# Patient Record
Sex: Female | Born: 1980
Health system: Southern US, Community
[De-identification: ages and names within clinical notes are randomized; demographics above are authoritative.]

## PROBLEM LIST (undated history)

## (undated) DIAGNOSIS — H6690 Otitis media, unspecified, unspecified ear: Secondary | ICD-10-CM

## (undated) DIAGNOSIS — G43909 Migraine, unspecified, not intractable, without status migrainosus: Secondary | ICD-10-CM

## (undated) DIAGNOSIS — F419 Anxiety disorder, unspecified: Secondary | ICD-10-CM

## (undated) DIAGNOSIS — F32A Depression, unspecified: Secondary | ICD-10-CM

## (undated) DIAGNOSIS — K219 Gastro-esophageal reflux disease without esophagitis: Secondary | ICD-10-CM

## (undated) DIAGNOSIS — O99210 Obesity complicating pregnancy, unspecified trimester: Secondary | ICD-10-CM

## (undated) DIAGNOSIS — Z87442 Personal history of urinary calculi: Secondary | ICD-10-CM

## (undated) DIAGNOSIS — A6000 Herpesviral infection of urogenital system, unspecified: Secondary | ICD-10-CM

## (undated) DIAGNOSIS — I10 Essential (primary) hypertension: Secondary | ICD-10-CM

## (undated) DIAGNOSIS — T7840XA Allergy, unspecified, initial encounter: Secondary | ICD-10-CM

## (undated) DIAGNOSIS — Z8489 Family history of other specified conditions: Secondary | ICD-10-CM

## (undated) DIAGNOSIS — F329 Major depressive disorder, single episode, unspecified: Secondary | ICD-10-CM

## (undated) HISTORY — DX: Migraine, unspecified, not intractable, without status migrainosus: G43.909

## (undated) HISTORY — PX: HERNIA REPAIR: SHX51

## (undated) HISTORY — DX: Obesity complicating pregnancy, unspecified trimester: O99.210

## (undated) HISTORY — DX: Gastro-esophageal reflux disease without esophagitis: K21.9

## (undated) HISTORY — DX: Morbid (severe) obesity due to excess calories: E66.01

## (undated) HISTORY — DX: Major depressive disorder, single episode, unspecified: F32.9

## (undated) HISTORY — DX: Herpesviral infection of urogenital system, unspecified: A60.00

## (undated) HISTORY — DX: Essential (primary) hypertension: I10

## (undated) HISTORY — DX: Anxiety disorder, unspecified: F41.9

## (undated) HISTORY — DX: Allergy, unspecified, initial encounter: T78.40XA

## (undated) HISTORY — DX: Depression, unspecified: F32.A

## (undated) HISTORY — PX: TUBAL LIGATION: SHX77

---

## 2008-10-23 ENCOUNTER — Ambulatory Visit: Payer: Self-pay | Admitting: Family Medicine

## 2008-10-23 DIAGNOSIS — A6000 Herpesviral infection of urogenital system, unspecified: Secondary | ICD-10-CM | POA: Insufficient documentation

## 2008-10-23 DIAGNOSIS — I10 Essential (primary) hypertension: Secondary | ICD-10-CM | POA: Insufficient documentation

## 2008-10-23 DIAGNOSIS — G43009 Migraine without aura, not intractable, without status migrainosus: Secondary | ICD-10-CM | POA: Insufficient documentation

## 2008-10-23 DIAGNOSIS — K219 Gastro-esophageal reflux disease without esophagitis: Secondary | ICD-10-CM | POA: Insufficient documentation

## 2008-11-24 ENCOUNTER — Ambulatory Visit: Payer: Self-pay | Admitting: Family Medicine

## 2009-03-03 ENCOUNTER — Ambulatory Visit: Payer: Self-pay | Admitting: Ophthalmology

## 2010-02-07 ENCOUNTER — Telehealth (INDEPENDENT_AMBULATORY_CARE_PROVIDER_SITE_OTHER): Payer: Self-pay | Admitting: *Deleted

## 2010-02-11 ENCOUNTER — Telehealth: Payer: Self-pay | Admitting: Family Medicine

## 2010-02-15 ENCOUNTER — Encounter: Payer: Self-pay | Admitting: Family Medicine

## 2010-02-15 ENCOUNTER — Other Ambulatory Visit: Payer: Self-pay | Admitting: Family Medicine

## 2010-02-15 ENCOUNTER — Ambulatory Visit (INDEPENDENT_AMBULATORY_CARE_PROVIDER_SITE_OTHER): Payer: Self-pay | Admitting: Family Medicine

## 2010-02-15 DIAGNOSIS — N949 Unspecified condition associated with female genital organs and menstrual cycle: Secondary | ICD-10-CM

## 2010-02-15 DIAGNOSIS — K219 Gastro-esophageal reflux disease without esophagitis: Secondary | ICD-10-CM

## 2010-02-15 DIAGNOSIS — N925 Other specified irregular menstruation: Secondary | ICD-10-CM

## 2010-02-15 DIAGNOSIS — I10 Essential (primary) hypertension: Secondary | ICD-10-CM

## 2010-02-17 NOTE — Progress Notes (Signed)
Summary: Methyldopa  Phone Note Refill Request Message from:  Fax from Pharmacy on February 07, 2010 4:57 PM  Refills Requested: Medication #1:  METHYLDOPA 250 MG TABS 1 by mouth two times a day. [address]  Phone:   [phone]   Method Requested: Electronic Initial call taken by: Delilah Shan CMA Duncan Dull),  February 07, 2010 4:58 PM  Follow-up for Phone Call        return ov needed, no ov in > 1 year Follow-up by: [name] MD,  February 07, 2010 5:13 PM  Additional Follow-up for Phone Call Additional follow up Details #1::        number disconnected so notified pharmacy and asked them to notified the patient.Consuello Masse CMA   Additional Follow-up by: Benny Lennert CMA Duncan Dull),  February 08, 2010 7:57 AM

## 2010-02-17 NOTE — Progress Notes (Signed)
Summary: wants refill on methyldopa  Phone Note From Pharmacy   Caller: [phone] Summary of Call: Patient is at pharmacy now. She is asking if she could get refill on her methyldopa, has been seen since 2010, is it okay to fill?  Initial call taken by: Melody Comas,  February 11, 2010 9:32 AM  Follow-up for Phone Call        See her primary MDs response on 2/6 OV note. Follow-up by: Kerby Nora MD,  February 11, 2010 9:57 AM  Additional Follow-up for Phone Call Additional follow up Details #1::        Pharmacy advised.Consuello Masse CMA   Additional Follow-up by: Benny Lennert CMA Duncan Dull),  February 11, 2010 10:01 AM

## 2010-02-23 NOTE — Assessment & Plan Note (Signed)
Summary: RENEW MEDS   Vital Signs:  Patient profile:   30 year old female Height:      61 inches Weight:      250.75 pounds BMI:     47.55 Temp:     98.7 degrees F oral Pulse rate:   80 / minute Pulse rhythm:   regular BP sitting:   140 / 90  (left arm) Cuff size:   regular  Vitals Entered By: Benny Lennert CMA Duncan Dull) (February 15, 2010 3:40 PM)  History of Present Illness: Chief complaint renew meds  HTN, moderate control on methyldopa (was placed on this because trying to get pregnant)... BP elevated borderline here today. per pt... at home 135/77.  Today she is 2 weeks late with menses today... U preg are neg.  Having some abdominal cramping, mild tenderness, mild breast tenderness, no N/V.  Last seen by Dr. Patsy Lager in 2010....last CPX at GYN  in 2010.   Also having daily.. burning in chest after mealss, cannot get food down sometime. Sour taste in mouth. Has tried generic zantac, prilosec 2 tabs two times a day, TUMs no relief. Used for 1 month at a time without relief.    Problems Prior to Update: 1)  Family History Diabetes 1st Degree Relative  (ICD-V18.0) 2)  Family History of Colon Ca 1st Degree Relative <60  (ICD-V16.0) 3)  Family History Breast Cancer 1st Degree Relative <50  (ICD-V16.3) 4)  Family History of Alcoholism/addiction  (ICD-V61.41) 5)  Genital Herpes  (ICD-054.10) 6)  Uti  (ICD-599.0) 7)  Common Migraine  (ICD-346.10) 8)  Hypertension  (ICD-401.9) 9)  Gerd  (ICD-530.81) 10)  Depression  (ICD-311) 11)  Chickenpox, Hx of  (ICD-V15.9)  Current Medications (verified): 1)  Methyldopa 250 Mg Tabs (Methyldopa) .Marland Kitchen.. 1 By Mouth Two Times A Day  Allergies: 1)  ! * Nyquil  Past History:  Past medical, surgical, family and social histories (including risk factors) reviewed, and no changes noted (except as noted below).  Past Medical History: Reviewed history from 10/23/2008 and no changes required. GENITAL HERPES  COMMON MIGRAINE HYPERTENSION    GERD DEPRESSION  CHICKENPOX, HX OF     Past Surgical History: Reviewed history from 10/23/2008 and no changes required. none  Family History: Reviewed history from 10/23/2008 and no changes required. Family History of Alcoholism/Addiction Family History of Arthritis Family History Breast cancer 1st degree relative <50 Family History of Colon CA 1st degree relative <60 Family History Diabetes 1st degree relative Family History High cholesterol Family History Hypertension Family History Lung cancer Family History of Prostate CA 1st degree relative <50 Family History of Cardiovascular disorder Bother has chrons disease and diverticulitis mom has diverticlosis grandmother has ulcerative colitis  Social History: Reviewed history from 10/23/2008 and no changes required. Occupation: Merchandiser, retail Married Alcohol use-no Drug use-no Regular exercise-no  Physical Exam  General:  obese appearing female in NAD  Mouth:  Oral mucosa and oropharynx without lesions or exudates.  Teeth in good repair. Neck:  no carotid bruit or thyromegaly no cervical or supraclavicular lymphadenopathy  Lungs:  Normal respiratory effort, chest expands symmetrically. Lungs are clear to auscultation, no crackles or wheezes. Heart:  Normal rate and regular rhythm. S1 and S2 normal without gallop, murmur, click, rub or other extra sounds. Abdomen:  Bowel sounds positive,abdomen soft and non-tender without masses, organomegaly or hernias noted. Pulses:  R and L posterior tibial pulses are full and equal bilaterally  Extremities:  no edema    Impression & Recommendations:  Problem # 1:  HYPERTENSION (ICD-401.9) Well controlled at home  (nervous today at office). Continue current medication.  Her updated medication list for this problem includes:    Methyldopa 250 Mg Tabs (Methyldopa) .Marland Kitchen... 1 by mouth two times a day  Problem # 2:  GERD (ICD-530.81) Poor control... counsled on trigger avoidance and  lifestyle chcanges. Start pantoprazole daily.  HAs failed 30 days of OTC meds.  Her updated medication list for this problem includes:    Pantoprazole Sodium 40 Mg Tbec (Pantoprazole sodium) .Marland Kitchen... 1 tab by mouth daily  Problem # 3:  DELAYED MENSES (ICD-626.8) Eval for pregnancy.  Orders: TLB-Preg Serum Quant (B-hCG) (84702-HCG-QN)  Complete Medication List: 1)  Methyldopa 250 Mg Tabs (Methyldopa) .Marland Kitchen.. 1 by mouth two times a day 2)  Pantoprazole Sodium 40 Mg Tbec (Pantoprazole sodium) .Marland Kitchen.. 1 tab by mouth daily  Patient Instructions: 1)  Schedule CPX with GYN in next few months. 2)   We will call with lab results. 3)   Start pantoprazole daily. 4)  Take for at least 4-6 weeks, then decrease to OTC omeprazole. 5)   If GERD not improving after 2 weeks.. call. 6)  Avoid spicy foods, citris, peppermint, caffeine, chocolate, 7)  tomatos. 8)   Exercise and weight loss. 9)    Eat smaller meals more frrequently.  Prescriptions: PANTOPRAZOLE SODIUM 40 MG TBEC (PANTOPRAZOLE SODIUM) 1 tab by mouth daily  #30 x 3   Entered and Authorized by:   Kerby Nora MD   Signed by:   Kerby Nora MD on 02/15/2010   Method used:   Electronically to        [facility] [address] (retail)       [address]       [place], [place]  [address]       Ph: [phone]       Fax: [phone]   RxID:   [id number] METHYLDOPA 250 MG TABS (METHYLDOPA) 1 by mouth two times a day  #90 x 3   Entered and Authorized by:   Kerby Nora MD   Signed by:   Kerby Nora MD on 02/15/2010   Method used:   Electronically to        [facility] [address] (retail)       [address]       [place], [place]  [address]       Ph: [phone]       Fax: [phone]   RxID:   [id number]    Orders Added: 1)  TLB-Preg Serum Quant (B-hCG) [84702-HCG-QN] 2)  Est. Patient Level IV [02542]    Current Allergies (reviewed today): ! *  NYQUIL

## 2010-10-17 ENCOUNTER — Other Ambulatory Visit: Payer: Self-pay | Admitting: Family Medicine

## 2010-11-14 ENCOUNTER — Other Ambulatory Visit: Payer: Self-pay | Admitting: *Deleted

## 2010-11-14 MED ORDER — METHYLDOPA 250 MG PO TABS: ORAL_TABLET | ORAL | Status: DC

## 2010-11-14 NOTE — Telephone Encounter (Signed)
Pt is asking for refills.  She was last seen in February.  She made an appt for 11/20 but will cancel that if not necessary.  Uses [facility] [address].

## 2010-11-14 NOTE — Telephone Encounter (Signed)
I am fine refilling w/o office visit in this time period, since stable

## 2010-11-22 ENCOUNTER — Ambulatory Visit: Payer: Self-pay | Admitting: Family Medicine

## 2011-11-21 ENCOUNTER — Other Ambulatory Visit: Payer: Self-pay | Admitting: Family Medicine

## 2011-11-23 ENCOUNTER — Other Ambulatory Visit: Payer: Self-pay

## 2011-11-23 NOTE — Telephone Encounter (Signed)
Pt request refill methyldopa to [address]; pt out of med 3 days. No symptoms, h/a dizziness or chest pain. Pt will call back to schedule appt after checks work schedule. Pt last seen 02/15/10.Please advise.

## 2011-11-23 NOTE — Telephone Encounter (Signed)
Pt left. 

## 2011-11-24 MED ORDER — METHYLDOPA 250 MG PO TABS: ORAL_TABLET | ORAL | Status: DC

## 2011-11-24 NOTE — Telephone Encounter (Signed)
PATIENT TO CALL BACK AND SCHEDULE CPX

## 2011-11-24 NOTE — Telephone Encounter (Signed)
Pt called back checking on status of Methyldopa. Spoke with Tiffany at pharmacy; getting med ready now and pt can pick up. Left pt v/m to ck with pharmacy.

## 2011-11-24 NOTE — Telephone Encounter (Signed)
Ok to refill #60, 0 refills  I have not seen in 3 years. Please set up cpx (30 min)

## 2011-11-27 ENCOUNTER — Ambulatory Visit (INDEPENDENT_AMBULATORY_CARE_PROVIDER_SITE_OTHER): Payer: PRIVATE HEALTH INSURANCE | Admitting: Family Medicine

## 2011-11-27 ENCOUNTER — Encounter: Payer: Self-pay | Admitting: Family Medicine

## 2011-11-27 VITALS — BP 130/90 | HR 97 | Temp 98.2°F | Ht 61.0 in | Wt 191.2 lb

## 2011-11-27 DIAGNOSIS — L089 Local infection of the skin and subcutaneous tissue, unspecified: Secondary | ICD-10-CM

## 2011-11-27 DIAGNOSIS — I10 Essential (primary) hypertension: Secondary | ICD-10-CM

## 2011-11-27 MED ORDER — METHYLDOPA 250 MG PO TABS: ORAL_TABLET | ORAL | Status: DC

## 2011-11-27 MED ORDER — CEPHALEXIN 500 MG PO CAPS: 1000.0000 mg | ORAL_CAPSULE | Freq: Two times a day (BID) | ORAL | Status: DC

## 2011-11-27 NOTE — Progress Notes (Signed)
   Nature conservation officer at [facility] [address] [place] [address] Phone: [phone] Fax: [phone]  Date:  11/27/2011   Name:  Lacey Rodgers   DOB:  Aug 14, 1980   MRN:  [id number] Gender: female Age: 31 y.o.  PCP:  [name], MD  Evaluating MD: [name], MD   Chief Complaint: Rash and Medication Refill   History of Present Illness:  Lacey Rodgers is a 31 y.o. pleasant patient who presents with the following:  BP meds: f/u BP meds, needs refill on meds, tolerating fine  Knot: on r side of head, somewhat painful to touch with surrounding LAD.  Patient Active Problem List  Diagnosis  . GENITAL HERPES  . DEPRESSION  . COMMON MIGRAINE  . HYPERTENSION  . GERD    Past Medical History  Diagnosis Date  . Genital herpes   . Migraine   . Hypertension   . GERD (gastroesophageal reflux disease)   . Depression     No past surgical history on file.  History  Substance Use Topics  . Smoking status: Unknown If Ever Smoked  . Smokeless tobacco: Not on file  . Alcohol Use: No    No family history on file.  Allergies  Allergen Reactions  . Pseudoeph-Doxylamine-Dm-Apap     Medication list has been reviewed and updated.  Outpatient Prescriptions Prior to Visit  Medication Sig Dispense Refill  . methyldopa (ALDOMET) 250 MG tablet Take one by mouth twice a day.  60 tablet  0   Last reviewed on 11/27/2011 11:09 AM by Consuello Masse, CMA  Review of Systems:  As above. No fever, chills, sweats.  Physical Examination: Filed Vitals:   11/27/11 1106  BP: 130/90  Pulse: 97  Temp: 98.2 F (36.8 C)  TempSrc: Oral  Height: 5\' 1"  (1.549 m)  Weight: 191 lb 4 oz (86.75 kg)  SpO2: 97%    Body mass index is 36.14 kg/(m^2). Ideal Body Weight: Weight in (lb) to have BMI = 25: 132    GEN: WDWN, NAD, Non-toxic, A & O x 3 HEENT: Atraumatic, Normocephalic. Neck supple. No masses, R LAD adjacent to ear and upper R LAD Ears and Nose: No  external deformity. SKIN: anterior to R ear, approx quarter sized tender pink elevated area. CV: RRR, No M/G/R. No JVD. No thrill. No extra heart sounds. PULM: CTA B, no wheezes, crackles, rhonchi. No retractions. No resp. distress. No accessory muscle use. EXTR: No c/c/e NEURO Normal gait.  PSYCH: Normally interactive. Conversant. Not depressed or anxious appearing.  Calm demeanor.    Assessment and Plan:  1. Skin infection   2. Hypertension    Skin infecction  Boil, keflex Refill meds  Orders Today:  No orders of the defined types were placed in this encounter.    Updated Medication List: (Includes new medications, updates to list, dose adjustments) Meds ordered this encounter  Medications  . methyldopa (ALDOMET) 250 MG tablet    Sig: Take one by mouth twice a day.    Dispense:  180 tablet    Refill:  3  . cephALEXin (KEFLEX) 500 MG capsule    Sig: Take 2 capsules (1,000 mg total) by mouth 2 (two) times daily.    Dispense:  40 capsule    Refill:  0    Medications Discontinued: Medications Discontinued During This Encounter  Medication Reason  . methyldopa (ALDOMET) 250 MG tablet Reorder     [name], MD

## 2012-03-20 ENCOUNTER — Encounter: Payer: Self-pay | Admitting: Family Medicine

## 2012-03-20 ENCOUNTER — Ambulatory Visit (INDEPENDENT_AMBULATORY_CARE_PROVIDER_SITE_OTHER)
Admission: RE | Admit: 2012-03-20 | Discharge: 2012-03-20 | Disposition: A | Discharge status: Home / Self Care | Payer: BC Managed Care – PPO | Source: Ambulatory Visit | Attending: Family Medicine | Admitting: Family Medicine

## 2012-03-20 ENCOUNTER — Ambulatory Visit (INDEPENDENT_AMBULATORY_CARE_PROVIDER_SITE_OTHER): Payer: BC Managed Care – PPO | Admitting: Family Medicine

## 2012-03-20 VITALS — BP 120/74 | HR 70 | Temp 97.4°F | Ht 61.0 in | Wt 187.8 lb

## 2012-03-20 DIAGNOSIS — IMO0002 Reserved for concepts with insufficient information to code with codable children: Secondary | ICD-10-CM

## 2012-03-20 MED ORDER — PREDNISONE 20 MG PO TABS: ORAL_TABLET | ORAL | Status: DC

## 2012-03-20 MED ORDER — DICLOFENAC SODIUM 75 MG PO TBEC: 75.0000 mg | DELAYED_RELEASE_TABLET | Freq: Two times a day (BID) | ORAL | Status: DC

## 2012-03-20 MED ORDER — TIZANIDINE HCL 4 MG PO TABS: 4.0000 mg | ORAL_TABLET | Freq: Every evening | ORAL | Status: DC

## 2012-03-20 NOTE — Progress Notes (Signed)
Patient Name: Lacey Rodgers Date of Birth: 1980-04-19 Medical Record Number: [id number] Gender: female  PCP: [name], MD  History of Present Illness:  Lacey Rodgers is a 32 y.o. very pleasant female patient who presents with the following: Back Pain  ongoing for approximately: 10 days The patient has had back pain before, but not like this. Left sided radiculopathy and decreased sensation on the Left lower extremity  Going to the gym since Jan  Diminished on the left LE sensation  Hip flexor and at knee on L, 4+/5  No bowel or bladder incontinence. ? focal weakness. Prior interventions: NSAIDS Physical therapy: No Chiropractic manipulations: No Acupuncture: No Osteopathic manipulation: No Heat or cold: Minimal effect  Past Medical History, Surgical History, Family History, Medications, Allergies have been reviewed and updated if relevant.  Review of Systems  GEN: No fevers, chills. Nontoxic. Primarily MSK c/o today. MSK: Detailed in the HPI GI: tolerating PO intake without difficulty Neuro: As above  Otherwise the pertinent positives of the ROS are noted above.    Physical Exam  Filed Vitals:   03/20/12 0931  BP: 120/74  Pulse: 70  Temp: 97.4 F (36.3 C)  TempSrc: Oral  Height: 5\' 1"  (1.549 m)  Weight: 187 lb 12 oz (85.163 kg)  SpO2: 98%    Gen: Well-developed,well-nourished,in no acute distress; alert,appropriate and cooperative throughout examination HEENT: Normocephalic and atraumatic without obvious abnormalities.  Ears, externally no deformities Pulm: Breathing comfortably in no respiratory distress Range of motion at  the waist: Flexion, rotation and lateral bending: moderate restriction forward flexion  No echymosis or edema Rises to examination table with no difficulty Gait: minimally antalgic  Inspection/Deformity: No abnormality Paraspinus T:  TTP L4-s1  B Ankle Dorsiflexion (L5,4): 5/5 B Great Toe Dorsiflexion (L5,4): 5/5 Heel  Walk (L5): WNL Toe Walk (S1): WNL Rise/Squat (L4): WNL, mild pain  HIP Flexion: 4+/5 Knee extension: 4+/5  SENSORY B Medial Foot (L4): decreased on L B Dorsum (L5): decreased on L B Lateral (S1): decreased on L Light Touch: decreased Pinprick: decreased  REFLEXES Knee (L4): 2+ Ankle (S1): 2+  B SLR, seated: neg B SLR, supine: pos B Greater Troch: NT B Log Roll: neg B Stork: NT B Sciatic Notch: NT  Dg Lumbar Spine Complete  03/20/2012  *RADIOLOGY REPORT*  Clinical Data: Back pain.  LUMBAR SPINE - COMPLETE 4+ VIEW  Comparison: None  Findings: There is a left convex lumbar scoliosis estimated at 16 degrees.  Normal alignment on the lateral film.  The facets are normally aligned.  No pars defects.  The visualized bony pelvis is intact.  IMPRESSION:  1.  Left convex lumbar scoliosis. 2.  No acute bony findings or significant degenerative changes.   Original Report Authenticated By: Rudie Meyer, M.D.     Assessment and Plan:     Lumbar radiculopathy, acute - Plan: DG Lumbar Spine Complete  Numbness of foot  Lumbar radiculopathy with numbness and some change in strength. 14 day prednisone burst and taper, then NSAIDS, start muscle relaxants and gentle stretching.  Close f/u in 1 mo.  Orders Today:  Orders Placed This Encounter  Procedures  . DG Lumbar Spine Complete    Standing Status: Future     Number of Occurrences: 1     Standing Expiration Date: 05/20/2013    Order Specific Question:  Reason for Exam (SYMPTOM  OR DIAGNOSIS REQUIRED)    Answer:  BACK PAIN    Order Specific Question:  Is the patient pregnant?  Answer:  No    Order Specific Question:  Preferred imaging location?    Answer:  [facility]    Updated Medication List: (Includes new medications, updates to list, dose adjustments) Meds ordered this encounter  Medications  . predniSONE (DELTASONE) 20 MG tablet    Sig: 2 tabs po daily for 1 week, then 1 tab po daily    Dispense:  21 tablet     Refill:  0  . diclofenac (VOLTAREN) 75 MG EC tablet    Sig: Take 1 tablet (75 mg total) by mouth 2 (two) times daily.    Dispense:  60 tablet    Refill:  3  . tiZANidine (ZANAFLEX) 4 MG tablet    Sig: Take 1 tablet (4 mg total) by mouth Nightly.    Dispense:  30 tablet    Refill:  2    Medications Discontinued: Medications Discontinued During This Encounter  Medication Reason  . cephALEXin (KEFLEX) 500 MG capsule Error

## 2012-03-20 NOTE — Patient Instructions (Addendum)
F/u 4-5 weeks

## 2012-04-17 ENCOUNTER — Ambulatory Visit: Payer: BC Managed Care – PPO | Admitting: Family Medicine

## 2012-04-24 ENCOUNTER — Ambulatory Visit (INDEPENDENT_AMBULATORY_CARE_PROVIDER_SITE_OTHER): Payer: BC Managed Care – PPO | Admitting: Family Medicine

## 2012-04-24 ENCOUNTER — Encounter: Payer: Self-pay | Admitting: Family Medicine

## 2012-04-24 VITALS — BP 120/84 | HR 85 | Temp 99.0°F | Ht 61.0 in | Wt 186.5 lb

## 2012-04-24 DIAGNOSIS — M549 Dorsalgia, unspecified: Secondary | ICD-10-CM

## 2012-04-24 NOTE — Progress Notes (Signed)
Nature conservation officer at [facility] [address] [place] [address] Phone: [phone] Fax: [phone]  Date:  04/24/2012   Name:  Lacey Rodgers   DOB:  Feb 02, 1980   MRN:  [id number] Gender: female Age: 32 y.o.  Primary Physician:  [name], MD  Evaluating MD: [name], MD   Chief Complaint: Follow-up   History of Present Illness:  Lacey Rodgers is a 32 y.o. pleasant patient who presents with the following:  F/u lumbar radiculopathy, s/p 14 d prednisone course.  Doing much better Asymptomatic Stopped all meds Working out ---- 70 pound weight loss.  Patient Active Problem List  Diagnosis  . GENITAL HERPES  . DEPRESSION  . COMMON MIGRAINE  . HYPERTENSION  . GERD    Past Medical History  Diagnosis Date  . Genital herpes   . Migraine   . Hypertension   . GERD (gastroesophageal reflux disease)   . Depression     No past surgical history on file.  History   Social History  . Marital Status: Married    Spouse Name: N/A    Number of Children: N/A  . Years of Education: N/A   Occupational History  . Not on file.   Social History Main Topics  . Smoking status: Unknown If Ever Smoked  . Smokeless tobacco: Not on file  . Alcohol Use: No  . Drug Use: No  . Sexually Active: Not on file   Other Topics Concern  . Not on file   Social History Narrative  . No narrative on file    No family history on file.  Allergies  Allergen Reactions  . Pseudoeph-Doxylamine-Dm-Apap     Medication list has been reviewed and updated.  Outpatient Prescriptions Prior to Visit  Medication Sig Dispense Refill  . methyldopa (ALDOMET) 250 MG tablet Take one by mouth twice a day.  180 tablet  3  . diclofenac (VOLTAREN) 75 MG EC tablet Take 1 tablet (75 mg total) by mouth 2 (two) times daily.  60 tablet  3  . tiZANidine (ZANAFLEX) 4 MG tablet Take 1 tablet (4 mg total) by mouth Nightly.  30 tablet  2  . predniSONE (DELTASONE) 20 MG tablet 2 tabs  po daily for 1 week, then 1 tab po daily  21 tablet  0   No facility-administered medications prior to visit.    Review of Systems:   GEN: No fevers, chills. Nontoxic. Primarily MSK c/o today. MSK: Detailed in the HPI GI: tolerating PO intake without difficulty Neuro: No numbness, parasthesias, or tingling associated. Otherwise the pertinent positives of the ROS are noted above.    Physical Examination: BP 120/84  Pulse 85  Temp(Src) 99 F (37.2 C) (Oral)  Ht 5\' 1"  (1.549 m)  Wt 186 lb 8 oz (84.596 kg)  BMI 35.26 kg/m2  SpO2 97%  Ideal Body Weight: Weight in (lb) to have BMI = 25: 132   GEN: Well-developed,well-nourished,in no acute distress; alert,appropriate and cooperative throughout examination HEENT: Normocephalic and atraumatic without obvious abnormalities. Ears, externally no deformities PULM: Breathing comfortably in no respiratory distress EXT: No clubbing, cyanosis, or edema PSYCH: Normally interactive. Cooperative during the interview. Pleasant. Friendly and conversant. Not anxious or depressed appearing. Normal, full affect.  Range of motion at  the waist: Flexion: normal Extension: normal Lateral bending: normal Rotation: all normal  No echymosis or edema Rises to examination table with no difficulty Gait: non antalgic  Inspection/Deformity: N Paraspinus Tenderness: nt  B Ankle Dorsiflexion (L5,4):  5/5 B Great Toe Dorsiflexion (L5,4): 5/5 Heel Walk (L5): WNL Toe Walk (S1): WNL Rise/Squat (L4): WNL  SENSORY B Medial Foot (L4): WNL B Dorsum (L5): WNL B Lateral (S1): WNL Light Touch: WNL Pinprick: WNL -- slightly decreased at L 1st and 2nd web space  B SLR, seated: neg B Greater Troch: NT B Log Roll: neg B Stork: NT B Sciatic Notch: NT   Assessment and Plan:  Back pain  Much improved, work on core and overall fitness  Orders Today:  No orders of the defined types were placed in this encounter.    Updated Medication List: (Includes  new medications, updates to list, dose adjustments) No orders of the defined types were placed in this encounter.    Medications Discontinued: Medications Discontinued During This Encounter  Medication Reason  . predniSONE (DELTASONE) 20 MG tablet Error      Signed, Kaegan Hettich T. Gerado Nabers, MD 04/24/2012 8:21 AM

## 2012-10-09 ENCOUNTER — Encounter: Payer: Self-pay | Admitting: Family Medicine

## 2012-10-09 ENCOUNTER — Ambulatory Visit (INDEPENDENT_AMBULATORY_CARE_PROVIDER_SITE_OTHER): Payer: BC Managed Care – PPO | Admitting: Family Medicine

## 2012-10-09 VITALS — BP 110/78 | HR 85 | Temp 98.7°F | Ht 61.0 in | Wt 199.0 lb

## 2012-10-09 DIAGNOSIS — G43509 Persistent migraine aura without cerebral infarction, not intractable, without status migrainosus: Secondary | ICD-10-CM

## 2012-10-09 NOTE — Patient Instructions (Signed)
REFERRAL: GO THE THE FRONT ROOM AT THE ENTRANCE OF OUR CLINIC, NEAR CHECK IN. ASK FOR MARION. SHE WILL HELP YOU SET UP YOUR REFERRAL. DATE: TIME:  

## 2012-10-09 NOTE — Progress Notes (Signed)
Nature conservation officer at [facility] [address] [place] [address] Phone: [phone] Fax: [phone]  Date:  10/09/2012   Name:  Lacey Rodgers   DOB:  1980-04-24   MRN:  [id number] Gender: female Age: 32 y.o.  Primary Physician:  [name], MD   Chief Complaint: Migraine   History of Present Illness:  Lacey Rodgers is a 32 y.o. pleasant patient who presents with the following:  Actively trying to get pregnant. Ongoing now with persistent, severe limiting migraines. She has had them a very long time, but they have been worse over the last few months.  HA - light bothering her about all the time.  Last week a migraine almost every day.  excedrin not helping.   Migraine at  Light and sound bother.  Sometimes aura.   [employer] and [employer].   Patient Active Problem List   Diagnosis Date Noted  . GENITAL HERPES 10/23/2008  . DEPRESSION 10/23/2008  . COMMON MIGRAINE 10/23/2008  . HYPERTENSION 10/23/2008  . GERD 10/23/2008    Past Medical History  Diagnosis Date  . Genital herpes   . Migraine   . Hypertension   . GERD (gastroesophageal reflux disease)   . Depression     No past surgical history on file.  History   Social History  . Marital Status: Married    Spouse Name: N/A    Number of Children: N/A  . Years of Education: N/A   Occupational History  . Not on file.   Social History Main Topics  . Smoking status: Never Smoker   . Smokeless tobacco: Not on file  . Alcohol Use: No  . Drug Use: No  . Sexual Activity: Not on file   Other Topics Concern  . Not on file   Social History Narrative  . No narrative on file    No family history on file.  Allergies  Allergen Reactions  . Pseudoeph-Doxylamine-Dm-Apap     Medication list has been reviewed and updated.  Outpatient Prescriptions Prior to Visit  Medication Sig Dispense Refill  . methyldopa (ALDOMET) 250 MG tablet Take one by mouth twice a day.  180  tablet  3  . diclofenac (VOLTAREN) 75 MG EC tablet Take 1 tablet (75 mg total) by mouth 2 (two) times daily.  60 tablet  3  . tiZANidine (ZANAFLEX) 4 MG tablet Take 1 tablet (4 mg total) by mouth Nightly.  30 tablet  2   No facility-administered medications prior to visit.    Review of Systems:   GEN: No acute illnesses, no fevers, chills. GI: No n/v/d, eating normally Pulm: No SOB Interactive and getting along well at home.  Otherwise, ROS is as per the HPI.   Physical Examination: BP 110/78  Pulse 85  Temp(Src) 98.7 F (37.1 C) (Oral)  Ht 5\' 1"  (1.549 m)  Wt 199 lb (90.266 kg)  BMI 37.62 kg/m2  SpO2 98%  LMP 09/11/2012  Ideal Body Weight: Weight in (lb) to have BMI = 25: 132   GEN: WDWN, NAD, Non-toxic, A & O x 3 HEENT: Atraumatic, Normocephalic. Neck supple. No masses, No LAD. Ears and Nose: No external deformity. CV: RRR, No M/G/R. No JVD. No thrill. No extra heart sounds. PULM: CTA B, no wheezes, crackles, rhonchi. No retractions. No resp. distress. No accessory muscle use. ABD: S, NT, ND, +BS. No rebound tenderness. No HSM.  EXTR: No c/c/e  Neuro: CN 2-12 grossly intact. PERRLA. EOMI. Sensation intact throughout.  Str 5/5 all extremities. DTR 2+. No clonus. A and o x 4. Romberg neg. Finger nose neg. Heel -shin neg.   PSYCH: Normally interactive. Conversant. Not depressed or anxious appearing.  Calm demeanor.     Assessment and Plan:  Migraine aura, persistent - Plan: Ambulatory referral to Neurology  Challenging. I am not sure about what to do to help that is safe with pregnancy. Discussed with one of my partners, too, and I think discussing the neuro is the best option.  Orders Today:  Orders Placed This Encounter  Procedures  . Ambulatory referral to Neurology    Referral Priority:  Routine    Referral Type:  Consultation    Referral Reason:  Specialty Services Required    Requested Specialty:  Neurology    Number of Visits Requested:  1    Updated  Medication List: (Includes new medications, updates to list, dose adjustments) No orders of the defined types were placed in this encounter.    Medications Discontinued: Medications Discontinued During This Encounter  Medication Reason  . diclofenac (VOLTAREN) 75 MG EC tablet Patient has not taken in last 30 days  . tiZANidine (ZANAFLEX) 4 MG tablet Patient has not taken in last 30 days      Signed,  Austyn Perriello T. Tiauna Whisnant, MD

## 2012-10-10 DIAGNOSIS — G43509 Persistent migraine aura without cerebral infarction, not intractable, without status migrainosus: Secondary | ICD-10-CM | POA: Insufficient documentation

## 2012-12-05 ENCOUNTER — Other Ambulatory Visit: Payer: Self-pay | Admitting: Family Medicine

## 2013-01-02 HISTORY — PX: DILATION AND CURETTAGE OF UTERUS: SHX78

## 2013-01-06 ENCOUNTER — Emergency Department: Payer: Self-pay | Admitting: Emergency Medicine

## 2013-01-06 LAB — CBC
HCT: 44.3 % (ref 35.0–47.0)
HGB: 14.8 g/dL (ref 12.0–16.0)
MCH: 29.8 pg (ref 26.0–34.0)
MCHC: 33.5 g/dL (ref 32.0–36.0)
MCV: 89 fL (ref 80–100)
Platelet: 260 10*3/uL (ref 150–440)
RBC: 4.98 10*6/uL (ref 3.80–5.20)
RDW: 12.7 % (ref 11.5–14.5)
WBC: 17.3 10*3/uL — ABNORMAL HIGH (ref 3.6–11.0)

## 2013-01-06 LAB — URINALYSIS, COMPLETE
Bilirubin,UR: NEGATIVE
GLUCOSE, UR: NEGATIVE mg/dL (ref 0–75)
KETONE: NEGATIVE
NITRITE: NEGATIVE
PH: 7 (ref 4.5–8.0)
Protein: NEGATIVE
RBC,UR: 39 /HPF (ref 0–5)
SPECIFIC GRAVITY: 1.01 (ref 1.003–1.030)
Squamous Epithelial: <1
WBC UR: 3 /HPF (ref 0–5)

## 2013-01-06 LAB — HCG, QUANTITATIVE, PREGNANCY: Beta Hcg, Quant.: 106559 m[IU]/mL — ABNORMAL HIGH

## 2013-01-07 ENCOUNTER — Ambulatory Visit: Payer: Self-pay | Admitting: Obstetrics and Gynecology

## 2013-01-07 LAB — CBC
HCT: 41.2 % (ref 35.0–47.0)
HGB: 13.8 g/dL (ref 12.0–16.0)
MCH: 29.8 pg (ref 26.0–34.0)
MCHC: 33.5 g/dL (ref 32.0–36.0)
MCV: 89 fL (ref 80–100)
Platelet: 266 10*3/uL (ref 150–440)
RBC: 4.64 10*6/uL (ref 3.80–5.20)
RDW: 12.5 % (ref 11.5–14.5)
WBC: 17.4 10*3/uL — ABNORMAL HIGH (ref 3.6–11.0)

## 2013-01-07 LAB — WET PREP, GENITAL

## 2013-01-07 LAB — GC/CHLAMYDIA PROBE AMP

## 2013-01-09 ENCOUNTER — Ambulatory Visit: Payer: Self-pay | Admitting: Obstetrics and Gynecology

## 2013-01-10 LAB — PATHOLOGY REPORT

## 2013-02-21 ENCOUNTER — Ambulatory Visit: Payer: Self-pay | Admitting: Obstetrics and Gynecology

## 2013-02-24 ENCOUNTER — Ambulatory Visit: Payer: Self-pay | Admitting: Obstetrics and Gynecology

## 2013-02-26 LAB — PATHOLOGY REPORT

## 2013-09-11 ENCOUNTER — Other Ambulatory Visit: Payer: Self-pay | Admitting: Family Medicine

## 2013-09-11 NOTE — Telephone Encounter (Signed)
Last office visit 10/09/2012.  Ok to refill?

## 2013-09-12 NOTE — Telephone Encounter (Signed)
I should see her in f/u in the next few months.   If she has an OB doing GYN exams, I am ok just seeing for a  15 min ov. If none, we should do a cpx

## 2013-10-09 ENCOUNTER — Ambulatory Visit: Payer: Self-pay | Admitting: Obstetrics and Gynecology

## 2013-10-15 ENCOUNTER — Ambulatory Visit: Payer: BC Managed Care – PPO | Admitting: Family Medicine

## 2013-10-16 ENCOUNTER — Ambulatory Visit: Payer: BC Managed Care – PPO | Admitting: Family Medicine

## 2013-11-05 ENCOUNTER — Ambulatory Visit: Payer: BC Managed Care – PPO | Admitting: Family Medicine

## 2013-11-12 ENCOUNTER — Ambulatory Visit (INDEPENDENT_AMBULATORY_CARE_PROVIDER_SITE_OTHER): Payer: BC Managed Care – PPO | Admitting: Family Medicine

## 2013-11-12 ENCOUNTER — Encounter: Payer: Self-pay | Admitting: Family Medicine

## 2013-11-12 VITALS — BP 118/80 | HR 81 | Temp 97.9°F | Ht 60.25 in | Wt 214.8 lb

## 2013-11-12 DIAGNOSIS — I10 Essential (primary) hypertension: Secondary | ICD-10-CM

## 2013-11-12 DIAGNOSIS — Z23 Encounter for immunization: Secondary | ICD-10-CM

## 2013-11-12 MED ORDER — METHYLDOPA 250 MG PO TABS: 250.0000 mg | ORAL_TABLET | Freq: Two times a day (BID) | ORAL | Status: DC

## 2013-11-12 NOTE — Addendum Note (Signed)
Addended by: Damita LackLORING, Kaydenn Mclear S on: 11/12/2013 11:43 AM   Modules accepted: Orders

## 2013-11-12 NOTE — Progress Notes (Signed)
   Dr. Karleen HampshireSpencer T. Coyt Govoni, MD, CAQ Sports Medicine Primary [facility] and Sports Medicine [address] [place], [id number] Phone: [phone] Fax: [phone]  11/12/2013  Patient: Lacey BorgStephanie Rodgers, MRN: [id number], DOB: 1980-02-08, 33 y.o.  Primary Physician:  Hannah BeatSpencer Braeden Dolinski, MD  Chief Complaint: Medication Refill  Subjective:   Lacey Rodgers is a 33 y.o. very pleasant female patient who presents with the following:  Cough - rattling in her chest.   Flu shot.  HTN: Tolerating all medications without side effects Stable and at goal No CP, no sob. No HA.  BP Readings from Last 3 Encounters:  11/12/13 118/80  10/09/12 110/78  04/24/12 120/84   Lost miscarriage in 01/2013 - on SSRI and rare xanax now  Past Medical History, Surgical History, Social History, Family History, Problem List, Medications, and Allergies have been reviewed and updated if relevant.   GEN: as above. GI: No n/v/d, eating normally Pulm: No SOB Interactive and getting along well at home.  Otherwise, ROS is as per the HPI.  Objective:   BP 118/80 mmHg  Pulse 81  Temp(Src) 97.9 F (36.6 C) (Oral)  Ht 5' 0.25" (1.53 m)  Wt 214 lb 12 oz (97.41 kg)  BMI 41.61 kg/m2  LMP 11/06/2013 (Exact Date)   GEN: WDWN, NAD, Non-toxic, Alert & Oriented x 3 HEENT: Atraumatic, Normocephalic.  Ears and Nose: No external deformity. CV: RRR, no m/g/r  PULM: Normal respiratory rate, no accessory muscle use. No wheezes, crackles or rhonchi  EXTR: No clubbing/cyanosis/edema NEURO: Normal gait.  PSYCH: Normally interactive. Conversant. Not depressed or anxious appearing.  Calm demeanor.   Laboratory and Imaging Data:  Assessment and Plan:   Essential hypertension  Stable, refill meds  counselled about miscarriage, doing ok Lungs clear - viral bronchitis resolving  Follow-up: No Follow-up on file.  New Prescriptions   No medications on file   No orders of the defined types were placed in this  encounter.    Signed,  Elpidio GaleaSpencer T. Brityn Mastrogiovanni, MD   Patient's Medications  New Prescriptions   No medications on file  Previous Medications   ALPRAZOLAM (XANAX) 0.5 MG TABLET    Take 0.25 mg by mouth at bedtime as needed.    SERTRALINE (ZOLOFT) 50 MG TABLET    Take 50 mg by mouth daily.   Modified Medications   Modified Medication Previous Medication   METHYLDOPA (ALDOMET) 250 MG TABLET methyldopa (ALDOMET) 250 MG tablet      Take 1 tablet (250 mg total) by mouth 2 (two) times daily.    TAKE ONE TABLET BY MOUTH TWICE DAILY  Discontinued Medications   No medications on file

## 2013-11-12 NOTE — Progress Notes (Signed)
Pre visit review using our clinic review tool, if applicable. No additional management support is needed unless otherwise documented below in the visit note. 

## 2013-11-17 ENCOUNTER — Telehealth: Payer: Self-pay | Admitting: Family Medicine

## 2013-11-17 NOTE — Telephone Encounter (Signed)
emmi emailed °

## 2014-03-10 ENCOUNTER — Other Ambulatory Visit: Payer: Self-pay | Admitting: Family Medicine

## 2014-04-25 NOTE — Op Note (Signed)
PATIENT NAME:  Lacey BorgSCOTT, [name] MR#:  [id number] DATE OF BIRTH:  07-Aug-1980  DATE OF PROCEDURE:  01/09/2013  PREOPERATIVE DIAGNOSIS: Incomplete abortion.   POSTOPERATIVE DIAGNOSIS: Incomplete abortion.   OPERATION PERFORMED: Suction dilatation and curettage.   ANESTHESIA USED: General.   PRIMARY SURGEON: Florina OuAndreas M. Bonney AidStaebler, M.D.   ESTIMATED BLOOD LOSS: 100 mL.    OPERATIVE FLUIDS: 400 mL of crystalloid.   URINE OUTPUT: 30 mL of clear urine straight cath prior to beginning of the case.   COMPLICATIONS: None.   INTRAOPERATIVE FINDINGS: An 8 week size uterus. Cervix dilated 1 cm. Sounded to 11 cm. Suction curettage with a size 8 flexible curette, removing a moderate amount of tissue. Good uterine cri was noted following the suction curettage on sharp D and C. No active bleeding was noted at the conclusion of the case.   SPECIMENS REMOVED: Products of conception.   PREOPERATIVE ANTIBIOTICS: Doxycycline 100 mg.   PATIENT CONDITION FOLLOWING PROCEDURE: Stable.   PROCEDURE IN DETAIL: The patient had previously been diagnosed with a 12 week missed AB which she passed yesterday morning. She presented to the office for repeat ultrasound to verify completion of her miscarriage; however, ultrasound revealed a greater than 3 cm stripe consistent with retained products of conception and continued bleeding. The patient was, therefore, taken to the operating room as originally planned for suction dilatation and curettage. She was administered general endotracheal anesthesia, prepped and draped in the usual sterile fashion and positioned in the dorsal lithotomy position using Allen stirrups. Attention was turned to the patient's pelvis. The bladder was straight cathed for 30 mL of clear urine. An operative speculum was then placed. The anterior lip of the cervix was grasped with a single-tooth tenaculum, and the cervix was found to be already sufficiently dilated to allow passage of the size 8 suction  curette. Prior to passage of the curette, the uterus was sounded to 11 cm. Several passes with the suction curette were undertaken before a final pass with the sharp curette revealed some retained products on the patient's left aspect of the uterus. Following the sharp curette curettage, 1 final pass with the suction curette was undertaken. The single-tooth tenaculum was removed. The cervix and tenaculum sites were inspected and noted to be hemostatic. Sponge, needle and instrument counts were correct x 2. The patient tolerated the procedure well and was taken to the recovery room in stable condition.   ____________________________ Florina OuAndreas M. Bonney AidStaebler, MD ams:gb D: 01/09/2013 18:34:30 ET T: 01/09/2013 23:57:28 ET JOB#: [id number]  cc: Florina OuAndreas M. Bonney AidStaebler, MD, <Dictator> Carmel SacramentoANDREAS Cathrine MusterM Brizza Nathanson MD ELECTRONICALLY SIGNED 01/23/2013 8:54

## 2014-11-11 ENCOUNTER — Encounter: Payer: Self-pay | Admitting: Family Medicine

## 2014-11-11 ENCOUNTER — Ambulatory Visit (INDEPENDENT_AMBULATORY_CARE_PROVIDER_SITE_OTHER): Payer: 59 | Admitting: Family Medicine

## 2014-11-11 VITALS — BP 120/70 | HR 87 | Temp 99.0°F | Ht 60.25 in | Wt 221.5 lb

## 2014-11-11 DIAGNOSIS — M5010 Cervical disc disorder with radiculopathy, unspecified cervical region: Secondary | ICD-10-CM

## 2014-11-11 MED ORDER — PREDNISONE 20 MG PO TABS: ORAL_TABLET | ORAL | Status: DC

## 2014-11-11 MED ORDER — CYCLOBENZAPRINE HCL 10 MG PO TABS: 5.0000 mg | ORAL_TABLET | Freq: Three times a day (TID) | ORAL | Status: DC | PRN

## 2014-11-11 MED ORDER — DICLOFENAC SODIUM 75 MG PO TBEC: 75.0000 mg | DELAYED_RELEASE_TABLET | Freq: Two times a day (BID) | ORAL | Status: DC

## 2014-11-11 NOTE — Progress Notes (Signed)
Pre visit review using our clinic review tool, if applicable. No additional management support is needed unless otherwise documented below in the visit note. 

## 2014-11-11 NOTE — Progress Notes (Signed)
Dr. Karleen Hampshire T. Clemence Stillings, MD, CAQ Sports Medicine Primary Care and Sports Medicine [address] [place], [address] Phone: [phone] Fax: [phone]  11/11/2014  Patient: Lacey Rodgers, MRN: [id number], DOB: 16-Jul-1980, 34 y.o.  Primary Physician:  [name], MD   Chief Complaint  Patient presents with  . Neck/Shoulder Pain    radiates down left arm and hand goes numb   Subjective:   Lacey Rodgers is a 34 y.o. very pleasant female patient who presents with the following:  Pain at the base of her neck and pain going down her L arm and excruciating down her elbow. She does not have any specific known injury or trauma.  No significant long-standing neck problems.  No history of neck surgery in the past.  All this is been going on about 2 weeks.  She is having some numbness and some change in her grip on the left side.  She is also having radicular pain and shoulder blade pain.  About 2 weeks.  ? Numb and not sure.  Grip decreased - will go numb carrying boxes.    Past Medical History, Surgical History, Social History, Family History, Problem List, Medications, and Allergies have been reviewed and updated if relevant.  Patient Active Problem List   Diagnosis Date Noted  . Migraine aura, persistent 10/10/2012  . GENITAL HERPES 10/23/2008  . DEPRESSION 10/23/2008  . COMMON MIGRAINE 10/23/2008  . Essential hypertension 10/23/2008  . GERD 10/23/2008    Past Medical History  Diagnosis Date  . Genital herpes   . Migraine   . Hypertension   . GERD (gastroesophageal reflux disease)   . Depression     No past surgical history on file.  Social History   Social History  . Marital Status: Married    Spouse Name: N/A  . Number of Children: N/A  . Years of Education: N/A   Occupational History  . Not on file.   Social History Main Topics  . Smoking status: Never Smoker   . Smokeless tobacco: Never Used  . Alcohol Use: 0.0 oz/week    0 Standard drinks  or equivalent per week     Comment: occ  . Drug Use: No  . Sexual Activity: Not on file   Other Topics Concern  . Not on file   Social History Narrative    No family history on file.  Allergies  Allergen Reactions  . Pseudoeph-Doxylamine-Dm-Apap     Medication list reviewed and updated in full in [facility].  GEN: no acute illness or fever CV: No chest pain or shortness of breath MSK: detailed above Neuro: neurological signs are described above ROS O/w per HPI  Objective:   BP 120/70 mmHg  Pulse 87  Temp(Src) 99 F (37.2 C) (Oral)  Ht 5' 0.25" (1.53 m)  Wt 221 lb 8 oz (100.472 kg)  BMI 42.92 kg/m2  LMP 10/14/2014   GEN: Well-developed,well-nourished,in no acute distress; alert,appropriate and cooperative throughout examination HEENT: Normocephalic and atraumatic without obvious abnormalities. Ears, externally no deformities PULM: Breathing comfortably in no respiratory distress EXT: No clubbing, cyanosis, or edema PSYCH: Normally interactive. Cooperative during the interview. Pleasant. Friendly and conversant. Not anxious or depressed appearing. Normal, full affect.  CERVICAL SPINE EXAM Range of motion: Flexion, extension, lateral bending, and rotation: approx 35% loss of motion Pain with terminal motion: yes Spinous Processes: NT SCM: NT Upper paracervical muscles: TTP Upper traps: TTP C5-T1 intact, sensation and motor, with exception of decreased grip on  the L and mild decreased pinprick sensation in the left hand   Radiology: No results found.   Assessment and Plan:   Cervical disc disorder with radiculopathy of cervical region  Reviewed ROM, HEP, trial of some traction  Follow-up: 3-4 weeks  New Prescriptions   CYCLOBENZAPRINE (FLEXERIL) 10 MG TABLET    Take 0.5-1 tablets (5-10 mg total) by mouth 3 (three) times daily as needed for muscle spasms.   DICLOFENAC (VOLTAREN) 75 MG EC TABLET    Take 1 tablet (75 mg total) by mouth 2 (two) times  daily.   PREDNISONE (DELTASONE) 20 MG TABLET    2 tabs po for 5 days, then 1 tab po for 5 days   Signed,  Chealsey Miyamoto T. Valynn Schamberger, MD   Patient's Medications  New Prescriptions   CYCLOBENZAPRINE (FLEXERIL) 10 MG TABLET    Take 0.5-1 tablets (5-10 mg total) by mouth 3 (three) times daily as needed for muscle spasms.   DICLOFENAC (VOLTAREN) 75 MG EC TABLET    Take 1 tablet (75 mg total) by mouth 2 (two) times daily.   PREDNISONE (DELTASONE) 20 MG TABLET    2 tabs po for 5 days, then 1 tab po for 5 days  Previous Medications   ALPRAZOLAM (XANAX) 0.5 MG TABLET    Take 0.25 mg by mouth at bedtime as needed.    METHYLDOPA (ALDOMET) 250 MG TABLET    Take 1 tablet (250 mg total) by mouth 2 (two) times daily.   SERTRALINE (ZOLOFT) 50 MG TABLET    Take 50 mg by mouth daily.   Modified Medications   No medications on file  Discontinued Medications   No medications on file

## 2015-01-07 IMAGING — US US BREAST*L* LIMITED INC AXILLA
1 series · 4 of 4 positions shown · non-contrast
Comparison: 02/24/2013, 02/21/2013

CLINICAL DATA: 33-year-old female for six-month follow-up of left
breast biopsy

EXAM:
DIGITAL DIAGNOSTIC  LEFT MAMMOGRAM WITH CAD
ULTRASOUND LEFT BREAST

[Series 1: us breast*left* limited inc axilla · 0.08mm/px · 4 of 4 slices shown]
[im 1/4]
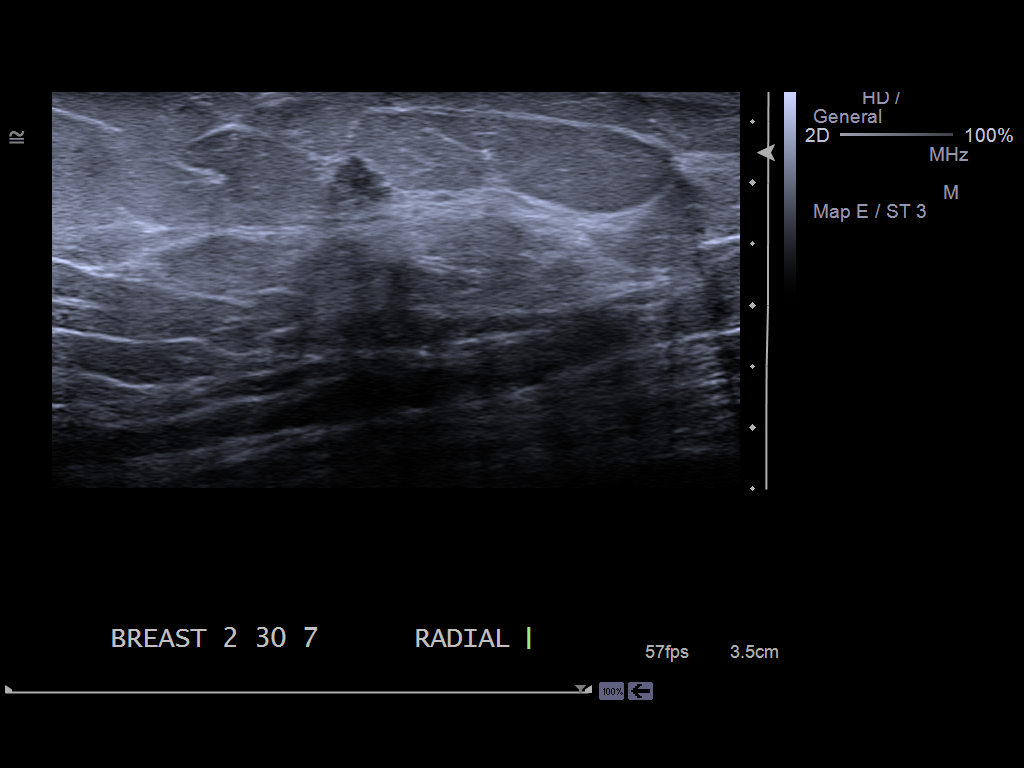
[im 2/4]
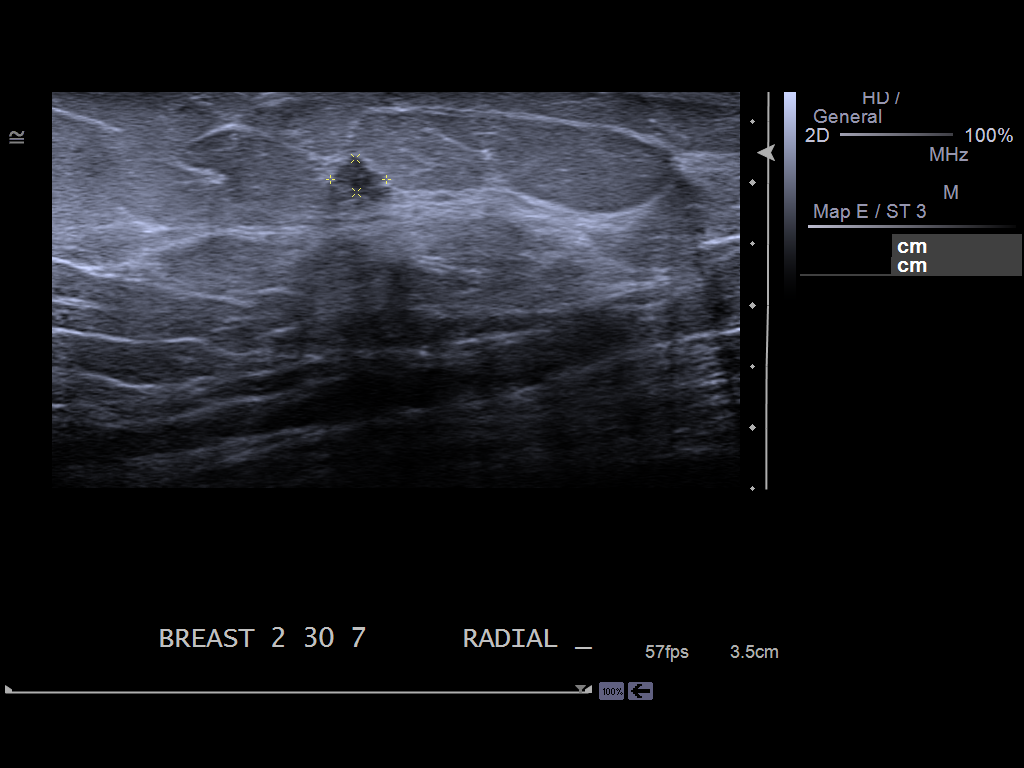
[im 3/4]
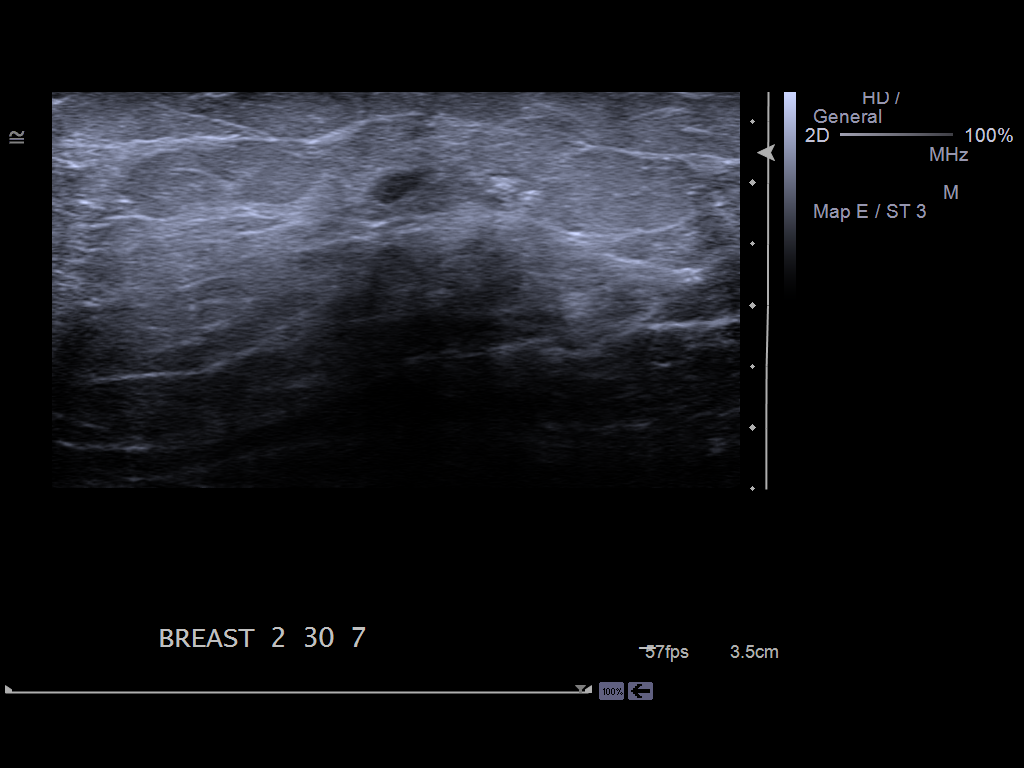
[im 4/4]
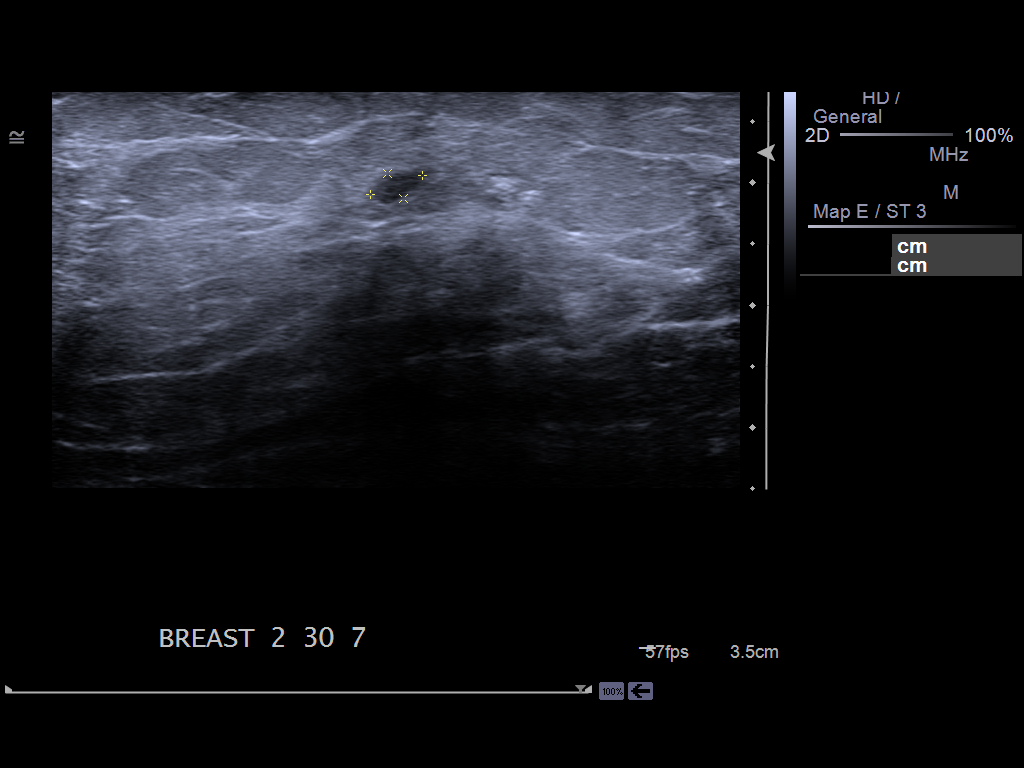

[4 of 4 positions shown; findings below may reference images not displayed]

ACR Breast Density Category b: There are scattered areas of
fibroglandular density.
FINDINGS: No suspicious mass, calcifications, or other abnormality is
identified on today's exam. A coil clip is noted within the upper,
outer left breast corresponding to the site of prior biopsy. The
previously seen left breast mass appears similar to slightly smaller
than on prior study.

Mammographic images were processed with CAD.

Targeted ultrasound of the left breast demonstrates a
similar-appearing oval, hypoechoic mass with lobulated margins at
230, 7 cm from the nipple measuring approximately 5 x 5 x 3 mm from
5 x 6 x 3 mm previously. Biopsy demonstrated fibroadenoma.
IMPRESSION: No significant interval change in the biopsied mass within the
upper, outer left breast. Biopsy demonstrated fibroadenoma.

RECOMMENDATION:
Screening mammogram at age 40 unless there are persistent or
intervening clinical concerns. (Code:VA-S-HBO)

I have discussed the findings and recommendations with the patient.
Results were also provided in writing at the conclusion of the
visit. If applicable, a reminder letter will be sent to the patient
regarding the next appointment.

BI-RADS CATEGORY  2: Benign.

## 2015-03-31 ENCOUNTER — Other Ambulatory Visit: Payer: Self-pay | Admitting: Family Medicine

## 2015-03-31 NOTE — Telephone Encounter (Signed)
Last office visit 11/11/2014 for Neck/Shoulder Pain.  Last refilled 11/12/2013 for #180 with 3 refills.  No future appointments.  Refill?

## 2015-05-06 LAB — OB RESULTS CONSOLE HIV ANTIBODY (ROUTINE TESTING): HIV: NONREACTIVE

## 2015-05-06 LAB — HM PAP SMEAR: HM PAP: NEGATIVE

## 2015-05-06 LAB — OB RESULTS CONSOLE VARICELLA ZOSTER ANTIBODY, IGG: VARICELLA IGG: IMMUNE

## 2015-05-06 LAB — OB RESULTS CONSOLE HEPATITIS B SURFACE ANTIGEN: Hepatitis B Surface Ag: NEGATIVE

## 2015-05-06 LAB — OB RESULTS CONSOLE RPR: RPR: NONREACTIVE

## 2015-05-06 LAB — OB RESULTS CONSOLE RUBELLA ANTIBODY, IGM: RUBELLA: NON-IMMUNE/NOT IMMUNE

## 2015-09-30 ENCOUNTER — Other Ambulatory Visit: Payer: Self-pay | Admitting: Family Medicine

## 2015-09-30 NOTE — Telephone Encounter (Signed)
Left message asking pt to call office  °

## 2015-09-30 NOTE — Telephone Encounter (Signed)
Please call and schedule CPE with Dr. Copland. 

## 2015-09-30 NOTE — Telephone Encounter (Signed)
Ok to refill 180, 0 refill  F/u CPX

## 2015-10-01 NOTE — Telephone Encounter (Signed)
Left message asking pt to call office  °

## 2015-10-05 ENCOUNTER — Encounter: Payer: Self-pay | Admitting: Family Medicine

## 2015-10-05 NOTE — Telephone Encounter (Signed)
Left message asking pt to call office Mailed letter 

## 2015-11-29 ENCOUNTER — Observation Stay
Admission: EM | Admit: 2015-11-29 | Discharge: 2015-11-29 | Disposition: A | Discharge status: Home / Self Care | Payer: 59 | Attending: Certified Nurse Midwife | Admitting: Certified Nurse Midwife

## 2015-11-29 ENCOUNTER — Encounter: Payer: Self-pay | Admitting: *Deleted

## 2015-11-29 DIAGNOSIS — Z3A35 35 weeks gestation of pregnancy: Secondary | ICD-10-CM | POA: Insufficient documentation

## 2015-11-29 DIAGNOSIS — O163 Unspecified maternal hypertension, third trimester: Secondary | ICD-10-CM | POA: Diagnosis present

## 2015-11-29 LAB — PROTEIN / CREATININE RATIO, URINE: Creatinine, Urine: 28 mg/dL

## 2015-11-29 LAB — CBC
HCT: 38.6 % (ref 35.0–47.0)
HEMOGLOBIN: 13.1 g/dL (ref 12.0–16.0)
MCH: 29.7 pg (ref 26.0–34.0)
MCHC: 34.1 g/dL (ref 32.0–36.0)
MCV: 87.2 fL (ref 80.0–100.0)
Platelets: 213 10*3/uL (ref 150–440)
RBC: 4.42 MIL/uL (ref 3.80–5.20)
RDW: 13.3 % (ref 11.5–14.5)
WBC: 8.3 10*3/uL (ref 3.6–11.0)

## 2015-11-29 LAB — COMPREHENSIVE METABOLIC PANEL
ALK PHOS: 95 U/L (ref 38–126)
ALT: 11 U/L — ABNORMAL LOW (ref 14–54)
AST: 16 U/L (ref 15–41)
Albumin: 2.8 g/dL — ABNORMAL LOW (ref 3.5–5.0)
Anion gap: 9 (ref 5–15)
BUN: 7 mg/dL (ref 6–20)
CO2: 21 mmol/L — AB (ref 22–32)
Calcium: 9 mg/dL (ref 8.9–10.3)
Chloride: 107 mmol/L (ref 101–111)
Creatinine, Ser: 0.6 mg/dL (ref 0.44–1.00)
GFR calc non Af Amer: >60 mL/min (ref >60)
Glucose, Bld: 85 mg/dL (ref 65–99)
Potassium: 3.7 mmol/L (ref 3.5–5.1)
SODIUM: 137 mmol/L (ref 135–145)
Total Bilirubin: 0.5 mg/dL (ref 0.3–1.2)
Total Protein: 6.3 g/dL — ABNORMAL LOW (ref 6.5–8.1)

## 2015-11-29 MED ORDER — BUTALBITAL-APAP-CAFFEINE 50-325-40 MG PO TABS
2.0000 | ORAL_TABLET | Freq: Once | ORAL | Status: AC
  Administered 2015-11-29: 2 via ORAL
  Filled 2015-11-29: qty 2

## 2015-11-29 NOTE — Final Progress Note (Signed)
Physician Final Progress Note  Patient ID: Lacey BorgStephanie Rodgers MRN: [id number] DOB/AGE: [birth year] 35 y.o.  Admit date: 11/29/2015 Admitting provider: Vena AustriaAndreas Staebler, MD/Cedra Villalon  Discharge date: 11/29/2015   Admission Diagnoses: IUP at 35w 4 days with elevated BPs, chronic HTN  Discharge Diagnoses:  Active Problems:   Elevated blood pressure affecting pregnancy in third trimester, antepartum  Same as above  Consults: None  Significant Findings/ Diagnostic Studies: [age] year old [id number], [facility] presenting from office at 34 4/[redacted] weeks gestation with elevated blood pressures.  She had elevated BP at home, 149/96, and has been having left temporal headache for 3 days, pain level 2/10, similar location as migraines.  Headaches have not resolved with tylenol.  Had an episode of nausea and vomiting this morning.  Not currently nauseated.  No RUQ pain.  Complains of some floaters.  Patient has chronic HTN and has only been taking aldomet 250 mg QHS, instead of 500 mg BID.  Baby has been active.  Cramping pain in left lower quadrant, intermittent, radiates around to back.  Denies vaginal bleeding, LOF, contractions.  She also reports working 60 hours per week at her job, and getting little breaks.  Prenatal care as [facility] is also remarkable for HSV, obesity with BMI 42, and anxiety/depression.  Current medications include: Methyldopa 250 mg PO QHS Prenatal vitamin QHS Omeprazole 20mg  PO QHS Magnesium 400 mg PO QHS Tylenol Extra strength 2 tabs as needed for headache  Patient Vitals for the past 24 hrs:  BP Temp Temp src Pulse Resp Height Weight  11/29/15 1711 128/62 - - 92 - - -  11/29/15 1655 (!) 147/81 - - 91 - - -  11/29/15 1640 130/78 - - 87 - - -  11/29/15 1625 (!) 141/85 - - 88 - - -  11/29/15 1610 134/83 - - 85 - - -  11/29/15 1555 137/82 - - 92 - - -  11/29/15 1540 (!) 141/85 - - 89 - - -  11/29/15 1525 139/88 - - 96 - - -  11/29/15 1515 - - - - - 5' 0.25" (1.53 m)  101.2 kg (223 lb)  11/29/15 1510 140/89 - - 94 - - -  11/29/15 1455 (!) 149/90 98 F (36.7 C) Oral 91 18 - -   Results for orders placed or performed during the hospital encounter of 11/29/15 (from the past 24 hour(s))  Protein / creatinine ratio, urine     Status: None   Collection Time: 11/29/15  3:35 PM  Result Value Ref Range   Creatinine, Urine 28 mg/dL   Total Protein, Urine <6 mg/dL   Protein Creatinine Ratio        0.00 - 0.15 mg/mg[Cre]  CBC     Status: None   Collection Time: 11/29/15  3:46 PM  Result Value Ref Range   WBC 8.3 3.6 - 11.0 K/uL   RBC 4.42 3.80 - 5.20 MIL/uL   Hemoglobin 13.1 12.0 - 16.0 g/dL   HCT 40.938.6 81.135.0 - 91.447.0 %   MCV 87.2 80.0 - 100.0 fL   MCH 29.7 26.0 - 34.0 pg   MCHC 34.1 32.0 - 36.0 g/dL   RDW 78.213.3 95.611.5 - 21.314.5 %   Platelets 213 150 - 440 K/uL  Comprehensive metabolic panel     Status: Abnormal   Collection Time: 11/29/15  3:46 PM  Result Value Ref Range   Sodium 137 135 - 145 mmol/L   Potassium 3.7 3.5 - 5.1 mmol/L   Chloride 107  101 - 111 mmol/L   CO2 21 (L) 22 - 32 mmol/L   Glucose, Bld 85 65 - 99 mg/dL   BUN 7 6 - 20 mg/dL   Creatinine, Ser 1.610.60 0.44 - 1.00 mg/dL   Calcium 9.0 8.9 - 09.610.3 mg/dL   Total Protein 6.3 (L) 6.5 - 8.1 g/dL   Albumin 2.8 (L) 3.5 - 5.0 g/dL   AST 16 15 - 41 U/L   ALT 11 (L) 14 - 54 U/L   Alkaline Phosphatase 95 38 - 126 U/L   Total Bilirubin 0.5 0.3 - 1.2 mg/dL   GFR calc non Af Amer >60 >60 mL/min   GFR calc Af Amer >60 >60 mL/min   Anion gap 9 5 - 15   Neuro: Alert, cooperative, no acute distress, patellar reflexes +2 bilaterally Cardiac: S1, S2, no murmurs Lungs: CTA bilaterally Abdomen: Soft, non-tender, gravid, negative CVA tenderness Extremities: Negative calf pain, negative clonus, negative edema FHR: Baseline 130 bpm, moderate variability, accelerations to 170, no decelerations Toco: Uterine irritability noted Ultrasound: Cephalic presentation, AFI= 1.9+3.0+3.8 = 8.7cm  A: IUP at 35 weeks 4 days  with chronic HTN,  Non-compliance with anti-hypertensive medication No evidence of pre-eclampsia FWB: Cat 1, normal AFI  P: Increase methyldopa to 250mg  PO BID Decrease hours at work to no more than 36 per week, and 6 hours per day [facility]  Procedures: Non-stress test and AFI  Discharge Condition: stable  Disposition: Final discharge disposition not confirmed  Diet: Regular diet  Discharge Activity: Activity as tolerated/ work note given to reduce hours at work     Medication List    STOP taking these medications   ALPRAZolam 0.5 MG tablet Commonly known as:  XANAX   cyclobenzaprine 10 MG tablet Commonly known as:  FLEXERIL   diclofenac 75 MG EC tablet Commonly known as:  VOLTAREN   predniSONE 20 MG tablet Commonly known as:  DELTASONE   sertraline 50 MG tablet Commonly known as:  ZOLOFT     TAKE these medications   acetaminophen 500 MG chewable tablet Commonly known as:  TYLENOL Chew 1,000 mg by mouth every 6 (six) hours as needed for pain.   magnesium oxide 400 MG tablet Commonly known as:  MAG-OX Take 400 mg by mouth daily.   methyldopa 250 MG tablet Commonly known as:  ALDOMET TAKE ONE TABLET BY MOUTH TWICE DAILY   multivitamin-prenatal 27-0.8 MG Tabs tablet Take 1 tablet by mouth daily at 12 noon.   omeprazole 20 MG capsule Commonly known as:  PRILOSEC Take 20 mg by mouth daily.        Total time spent taking care of this patient: 30 minutes  Signed: Farrel ConnersGUTIERREZ, Zaul Hubers 11/29/2015, 5:53 PM

## 2015-12-15 ENCOUNTER — Inpatient Hospital Stay
Admission: EM | Admit: 2015-12-15 | Discharge: 2015-12-19 | DRG: 765 | Disposition: A | Discharge status: Home / Self Care | Payer: 59 | Attending: Obstetrics and Gynecology | Admitting: Obstetrics and Gynecology

## 2015-12-15 DIAGNOSIS — O4100X1 Oligohydramnios, unspecified trimester, fetus 1: Secondary | ICD-10-CM

## 2015-12-15 DIAGNOSIS — D62 Acute posthemorrhagic anemia: Secondary | ICD-10-CM | POA: Diagnosis not present

## 2015-12-15 DIAGNOSIS — O9081 Anemia of the puerperium: Secondary | ICD-10-CM | POA: Diagnosis not present

## 2015-12-15 DIAGNOSIS — M419 Scoliosis, unspecified: Secondary | ICD-10-CM | POA: Diagnosis present

## 2015-12-15 DIAGNOSIS — K219 Gastro-esophageal reflux disease without esophagitis: Secondary | ICD-10-CM | POA: Diagnosis present

## 2015-12-15 DIAGNOSIS — Z3A37 37 weeks gestation of pregnancy: Secondary | ICD-10-CM

## 2015-12-15 DIAGNOSIS — O4100X0 Oligohydramnios, unspecified trimester, not applicable or unspecified: Secondary | ICD-10-CM | POA: Diagnosis present

## 2015-12-15 DIAGNOSIS — O9962 Diseases of the digestive system complicating childbirth: Secondary | ICD-10-CM | POA: Diagnosis present

## 2015-12-15 DIAGNOSIS — O4103X0 Oligohydramnios, third trimester, not applicable or unspecified: Secondary | ICD-10-CM | POA: Diagnosis present

## 2015-12-15 DIAGNOSIS — O1002 Pre-existing essential hypertension complicating childbirth: Secondary | ICD-10-CM | POA: Diagnosis present

## 2015-12-15 LAB — TYPE AND SCREEN
ABO/RH(D): O POS
Antibody Screen: NEGATIVE

## 2015-12-15 LAB — CBC
HCT: 38.3 % (ref 35.0–47.0)
Hemoglobin: 13.1 g/dL (ref 12.0–16.0)
MCH: 29.7 pg (ref 26.0–34.0)
MCHC: 34.2 g/dL (ref 32.0–36.0)
MCV: 86.9 fL (ref 80.0–100.0)
Platelets: 222 10*3/uL (ref 150–440)
RBC: 4.41 MIL/uL (ref 3.80–5.20)
RDW: 13.9 % (ref 11.5–14.5)
WBC: 8.8 10*3/uL (ref 3.6–11.0)

## 2015-12-15 MED ORDER — BUTORPHANOL TARTRATE 1 MG/ML IJ SOLN
1.0000 mg | INTRAMUSCULAR | Status: DC | PRN
Start: 2015-12-15 — End: 2015-12-16

## 2015-12-15 MED ORDER — LIDOCAINE HCL (PF) 1 % IJ SOLN: 30.0000 mL | INTRAMUSCULAR | Status: DC | PRN

## 2015-12-15 MED ORDER — METHYLDOPA 250 MG PO TABS
250.0000 mg | ORAL_TABLET | Freq: Two times a day (BID) | ORAL | Status: DC
  Administered 2015-12-16 (×2): 250 mg via ORAL
  Filled 2015-12-15 (×3): qty 1

## 2015-12-15 MED ORDER — DINOPROSTONE 10 MG VA INST
10.0000 mg | VAGINAL_INSERT | Freq: Once | VAGINAL | Status: DC
  Filled 2015-12-15: qty 1

## 2015-12-15 MED ORDER — OXYTOCIN 10 UNIT/ML IJ SOLN
INTRAMUSCULAR | Status: AC
  Filled 2015-12-15: qty 2

## 2015-12-15 MED ORDER — MISOPROSTOL 200 MCG PO TABS
ORAL_TABLET | ORAL | Status: AC
  Filled 2015-12-15: qty 4

## 2015-12-15 MED ORDER — OXYTOCIN BOLUS FROM INFUSION: 500.0000 mL | Freq: Once | INTRAVENOUS | Status: DC

## 2015-12-15 MED ORDER — LACTATED RINGERS IV SOLN
500.0000 mL | INTRAVENOUS | Status: DC | PRN
  Administered 2015-12-16 (×2): 500 mL via INTRAVENOUS

## 2015-12-15 MED ORDER — SOD CITRATE-CITRIC ACID 500-334 MG/5ML PO SOLN: 30.0000 mL | ORAL | Status: DC | PRN

## 2015-12-15 MED ORDER — LACTATED RINGERS IV SOLN
INTRAVENOUS | Status: DC
  Administered 2015-12-15 – 2015-12-16 (×5): via INTRAVENOUS

## 2015-12-15 MED ORDER — OXYTOCIN 40 UNITS IN LACTATED RINGERS INFUSION - SIMPLE MED
2.5000 [IU]/h | INTRAVENOUS | Status: DC
  Filled 2015-12-15: qty 1000

## 2015-12-15 MED ORDER — ONDANSETRON HCL 4 MG/2ML IJ SOLN: 4.0000 mg | Freq: Four times a day (QID) | INTRAMUSCULAR | Status: DC | PRN

## 2015-12-15 MED ORDER — TERBUTALINE SULFATE 1 MG/ML IJ SOLN: 0.2500 mg | Freq: Once | INTRAMUSCULAR | Status: DC | PRN

## 2015-12-15 MED ORDER — LIDOCAINE HCL (PF) 1 % IJ SOLN
INTRAMUSCULAR | Status: AC
  Filled 2015-12-15: qty 30

## 2015-12-15 MED ORDER — AMMONIA AROMATIC IN INHA
RESPIRATORY_TRACT | Status: AC
  Filled 2015-12-15: qty 10

## 2015-12-15 MED ORDER — SODIUM CHLORIDE 0.9 % IJ SOLN
INTRAMUSCULAR | Status: AC
  Filled 2015-12-15: qty 50

## 2015-12-15 NOTE — Progress Notes (Signed)
Subjective:  No concerns doing well, feeling contractions increasing in frequency and intesity  Objective:   Vitals: Blood pressure 138/88, pulse 91, temperature 98.2 F (36.8 C), temperature source Oral, resp. rate 18, height 5' 0.25" (1.53 m), weight 226 lb (102.5 kg), last menstrual period 03/25/2015. General: NAD Abdomen: Gravid, non-tender Cervical Exam: 1/50/-2 midposition, soft  FHT: 130, moderate, +accels. mp dece;s Toco: q122min  Results for orders placed or performed during the hospital encounter of 12/15/15 (from the past 24 hour(s))  CBC     Status: None   Collection Time: 12/15/15  4:35 PM  Result Value Ref Range   WBC 8.8 3.6 - 11.0 K/uL   RBC 4.41 3.80 - 5.20 MIL/uL   Hemoglobin 13.1 12.0 - 16.0 g/dL   HCT 13.038.3 86.535.0 - 78.447.0 %   MCV 86.9 80.0 - 100.0 fL   MCH 29.7 26.0 - 34.0 pg   MCHC 34.2 32.0 - 36.0 g/dL   RDW 69.613.9 29.511.5 - 28.414.5 %   Platelets 222 150 - 440 K/uL  Type and screen [facility]     Status: None   Collection Time: 12/15/15  4:35 PM  Result Value Ref Range   ABO/RH(D) O POS    Antibody Screen NEG    Sample Expiration 12/18/2015     Assessment:   35 y.o. G3P0020 377w6d IOL oligohydramnios, CHTN  Plan:   1) Labor - contracting to frequently for cervidil. Foley bulb placed  2) Fetus - cat I tracing  3) BP well controlled

## 2015-12-15 NOTE — H&P (Signed)
Obstetric H&P   Chief Complaint: Contractions  Prenatal Care Provider:  [facility]  History of Present Illness: 35 y.o. [name] 1282w6d [facility]2015[name] scheduled appointment for antenatal testing and was found to have an AFI 4.14cm.  BP have been well controlled on methyldopa.  Denies HA, vision changes, RUQ or epigastric pain. +FM, no LOF, no ctx, no VB.  Patient does have a history of genital HSV.    PNL O pos / ABSC neg / RNI / VZI / HBsAg neg / HIV neg / RPR NR / 1-hr 114 / GBS negative  Review of Systems: 10 point review of systems negative unless otherwise noted in HPI  Past Medical History: Past Medical History:  Diagnosis Date  . Depression   . Genital herpes   . GERD (gastroesophageal reflux disease)   . Hypertension   . Migraine     Past Surgical History: Past Surgical History:  Procedure Laterality Date  . DILATION AND CURETTAGE OF UTERUS  01/2013   Family History: No family history on file.  Social History: Social History   Social History  . Marital status: Married    Spouse name: N/A  . Number of children: N/A  . Years of education: N/A   Occupational History  . Not on file.   Social History Main Topics  . Smoking status: Never Smoker  . Smokeless tobacco: Never Used  . Alcohol use No     Comment: occ  . Drug use: No  . Sexual activity: Yes   Other Topics Concern  . Not on file   Social History Narrative  . No narrative on file    Medications: Prior to Admission medications   Medication Sig Start Date End Date Taking? Authorizing Provider  acetaminophen (TYLENOL) 500 MG chewable tablet Chew 1,000 mg by mouth every 6 (six) hours as needed for pain.    Historical Provider, MD  magnesium oxide (MAG-OX) 400 MG tablet Take 400 mg by mouth daily.    Historical Provider, MD  methyldopa (ALDOMET) 250 MG tablet TAKE ONE TABLET BY MOUTH TWICE DAILY 10/05/15   Hannah BeatSpencer Copland, MD  omeprazole (PRILOSEC) 20 MG capsule Take 20 mg  by mouth daily.    Historical Provider, MD  Prenatal Vit-Fe Fumarate-FA (MULTIVITAMIN-PRENATAL) 27-0.8 MG TABS tablet Take 1 tablet by mouth daily at 12 noon.    Historical Provider, MD    Allergies: Allergies  Allergen Reactions  . Pseudoeph-Doxylamine-Dm-Apap Hives    Physical Exam: Vitals: Last menstrual period 03/25/2015.  Urine Dip Protein: N/A  FHT: 145, moderate, +accels, no decels Toco: q901min  General: NAD HEENT: normocephalic, anicteric Pulmonary: no increased work of breathing Cardiovascular: RRR Abdomen: Gravid,  Non-tender Leopolds: vtx Genitourinary: FTP/long/high.  Sterile speculum exam performed no evidence of HSV lesions noted MSK: no CVA Extremities: no edema  Labs: No results found for this or any previous visit (from the past 24 hour(s)).  Assessment: 35 y.o. G3P0020 8582w6d by 12/30/2015, IOL oligohydramnios, CHTN  Plan: 1) Oligohydramnios - proceed with IOL, cervidil  2) Fetus - cat I tracing  3) PNL -O pos / ABSC neg / RNI / VZI / HBsAg neg / HIV neg / RPR NR / 1-hr 114 / GBS negative  4) TDAP - 10/28/2015 declined influenza  5) CHTN - monitor BP's, continue methyldopa  6) Disposition - pending delivery

## 2015-12-16 ENCOUNTER — Inpatient Hospital Stay: Payer: 59 | Admitting: Certified Registered Nurse Anesthetist

## 2015-12-16 ENCOUNTER — Encounter
Admission: EM | Disposition: A | Discharge status: Home / Self Care | Payer: Self-pay | Source: Home / Self Care | Attending: Obstetrics and Gynecology

## 2015-12-16 DIAGNOSIS — O4100X0 Oligohydramnios, unspecified trimester, not applicable or unspecified: Secondary | ICD-10-CM

## 2015-12-16 LAB — RPR: RPR Ser Ql: NONREACTIVE

## 2015-12-16 LAB — PLATELET COUNT: Platelets: 219 10*3/uL (ref 150–440)

## 2015-12-16 SURGERY — Surgical Case
Anesthesia: Epidural

## 2015-12-16 MED ORDER — BUPIVACAINE HCL (PF) 0.5 % IJ SOLN
5.0000 mL | Freq: Once | INTRAMUSCULAR | Status: DC
  Filled 2015-12-16: qty 30

## 2015-12-16 MED ORDER — DEXTROSE 5 % IV SOLN
500.0000 mg | INTRAVENOUS | Status: DC
  Filled 2015-12-16: qty 500

## 2015-12-16 MED ORDER — OXYCODONE-ACETAMINOPHEN 5-325 MG PO TABS: 1.0000 | ORAL_TABLET | ORAL | Status: DC | PRN

## 2015-12-16 MED ORDER — NALBUPHINE HCL 10 MG/ML IJ SOLN: 5.0000 mg | Freq: Once | INTRAMUSCULAR | Status: DC | PRN

## 2015-12-16 MED ORDER — SODIUM CHLORIDE 0.9% FLUSH: 3.0000 mL | INTRAVENOUS | Status: DC | PRN

## 2015-12-16 MED ORDER — DIBUCAINE 1 % RE OINT: 1.0000 "application " | TOPICAL_OINTMENT | RECTAL | Status: DC | PRN

## 2015-12-16 MED ORDER — PHENYLEPHRINE HCL 10 MG/ML IJ SOLN
INTRAMUSCULAR | Status: DC | PRN
  Administered 2015-12-16 (×10): 100 ug via INTRAVENOUS
  Administered 2015-12-16: 200 ug via INTRAVENOUS
  Administered 2015-12-16 (×7): 100 ug via INTRAVENOUS

## 2015-12-16 MED ORDER — WITCH HAZEL-GLYCERIN EX PADS: 1.0000 "application " | MEDICATED_PAD | CUTANEOUS | Status: DC | PRN

## 2015-12-16 MED ORDER — IBUPROFEN 600 MG PO TABS: 600.0000 mg | ORAL_TABLET | Freq: Four times a day (QID) | ORAL | Status: DC

## 2015-12-16 MED ORDER — FERROUS SULFATE 325 (65 FE) MG PO TABS
325.0000 mg | ORAL_TABLET | Freq: Two times a day (BID) | ORAL | Status: DC
  Administered 2015-12-17 – 2015-12-19 (×5): 325 mg via ORAL
  Filled 2015-12-16 (×5): qty 1

## 2015-12-16 MED ORDER — DIPHENHYDRAMINE HCL 25 MG PO CAPS: 25.0000 mg | ORAL_CAPSULE | ORAL | Status: DC | PRN

## 2015-12-16 MED ORDER — KETOROLAC TROMETHAMINE 30 MG/ML IJ SOLN
30.0000 mg | Freq: Four times a day (QID) | INTRAMUSCULAR | Status: AC | PRN
  Administered 2015-12-16 – 2015-12-17 (×2): 30 mg via INTRAVENOUS
  Filled 2015-12-16 (×2): qty 1

## 2015-12-16 MED ORDER — OXYTOCIN 40 UNITS IN LACTATED RINGERS INFUSION - SIMPLE MED
2.5000 [IU]/h | INTRAVENOUS | Status: AC
  Filled 2015-12-16: qty 1000

## 2015-12-16 MED ORDER — HYDROMORPHONE HCL 1 MG/ML IJ SOLN: 0.5000 mg | INTRAMUSCULAR | Status: DC | PRN

## 2015-12-16 MED ORDER — SOD CITRATE-CITRIC ACID 500-334 MG/5ML PO SOLN
30.0000 mL | ORAL | Status: AC
  Administered 2015-12-16: 30 mL via ORAL

## 2015-12-16 MED ORDER — OXYTOCIN 40 UNITS IN LACTATED RINGERS INFUSION - SIMPLE MED
INTRAVENOUS | Status: DC | PRN
  Administered 2015-12-16: 100 mL via INTRAVENOUS
  Administered 2015-12-16: 1000 mL via INTRAVENOUS

## 2015-12-16 MED ORDER — MEPERIDINE HCL 25 MG/ML IJ SOLN: 6.2500 mg | INTRAMUSCULAR | Status: DC | PRN

## 2015-12-16 MED ORDER — FENTANYL CITRATE (PF) 100 MCG/2ML IJ SOLN
INTRAMUSCULAR | Status: AC
  Administered 2015-12-16: 25 ug via INTRAVENOUS
  Filled 2015-12-16: qty 2

## 2015-12-16 MED ORDER — SENNOSIDES-DOCUSATE SODIUM 8.6-50 MG PO TABS
2.0000 | ORAL_TABLET | ORAL | Status: DC
  Administered 2015-12-18: 2 via ORAL
  Filled 2015-12-16: qty 2

## 2015-12-16 MED ORDER — NALBUPHINE HCL 10 MG/ML IJ SOLN: 5.0000 mg | INTRAMUSCULAR | Status: DC | PRN

## 2015-12-16 MED ORDER — ONDANSETRON HCL 4 MG/2ML IJ SOLN
INTRAMUSCULAR | Status: DC | PRN
  Administered 2015-12-16: 4 mg via INTRAVENOUS

## 2015-12-16 MED ORDER — FENTANYL 2.5 MCG/ML W/ROPIVACAINE 0.2% IN NS 100 ML EPIDURAL INFUSION (ARMC-ANES)
EPIDURAL | Status: DC | PRN
  Administered 2015-12-16: 10 mL/h via EPIDURAL

## 2015-12-16 MED ORDER — BUPIVACAINE HCL (PF) 0.5 % IJ SOLN: 5.0000 mL | Freq: Once | INTRAMUSCULAR | Status: DC

## 2015-12-16 MED ORDER — DEXTROSE 5 % IV SOLN
1.0000 ug/kg/h | INTRAVENOUS | Status: DC | PRN
  Filled 2015-12-16: qty 2

## 2015-12-16 MED ORDER — CHLOROPROCAINE HCL (PF) 3 % IJ SOLN
INTRAMUSCULAR | Status: DC | PRN
  Administered 2015-12-16 (×3): 5 mL via EPIDURAL

## 2015-12-16 MED ORDER — PRENATAL MULTIVITAMIN CH
1.0000 | ORAL_TABLET | Freq: Every day | ORAL | Status: DC
  Administered 2015-12-17 – 2015-12-19 (×3): 1 via ORAL
  Filled 2015-12-16 (×3): qty 1

## 2015-12-16 MED ORDER — OXYTOCIN 40 UNITS IN LACTATED RINGERS INFUSION - SIMPLE MED
1.0000 m[IU]/min | INTRAVENOUS | Status: DC
Start: 2015-12-16 — End: 2015-12-16
  Administered 2015-12-16: 2 m[IU]/min via INTRAVENOUS
  Filled 2015-12-16: qty 1000

## 2015-12-16 MED ORDER — DEXTROSE 5 % IV SOLN
3.0000 g | INTRAVENOUS | Status: DC
  Filled 2015-12-16: qty 3000

## 2015-12-16 MED ORDER — BUPIVACAINE 0.25 % ON-Q PUMP DUAL CATH 400 ML
400.0000 mL | INJECTION | Status: DC
  Filled 2015-12-16: qty 400

## 2015-12-16 MED ORDER — FENTANYL 2.5 MCG/ML W/ROPIVACAINE 0.2% IN NS 100 ML EPIDURAL INFUSION (ARMC-ANES)
EPIDURAL | Status: AC
  Filled 2015-12-16: qty 100

## 2015-12-16 MED ORDER — NALOXONE HCL 0.4 MG/ML IJ SOLN: 0.4000 mg | INTRAMUSCULAR | Status: DC | PRN

## 2015-12-16 MED ORDER — ONDANSETRON HCL 4 MG/2ML IJ SOLN: 4.0000 mg | Freq: Three times a day (TID) | INTRAMUSCULAR | Status: DC | PRN

## 2015-12-16 MED ORDER — SIMETHICONE 80 MG PO CHEW
80.0000 mg | CHEWABLE_TABLET | Freq: Three times a day (TID) | ORAL | Status: DC
  Administered 2015-12-17 – 2015-12-19 (×8): 80 mg via ORAL
  Filled 2015-12-16 (×8): qty 1

## 2015-12-16 MED ORDER — LACTATED RINGERS IV SOLN
INTRAVENOUS | Status: DC
Start: 2015-12-16 — End: 2015-12-19
  Administered 2015-12-17: 02:00:00 via INTRAVENOUS

## 2015-12-16 MED ORDER — MENTHOL 3 MG MT LOZG
1.0000 | LOZENGE | OROMUCOSAL | Status: DC | PRN
  Filled 2015-12-16: qty 9

## 2015-12-16 MED ORDER — MEASLES, MUMPS & RUBELLA VAC ~~LOC~~ INJ
0.5000 mL | INJECTION | Freq: Once | SUBCUTANEOUS | Status: AC
  Administered 2015-12-19: 0.5 mL via SUBCUTANEOUS
  Filled 2015-12-16 (×2): qty 0.5

## 2015-12-16 MED ORDER — BUPIVACAINE HCL 0.5 % IJ SOLN
INTRAMUSCULAR | Status: DC | PRN
  Administered 2015-12-16: 10 mL

## 2015-12-16 MED ORDER — LIDOCAINE HCL (PF) 1 % IJ SOLN
INTRAMUSCULAR | Status: DC | PRN
  Administered 2015-12-16 (×2): 3 mL

## 2015-12-16 MED ORDER — BUPIVACAINE ON-Q PAIN PUMP (FOR ORDER SET NO CHG)
INJECTION | Status: DC
  Filled 2015-12-16: qty 1

## 2015-12-16 MED ORDER — FENTANYL CITRATE (PF) 100 MCG/2ML IJ SOLN
25.0000 ug | INTRAMUSCULAR | Status: AC | PRN
  Administered 2015-12-16 (×6): 25 ug via INTRAVENOUS

## 2015-12-16 MED ORDER — LIDOCAINE-EPINEPHRINE (PF) 1.5 %-1:200000 IJ SOLN
INTRAMUSCULAR | Status: DC | PRN
  Administered 2015-12-16 (×2): 3 mL via EPIDURAL

## 2015-12-16 MED ORDER — DIPHENHYDRAMINE HCL 50 MG/ML IJ SOLN: 12.5000 mg | INTRAMUSCULAR | Status: DC | PRN

## 2015-12-16 MED ORDER — SOD CITRATE-CITRIC ACID 500-334 MG/5ML PO SOLN
ORAL | Status: AC
  Administered 2015-12-16: 30 mL via ORAL
  Filled 2015-12-16: qty 15

## 2015-12-16 MED ORDER — BUPIVACAINE HCL (PF) 0.25 % IJ SOLN
INTRAMUSCULAR | Status: DC | PRN
  Administered 2015-12-16 (×4): 4 mL via EPIDURAL

## 2015-12-16 MED ORDER — KETOROLAC TROMETHAMINE 30 MG/ML IJ SOLN: 30.0000 mg | Freq: Four times a day (QID) | INTRAMUSCULAR | Status: AC | PRN

## 2015-12-16 MED ORDER — COCONUT OIL OIL
1.0000 "application " | TOPICAL_OIL | Status: DC | PRN
  Administered 2015-12-17: 1 via TOPICAL
  Filled 2015-12-16: qty 120

## 2015-12-16 MED ORDER — FENTANYL CITRATE (PF) 100 MCG/2ML IJ SOLN
INTRAMUSCULAR | Status: DC | PRN
  Administered 2015-12-16 (×4): 50 ug via INTRAVENOUS

## 2015-12-16 MED ORDER — FENTANYL 2.5 MCG/ML W/ROPIVACAINE 0.2% IN NS 100 ML EPIDURAL INFUSION (ARMC-ANES): 10.0000 mL/h | EPIDURAL | Status: DC

## 2015-12-16 SURGICAL SUPPLY — 31 items
BENZOIN TINCTURE PRP APPL 2/3 (GAUZE/BANDAGES/DRESSINGS) ×3 IMPLANT
CANISTER SUCT 3000ML (MISCELLANEOUS) ×3 IMPLANT
CATH KIT ON-Q SILVERSOAK 5IN (CATHETERS) ×6 IMPLANT
CLOSURE WOUND 1/2 X4 (GAUZE/BANDAGES/DRESSINGS) ×1
DRSG OPSITE POSTOP 4X10 (GAUZE/BANDAGES/DRESSINGS) ×3 IMPLANT
DRSG TELFA 3X8 NADH (GAUZE/BANDAGES/DRESSINGS) ×3 IMPLANT
ELECT CAUTERY BLADE 6.4 (BLADE) ×3 IMPLANT
ELECT REM PT RETURN 9FT ADLT (ELECTROSURGICAL) ×3
ELECTRODE REM PT RTRN 9FT ADLT (ELECTROSURGICAL) ×1 IMPLANT
GAUZE SPONGE 4X4 12PLY STRL (GAUZE/BANDAGES/DRESSINGS) ×3 IMPLANT
GLOVE BIO SURGEON STRL SZ7 (GLOVE) ×9 IMPLANT
GLOVE INDICATOR 7.5 STRL GRN (GLOVE) ×9 IMPLANT
GOWN STRL REUS W/ TWL LRG LVL3 (GOWN DISPOSABLE) ×3 IMPLANT
GOWN STRL REUS W/TWL LRG LVL3 (GOWN DISPOSABLE) ×6
LIQUID BAND (GAUZE/BANDAGES/DRESSINGS) ×6 IMPLANT
NS IRRIG 1000ML POUR BTL (IV SOLUTION) ×3 IMPLANT
PACK C SECTION AR (MISCELLANEOUS) ×3 IMPLANT
PAD OB MATERNITY 4.3X12.25 (PERSONAL CARE ITEMS) ×3 IMPLANT
PAD PREP 24X41 OB/GYN DISP (PERSONAL CARE ITEMS) ×3 IMPLANT
SLEEVE SCD COMPRESS THIGH MED (MISCELLANEOUS) ×3 IMPLANT
SPONGE LAP 18X18 5 PK (GAUZE/BANDAGES/DRESSINGS) ×3 IMPLANT
STRIP CLOSURE SKIN 1/2X4 (GAUZE/BANDAGES/DRESSINGS) ×2 IMPLANT
SUT CHROMIC GUT BROWN 0 54 (SUTURE) ×1 IMPLANT
SUT CHROMIC GUT BROWN 0 54IN (SUTURE) ×3
SUT MNCRL 4-0 (SUTURE) ×2
SUT MNCRL 4-0 27XMFL (SUTURE) ×1
SUT PDS AB 1 TP1 96 (SUTURE) ×6 IMPLANT
SUT PLAIN 2 0 XLH (SUTURE) ×3 IMPLANT
SUT VIC AB 0 CT1 36 (SUTURE) ×6 IMPLANT
SUTURE MNCRL 4-0 27XMF (SUTURE) ×1 IMPLANT
SWABSTK COMLB BENZOIN TINCTURE (MISCELLANEOUS) ×3 IMPLANT

## 2015-12-16 NOTE — Progress Notes (Signed)
L&D Note  12/16/2015 - 8:58 AM  35 y.o. G3P0020 8476w0d   [name] is admitted for IOL for Oligohydramnios   Subjective:  Feeling constant discomfort and irregular ctx Objective:   Vitals:   12/16/15 0103 12/16/15 0414 12/16/15 0550 12/16/15 0714  BP: 124/89 119/72  (!) 150/83  Pulse: 93 92  89  Resp:    16  Temp:  98.1 F (36.7 C) 98.2 F (36.8 C) 97.5 F (36.4 C)  TempSrc:  Oral Oral Oral  Weight:      Height:        Current Vital Signs 24h Vital Sign Ranges  T 97.5 F (36.4 C) Temp  Avg: 98.1 F (36.7 C)  Min: 97.5 F (36.4 C)  Max: 98.3 F (36.8 C)  BP (!) 150/83 BP  Min: 119/72  Max: 169/128  HR 89 Pulse  Avg: 92.2  Min: 88  Max: 97  RR 16 Resp  Avg: 17  Min: 16  Max: 18  SaO2     No Data Recorded       24 Hour I/O Current Shift I/O  Time Ins Outs 12/13 0701 - 12/14 0700 In: 291.7 [I.V.:291.7] Out: -  No intake/output data recorded.    FHR: cat 1, baseline 135, + accels, no decels Toco: q 2-5 min, difficult to assess  SVE: 5/75/-2   Assessment :  IUP at 2276w0d, CHTN, Oligohydramnios    Plan:  Begin Pitocin Nitrous, IV stadol or epidural when desired  Marta AntuBrothers, Victor Langenbach, CNM

## 2015-12-16 NOTE — Anesthesia Procedure Notes (Signed)
Epidural Patient location during procedure: OB Start time: 12/16/2015 3:05 PM End time: 12/16/2015 3:35 PM  Staffing Anesthesiologist: Yevette EdwardsADAMS, JAMES G Resident/CRNA: Malva CoganBEANE, Blenda Wisecup Performed: resident/CRNA   Preanesthetic Checklist Completed: patient identified, site marked, surgical consent, pre-op evaluation, timeout performed, IV checked, risks and benefits discussed and monitors and equipment checked  Epidural Patient position: sitting Prep: Betadine Patient monitoring: heart rate, continuous pulse ox and blood pressure Approach: midline Location: L4-L5 Injection technique: LOR saline  Needle:  Needle type: Tuohy  Needle gauge: 17 G Needle length: 9 cm and 9 Needle insertion depth: 9 cm Catheter type: closed end flexible Catheter size: 19 Gauge Catheter at skin depth: 15 cm Test dose: negative and 1.5% lidocaine with Epi 1:200 K  Assessment Events: blood not aspirated, injection not painful, no injection resistance, negative IV test and no paresthesia  Additional Notes Pt. Evaluated and documentation done after procedure finished. Patient identified. Risks/Benefits/Options discussed with patient including but not limited to bleeding, infection, nerve damage, paralysis, failed block, incomplete pain control, headache, blood pressure changes, nausea, vomiting, reactions to medication both or allergic, itching and postpartum back pain. Confirmed with bedside nurse the patient's most recent platelet count. Confirmed with patient that they are not currently taking any anticoagulation, have any bleeding history or any family history of bleeding disorders. Patient expressed understanding and wished to proceed. All questions were answered. Sterile technique was used throughout the entire procedure. Please see nursing notes for vital signs. Test dose was given through epidural catheter and negative prior to continuing to dose epidural or start infusion. Warning signs of high block  given to the patient including shortness of breath, tingling/numbness in hands, complete motor block, or any concerning symptoms with instructions to call for help. Patient was given instructions on fall risk and not to get out of bed. All questions and concerns addressed with instructions to call with any issues or inadequate analgesia.   Patient tolerated the insertion well without immediate complications.Reason for block:procedure for pain

## 2015-12-16 NOTE — Anesthesia Procedure Notes (Addendum)
Epidural Patient location during procedure: OB Start time: 12/16/2015 1:05 PM End time: 12/16/2015 1:55 PM  Staffing Anesthesiologist: Lenard SimmerKARENZ, ANDREW Resident/CRNA: Malva CoganBEANE, Delon Revelo Performed: resident/CRNA   Preanesthetic Checklist Completed: patient identified, site marked, surgical consent, pre-op evaluation, timeout performed, IV checked, risks and benefits discussed and monitors and equipment checked  Epidural Patient position: sitting Prep: Betadine Patient monitoring: heart rate, continuous pulse ox and blood pressure Approach: midline Location: L3-L4 Injection technique: LOR saline  Needle:  Needle type: Tuohy  Needle gauge: 17 G Needle length: 9 cm and 9 Needle insertion depth: 7 cm Catheter type: closed end flexible Catheter size: 19 Gauge Catheter at skin depth: 12 cm Test dose: negative and 1.5% lidocaine with Epi 1:200 K  Assessment Events: blood not aspirated, injection not painful, no injection resistance, negative IV test and no paresthesia  Additional Notes Pt. Evaluated and documentation done after procedure finished. Patient identified. Risks/Benefits/Options discussed with patient including but not limited to bleeding, infection, nerve damage, paralysis, failed block, incomplete pain control, headache, blood pressure changes, nausea, vomiting, reactions to medication both or allergic, itching and postpartum back pain. Confirmed with bedside nurse the patient's most recent platelet count. Confirmed with patient that they are not currently taking any anticoagulation, have any bleeding history or any family history of bleeding disorders. Patient expressed understanding and wished to proceed. All questions were answered. Sterile technique was used throughout the entire procedure. Please see nursing notes for vital signs. Test dose was given through epidural catheter and negative prior to continuing to dose epidural or start infusion. Warning signs of high block  given to the patient including shortness of breath, tingling/numbness in hands, complete motor block, or any concerning symptoms with instructions to call for help. Patient was given instructions on fall risk and not to get out of bed. All questions and concerns addressed with instructions to call with any issues or inadequate analgesia.   Patient tolerated the insertion well without immediate complications.Reason for block:procedure for pain

## 2015-12-16 NOTE — Discharge Summary (Signed)
OB Discharge Summary     Patient Name: Lacey BorgStephanie Deasis DOB: 07/05/1980 MRN: [id number]  Date of admission: 12/15/2015 Delivering MD: Conard NovakJackson, Janyra Barillas D, MD  Date of Delivery: 12/16/2015  Date of discharge: 12/19/2015  Admitting diagnosis: 38 wks preg low fluids  Intrauterine pregnancy: 1942w0d      Secondary diagnosis: Chronic Hypertension     Discharge diagnosis: Term Pregnancy Delivered and CHTN                                                                                                Post partum procedures:n/a  Augmentation: AROM and Pitocin  Complications: None  Hospital course:  Onset of Labor With Unplanned C/S  35 y.o. yo G3P0020 at 5142w0d was admitted in active labor on 12/15/2015. Patient had a labor course significant for pitocin administration and AROM. Membrane Rupture Time/Date: 5:33 AM ,12/16/2015   The patient went for cesarean section due to Arrest of Dilation with adequate MVUs for >3 hours, and delivered a Viable infant,12/16/2015  Details of operation can be found in separate operative note. Patient had an uncomplicated postpartum course.  She is ambulating,tolerating a regular diet, passing flatus, and urinating well.  Patient is discharged home in stable condition 12/19/15.   Physical exam  Vitals:   12/19/15 0058 12/19/15 0459 12/19/15 0838 12/19/15 1152  BP: (!) 148/82 134/77 130/76 (!) 145/89  Pulse:  98 95 (!) 106  Resp:  20 18 20   Temp:  97.9 F (36.6 C) 97.8 F (36.6 C) 97.5 F (36.4 C)  TempSrc:  Oral Oral Oral  SpO2:   100%   Weight:      Height:       General: alert, cooperative and no distress Lochia: appropriate Uterine Fundus: firm Incision: Healing well with no significant drainage, Dressing is clean, dry, and intact DVT Evaluation: No evidence of DVT seen on physical exam. No cords or calf tenderness. No significant calf/ankle edema.  Labs: Lab Results  Component Value Date   WBC 9.0 12/17/2015   HGB 10.1 (L) 12/17/2015   HCT 29.7 (L) 12/17/2015   MCV 85.6 12/17/2015   PLT 180 12/17/2015   CMP Latest Ref Rng & Units 11/29/2015  Glucose 65 - 99 mg/dL 85  BUN 6 - 20 mg/dL 7  Creatinine 4.090.44 - 8.111.00 mg/dL 9.140.60  Sodium 782135 - 956145 mmol/L 137  Potassium 3.5 - 5.1 mmol/L 3.7  Chloride 101 - 111 mmol/L 107  CO2 22 - 32 mmol/L 21(L)  Calcium 8.9 - 10.3 mg/dL 9.0  Total Protein 6.5 - 8.1 g/dL 6.3(L)  Total Bilirubin 0.3 - 1.2 mg/dL 0.5  Alkaline Phos 38 - 126 U/L 95  AST 15 - 41 U/L 16  ALT 14 - 54 U/L 11(L)    Discharge instruction: per After Visit Summary.  Medications:  Allergies as of 12/19/2015      Reactions   Pseudoeph-doxylamine-dm-apap Hives      Medication List    TAKE these medications   acetaminophen 500 MG chewable tablet Commonly known as:  TYLENOL Chew 1,000 mg by mouth every 6 (six) hours as needed for pain.  ibuprofen 600 MG tablet Commonly known as:  ADVIL,MOTRIN Take 1 tablet (600 mg total) by mouth every 6 (six) hours.   magnesium oxide 400 MG tablet Commonly known as:  MAG-OX Take 400 mg by mouth daily.   methyldopa 250 MG tablet Commonly known as:  ALDOMET TAKE ONE TABLET BY MOUTH TWICE DAILY   multivitamin-prenatal 27-0.8 MG Tabs tablet Take 1 tablet by mouth daily at 12 noon.   omeprazole 20 MG capsule Commonly known as:  PRILOSEC Take 20 mg by mouth daily.   oxyCODONE-acetaminophen 5-325 MG tablet Commonly known as:  PERCOCET/ROXICET Take 2 tablets by mouth every 4 (four) hours as needed for severe pain (pain score >7/10).       Diet: routine diet  Activity: Advance as tolerated. Pelvic rest for 6 weeks.   Outpatient follow up: Follow-up Information    Conard NovakJackson, Danarius Mcconathy D, MD Follow up in 1 week(s).   Specialty:  Obstetrics and Gynecology Why:  post op incision check Contact information: [address] [place] [address] [phone]             Postpartum contraception: Undecided Rhogam Given postpartum: no Rubella vaccine given  postpartum: yes Varicella vaccine given postpartum: no TDaP given antepartum or postpartum: AP  Newborn Data: Live born female  Birth Weight: 7 lb 7.6 oz (3390 g) APGAR: 8, 9   Baby Feeding: Breast  Disposition:home with mother  SIGNED: Thomasene MohairStephen Sabriya Yono, MD 12/19/2015 12:19 PM

## 2015-12-16 NOTE — Anesthesia Preprocedure Evaluation (Addendum)
Anesthesia Evaluation  Patient identified by MRN, date of birth, ID band Patient awake    Reviewed: Allergy & Precautions, NPO status , Patient's Chart, lab work & pertinent test results  Airway Mallampati: II  TM Distance: >3 FB Neck ROM: Full    Dental   Pulmonary           Cardiovascular hypertension, Pt. on medications      Neuro/Psych  Headaches, PSYCHIATRIC DISORDERS Depression    GI/Hepatic GERD  ,  Endo/Other    Renal/GU      Musculoskeletal   Abdominal   Peds  Hematology   Anesthesia Other Findings Scoliosis  Reproductive/Obstetrics                            Anesthesia Physical Anesthesia Plan  ASA: II  Anesthesia Plan: Epidural   Post-op Pain Management:    Induction:   Airway Management Planned:   Additional Equipment:   Intra-op Plan:   Post-operative Plan:   Informed Consent:   Dental advisory given  Plan Discussed with: CRNA and Anesthesiologist  Anesthesia Plan Comments:         Anesthesia Quick Evaluation

## 2015-12-16 NOTE — Transfer of Care (Signed)
Immediate Anesthesia Transfer of Care Note  Patient: Lacey Rodgers  Procedure(s) Performed: Procedure(s): CESAREAN SECTION (N/A)  Patient Location: PACU  Anesthesia Type:MAC  Level of Consciousness: awake, alert  and oriented  Airway & Oxygen Therapy: Patient Spontanous Breathing  Post-op Assessment: Report given to RN and Post -op Vital signs reviewed and stable  Post vital signs: Reviewed and stable  Last Vitals:  Vitals:   12/16/15 1918 12/16/15 1922  BP: (!) 152/96 (!) 152/91  Pulse: 100 (!) 101  Resp:  17  Temp:  36.8 C    Last Pain:  Vitals:   12/16/15 1922  TempSrc: Oral  PainSc: 5          Complications: No apparent anesthesia complications

## 2015-12-16 NOTE — Progress Notes (Signed)
S: Feeling some relief from epidural, but still c/o contraction pain  O: Cervix 4.5/70/-2, VSS, essentially no epidural level  A: IUP at 38 wks, IOL for Oligo, CHTN  P: AROM of forebag and IUPC placed - titrate Pitocin for adequate MVUs Anticipate vaginal delivery

## 2015-12-16 NOTE — Progress Notes (Signed)
Patient ID: Lacey BorgStephanie Rodgers, female   DOB: 04-Oct-1980, 35 y.o.   MRN: [id number]  Labor Check  Subj:  Complaints: comfortable with epidural   Obj:  BP (!) 145/87 (BP Location: Left Arm)   Pulse 93   Temp 97.6 F (36.4 C) (Oral)   Resp 18   Ht 5' 0.25" (1.53 m)   Wt 226 lb (102.5 kg)   LMP 03/25/2015   SpO2 97%   BMI 43.77 kg/m  Dose (milli-units/min) Oxytocin: 4 milli-units/min  Cervix: Dilation: 5 / Effacement (%): 70, 80 / Station: -2  Baseline FHR: 130    Variability: moderate    Accelerations: present    Decelerations: present (early decelerations) Contractions: present frequency: 5 q 10 minutes.  MVUs about 250+ over past 3-4 hours  A/P: 35 y.o. G3P0020 female at 7898w0d with failure to progress.  1.  Labor: Failure to progress with more than adequate MVUs for > 3 hours.  Will move to cesarean delivery due to this.    2.  FWB: reassuring, Overall assessment: category 1  3.  GBS negative  4.  Pain: epidural  5.  Recheck: prn  Plan to move to c-section.  Discussed plan with patient who is in agreement.  Reviewed c-section consent with risks and benefits of c-section versus continuing with attempting labor induction.  Patient agrees to move forward.  There is a case going on in the general surgery ORs currently and a VBAC is in house.  The anesthesia team has requested we delay the case in favor of having an available team for the VBAC in case of emergency.  The patient's membranes have only been ruptured for a few hours and the fetal status is reassuring.  Given this, I believe it is reasonable and safer to delay the surgery for one hour, unless the fetal status changes.     Thomasene MohairStephen Abbeygail Igoe, MD 12/16/2015 6:34 PM

## 2015-12-16 NOTE — Op Note (Signed)
Cesarean Section Operative Note    Biagio BorgStephanie Ventresca   12/16/2015   Pre-operative Diagnosis:  1) intrauterine pregnancy at 7155w0d  2) failure to progress   Post-operative Diagnosis:  1) intrauterine pregnancy at 4955w0d  2) failure to progress   Procedure: Primary Low Transverse Cesarean Section via Pfannenstiel incision  Surgeon: Surgeon(s) and Role:    * Conard NovakStephen D Trigg Delarocha, MD - Primary   Anesthesia: epidural   Findings:  1) normal appearing gravid uterus, fallopian tubes, and ovaries 2) viable female infant with weight 3,390 grams and APGARs 8 and 9   Estimated Blood Loss: 900 mL  Total IV Fluids: 1,800 ml   Urine Output: 1,100 mL clear urine at end of procedure  Specimens: none  Complications: no complications  Disposition: PACU - hemodynamically stable.   Maternal Condition: stable   Baby condition / location:  Couplet care / Skin to Skin  Procedure Details:  The patient was seen in the Holding Room. The risks, benefits, complications, treatment options, and expected outcomes were discussed with the patient. The patient concurred with the proposed plan, giving informed consent. identified as Biagio BorgStephanie Langhans and the procedure verified as C-Section Delivery. A Time Out was held and the above information confirmed.   After induction of anesthesia, the patient was draped and prepped in the usual sterile manner. A Pfannenstiel incision was made and carried down through the subcutaneous tissue to the fascia. Fascial incision was made and extended transversely. The fascia was separated from the underlying rectus tissue superiorly and inferiorly. The peritoneum was identified and entered. Peritoneal incision was extended longitudinally. The bladder flap was sharply freed from the lower uterine segment. A low transverse uterine incision was made and the hysterotomy was extended with cranial-caudal tension. Delivered from cephalic presentation was a 3,390 gram Living newborn infant(s)  or Female with Apgar scores of 8 at one minute and 9 at five minutes. Cord ph was not sent the umbilical cord was clamped and cut cord blood was obtained for evaluation. The placenta was removed Intact and appeared normal. The uterine outline, tubes and ovaries appeared normal. The uterine incision was closed with running locked sutures of 0 Vicryl.  A second layer of the same suture was thrown in an imbricating fashion.  Hemostasis was assured.  The uterus was returned to the abdomen and the paracolic gutters were cleared of all clots and debris.  The peritoneum was reapproximated using 0-Vicryl in a running fashion.  The rectus muscles were inspected and found to be hemostatic.  The On-Q catheter pumps were inserted in accordance with the manufacturer's recommendations.  The catheters were inserted approximately 4cm cephelad to the incision line, approximately 1cm apart, straddling the midline.  They were inserted to a depth of the 4th mark. They were positioned superficial to the rectus abdominus muscles and deep to the rectus fascia.    The fascia was then reapproximated with running sutures of 1-0 PDS, looped. The subcutaneous tissue was reapproximated using 2-0 plain gut such that no greater than 2cm of dead space remained. The subcuticular closure was performed using 4-0 monocryl. The skin closure was reinforced using benzoin and 1/2" steri-strips.  The On-Q catheters were bolused with 5 mL of 0.5% marcaine plain for a total of 10 mL.  The catheters were affixed to the skin with surgical skin glue, steri-strips, and tegaderm.    Instrument, sponge, and needle counts were correct prior the abdominal closure and were correct at the conclusion of the case.  The patient  received Ancef 3 gram IV and Azithromycin 500 mg IV prior to skin incision (within 30 minutes). For VTE prophylaxis she was wearing SCDs throughout the case.   Signed: Conard NovakStephen D. Karletta Millay, MD 12/16/2015 9:07 PM

## 2015-12-17 ENCOUNTER — Encounter: Payer: Self-pay | Admitting: Obstetrics and Gynecology

## 2015-12-17 LAB — CBC
HEMATOCRIT: 29.7 % — AB (ref 35.0–47.0)
Hemoglobin: 10.1 g/dL — ABNORMAL LOW (ref 12.0–16.0)
MCH: 29 pg (ref 26.0–34.0)
MCHC: 33.9 g/dL (ref 32.0–36.0)
MCV: 85.6 fL (ref 80.0–100.0)
Platelets: 180 10*3/uL (ref 150–440)
RBC: 3.47 MIL/uL — AB (ref 3.80–5.20)
RDW: 14.1 % (ref 11.5–14.5)
WBC: 9 10*3/uL (ref 3.6–11.0)

## 2015-12-17 MED ORDER — OXYCODONE-ACETAMINOPHEN 5-325 MG PO TABS
2.0000 | ORAL_TABLET | ORAL | Status: DC | PRN
  Administered 2015-12-17 – 2015-12-19 (×7): 2 via ORAL
  Filled 2015-12-17 (×8): qty 2

## 2015-12-17 MED ORDER — OXYCODONE-ACETAMINOPHEN 5-325 MG PO TABS
1.0000 | ORAL_TABLET | ORAL | Status: DC | PRN
  Administered 2015-12-18: 1 via ORAL

## 2015-12-17 MED ORDER — SIMETHICONE 80 MG PO CHEW: 80.0000 mg | CHEWABLE_TABLET | Freq: Four times a day (QID) | ORAL | Status: DC | PRN

## 2015-12-17 MED ORDER — IBUPROFEN 600 MG PO TABS
600.0000 mg | ORAL_TABLET | Freq: Four times a day (QID) | ORAL | Status: DC
  Administered 2015-12-17 – 2015-12-19 (×9): 600 mg via ORAL
  Filled 2015-12-17 (×9): qty 1

## 2015-12-17 MED ORDER — OXYCODONE-ACETAMINOPHEN 5-325 MG PO TABS: 1.0000 | ORAL_TABLET | ORAL | Status: DC | PRN

## 2015-12-17 MED ORDER — OXYCODONE-ACETAMINOPHEN 5-325 MG PO TABS
1.0000 | ORAL_TABLET | ORAL | Status: DC | PRN
  Filled 2015-12-17: qty 1

## 2015-12-17 NOTE — Anesthesia Postprocedure Evaluation (Signed)
Anesthesia Post Note  Patient: Lacey BorgStephanie Landino  Procedure(s) Performed: Procedure(s) (LRB): CESAREAN SECTION (N/A)  Patient location during evaluation: Mother Baby Anesthesia Type: Epidural Level of consciousness: awake and alert Pain management: satisfactory to patient Vital Signs Assessment: post-procedure vital signs reviewed and stable Respiratory status: spontaneous breathing and nonlabored ventilation Cardiovascular status: stable Postop Assessment: no headache, no backache, patient able to bend at knees and epidural receding Anesthetic complications: no    Last Vitals:  Vitals:   12/17/15 0326 12/17/15 0717  BP: 114/61 121/62  Pulse: (!) 102 (!) 105  Resp: 20 18  Temp: 36.4 C 37.4 C    Last Pain:  Vitals:   12/17/15 0717  TempSrc: Oral  PainSc:                  [name],  [name]

## 2015-12-17 NOTE — Progress Notes (Signed)
  Subjective:  Doing well no concerns.  Pain well controlled on oral anaglesics.  Lochia appropriate.  No N/V fevers or chills   Objective:  Blood pressure 121/62, pulse (!) 105, temperature 99.3 F (37.4 C), temperature source Oral, resp. rate 18, height 5' 0.25" (1.53 m), weight 226 lb (102.5 kg), last menstrual period 03/25/2015, SpO2 96 %.  General: NAD Pulmonary: no increased work of breathing Abdomen: non-distended, non-tender, fundus firm at level of umbilicus Incision: D/C/I pressure dressing Extremities: no edema, no erythema, no tenderness  Results for orders placed or performed during the hospital encounter of 12/15/15 (from the past 72 hour(s))  CBC     Status: None   Collection Time: 12/15/15  4:35 PM  Result Value Ref Range   WBC 8.8 3.6 - 11.0 K/uL   RBC 4.41 3.80 - 5.20 MIL/uL   Hemoglobin 13.1 12.0 - 16.0 g/dL   HCT 96.038.3 45.435.0 - 09.847.0 %   MCV 86.9 80.0 - 100.0 fL   MCH 29.7 26.0 - 34.0 pg   MCHC 34.2 32.0 - 36.0 g/dL   RDW 11.913.9 14.711.5 - 82.914.5 %   Platelets 222 150 - 440 K/uL  Type and screen [facility]     Status: None   Collection Time: 12/15/15  4:35 PM  Result Value Ref Range   ABO/RH(D) O POS    Antibody Screen NEG    Sample Expiration 12/18/2015   RPR     Status: None   Collection Time: 12/15/15  4:35 PM  Result Value Ref Range   RPR Ser Ql Non Reactive Non Reactive    Comment: (NOTE) Performed At: [facility] [address] [place], [place] [id number] Mila HomerHancock William F MD HQ:[id number]:[phone]   Platelet count     Status: None   Collection Time: 12/16/15 10:26 AM  Result Value Ref Range   Platelets 219 150 - 440 K/uL  CBC     Status: Abnormal   Collection Time: 12/17/15  5:47 AM  Result Value Ref Range   WBC 9.0 3.6 - 11.0 K/uL   RBC 3.47 (L) 3.80 - 5.20 MIL/uL   Hemoglobin 10.1 (L) 12.0 - 16.0 g/dL   HCT 13.229.7 (L) 44.035.0 - 10.247.0 %   MCV 85.6 80.0 - 100.0 fL   MCH 29.0 26.0 - 34.0 pg   MCHC 33.9 32.0 - 36.0 g/dL   RDW 72.514.1  36.611.5 - 44.014.5 %   Platelets 180 150 - 440 K/uL     Assessment:   35 y.o. [id number] postoperativeday # 1 1LTCS for failure to progress IOL for oligohydramnios, CHTN   Plan:  1) Acute blood loss anemia - hemodynamically stable and asymptomatic - po ferrous sulfate  2) --/--/O POS (12/13 1635) / Nonimmune (05/04 0000) / Varicella Immune  3) TDAP status UTD  4) Breast feeding  5) CHTN - normotensive, on methyldopa  6) Disposition anticipate discharge POD3

## 2015-12-18 NOTE — Progress Notes (Signed)
  Subjective:  Doing well no concerns.  Pain well controlled on oral anaglesics.  Lochia appropriate.  No N/V fevers or chills   Objective:  BP 128/71 (BP Location: Left Arm)   Pulse 96   Temp 97.5 F (36.4 C) (Oral)   Resp 16   Ht 5' 0.25" (1.53 m)   Wt 226 lb (102.5 kg)   LMP 03/25/2015   SpO2 99%   BMI 43.77 kg/m    General: NAD Pulmonary: no increased work of breathing Abdomen: non-distended, non-tender, fundus firm at level of umbilicus Incision: D/C/I pressure dressing Extremities: no edema, no erythema, no tenderness  Results for orders placed or performed during the hospital encounter of 12/15/15 (from the past 72 hour(s))  CBC     Status: None   Collection Time: 12/15/15  4:35 PM  Result Value Ref Range   WBC 8.8 3.6 - 11.0 K/uL   RBC 4.41 3.80 - 5.20 MIL/uL   Hemoglobin 13.1 12.0 - 16.0 g/dL   HCT 09.838.3 11.935.0 - 14.747.0 %   MCV 86.9 80.0 - 100.0 fL   MCH 29.7 26.0 - 34.0 pg   MCHC 34.2 32.0 - 36.0 g/dL   RDW 82.913.9 56.211.5 - 13.014.5 %   Platelets 222 150 - 440 K/uL  Type and screen [facility]     Status: None   Collection Time: 12/15/15  4:35 PM  Result Value Ref Range   ABO/RH(D) O POS    Antibody Screen NEG    Sample Expiration 12/18/2015   RPR     Status: None   Collection Time: 12/15/15  4:35 PM  Result Value Ref Range   RPR Ser Ql Non Reactive Non Reactive    Comment: (NOTE) Performed At: [facility] [address] [place], [place] [id number] Mila HomerHancock William F MD EX:[id number]:[phone]   Platelet count     Status: None   Collection Time: 12/16/15 10:26 AM  Result Value Ref Range   Platelets 219 150 - 440 K/uL  CBC     Status: Abnormal   Collection Time: 12/17/15  5:47 AM  Result Value Ref Range   WBC 9.0 3.6 - 11.0 K/uL   RBC 3.47 (L) 3.80 - 5.20 MIL/uL   Hemoglobin 10.1 (L) 12.0 - 16.0 g/dL   HCT 10.229.7 (L) 72.535.0 - 36.647.0 %   MCV 85.6 80.0 - 100.0 fL   MCH 29.0 26.0 - 34.0 pg   MCHC 33.9 32.0 - 36.0 g/dL   RDW 44.014.1 34.711.5 - 42.514.5 %   Platelets 180 150 - 440 K/uL     Assessment:   35 y.o. [id number] postoperativeday # 2 1LTCS for failure to progress IOL for oligohydramnios, CHTN   Plan:  1) Acute blood loss anemia - hemodynamically stable and asymptomatic - po ferrous sulfate  2) --/--/O POS (12/13 1635) / Nonimmune (05/04 0000) / Varicella Immune  3) TDAP status UTD, declined flu vaccine  4) Breast feeding  5) CHTN - normotensive, on methyldopa  6) Disposition anticipate discharge POD3  Thomasene MohairStephen Homer Miller, MD 12/18/2015 10:30 AM

## 2015-12-19 MED ORDER — OXYCODONE-ACETAMINOPHEN 5-325 MG PO TABS: 2.0000 | ORAL_TABLET | ORAL | 0 refills | Status: DC | PRN

## 2015-12-19 MED ORDER — IBUPROFEN 600 MG PO TABS: 600.0000 mg | ORAL_TABLET | Freq: Four times a day (QID) | ORAL | 0 refills | Status: DC

## 2015-12-19 MED ORDER — METHYLDOPA 250 MG PO TABS
250.0000 mg | ORAL_TABLET | Freq: Two times a day (BID) | ORAL | Status: DC
  Administered 2015-12-19: 250 mg via ORAL
  Filled 2015-12-19: qty 1

## 2015-12-19 NOTE — Progress Notes (Signed)
Discharge instructions complete and prescriptions given. Patient verbalizes understanding of teaching. 

## 2015-12-19 NOTE — Progress Notes (Signed)
Patient discharged at 1420.

## 2015-12-19 NOTE — Progress Notes (Signed)
Dr. Jean RosenthalJackson notified of  Last two elevated B/P's.  Pt. Is alert and oriented with cheerful affect. Color good, skin w&d. Denies H/A, Blurred Vision, epigastric pain or spots before her eyes. Pt. Has a History of CHTN and takes Methyldopa at home. Dr. Jean RosenthalJackson stated that he would most likely restart her home dose in the  A.M. Pt. Informed and V/O.

## 2016-03-08 ENCOUNTER — Ambulatory Visit (INDEPENDENT_AMBULATORY_CARE_PROVIDER_SITE_OTHER): Payer: 59 | Admitting: Obstetrics and Gynecology

## 2016-03-08 ENCOUNTER — Encounter: Payer: Self-pay | Admitting: Obstetrics and Gynecology

## 2016-03-08 VITALS — BP 122/72 | Ht 60.0 in | Wt 234.0 lb

## 2016-03-08 DIAGNOSIS — Z30017 Encounter for initial prescription of implantable subdermal contraceptive: Secondary | ICD-10-CM

## 2016-03-08 NOTE — Progress Notes (Signed)
       GYNECOLOGY PROCEDURE NOTE  Patient is a 36 y.o. [id number] presenting for Nexplanon insertion as her desires means of contraception.  She provided informed consent, signed copy in the chart, time out was performed. Pregnancy test was negative, with self reported LMP of Patient's last menstrual period was 02/27/2016.  She understands that Nexplanon is a progesterone only therapy, and that patients often patients have irregular and unpredictable vaginal bleeding or amenorrhea. She understands that other side effects are possible related to systemic progesterone, including but not limited to, headaches, breast tenderness, nausea, and irritability. While effective at preventing pregnancy long acting reversible contraceptives do not prevent transmission of sexually transmitted diseases and use of barrier methods for this purpose was discussed. The placement procedure for Nexplanon was reviewed with the patient in detail including risks of nerve injury, infection, bleeding and injury to other muscles or tendons. She understands that the Nexplanon implant is good for 3 years and needs to be removed at the end of that time.  She understands that Nexplanon is an extremely effective option for contraception, with failure rate of <1%. This information is reviewed today and all questions were answered. Informed consent was obtained, both verbally and written.   The patient is healthy and has no contraindications to Implanon use. Urine pregnancy test was performed today and was negative.  Procedure Appropriate time out taken.  Patient placed in dorsal supine with left arm above head, elbow flexed at 90 degrees, arm resting on examination table.  The bicipital grove was palpated and site 8-10cm proximal to the medial epicondyle was indentified . The insertion site was prepped with a two betadine swabs and then injected with 3 cc of 2% lidocaine without epinephrine.  Nexplanon removed form sterile blister  packaging,  Device confirmed in needle, before inserting full length of needle, tenting up the skin as the needle was advance.  The drug eluting rod was then deployed by pulling back the slider per the manufactures recommendation.  The implant was palpable by the clinician as well as the patient.  The insertion site covered dressed with a band aid before applying  a kerlex bandage pressure dressing..Minimal blood loss was noted during the procedure.  The patient tolerated the procedure well.   She was instructed to wear the bandage for 24 hours, call with any signs of infection.  She was given the Nexplanon card and instructed to have the rod removed in 3 years.  Thomasene MohairStephen Markisha Meding, MD 03/08/2016 4:17 PM

## 2016-04-07 ENCOUNTER — Other Ambulatory Visit: Payer: Self-pay | Admitting: Family Medicine

## 2016-08-30 ENCOUNTER — Encounter: Payer: Self-pay | Admitting: *Deleted

## 2016-08-31 ENCOUNTER — Encounter: Payer: Self-pay | Admitting: Family Medicine

## 2016-08-31 ENCOUNTER — Encounter (INDEPENDENT_AMBULATORY_CARE_PROVIDER_SITE_OTHER): Payer: Self-pay

## 2016-08-31 ENCOUNTER — Ambulatory Visit (INDEPENDENT_AMBULATORY_CARE_PROVIDER_SITE_OTHER): Payer: 59 | Admitting: Family Medicine

## 2016-08-31 VITALS — BP 140/94 | HR 89 | Temp 98.4°F | Ht 60.0 in | Wt 233.8 lb

## 2016-08-31 DIAGNOSIS — J9801 Acute bronchospasm: Secondary | ICD-10-CM | POA: Diagnosis not present

## 2016-08-31 MED ORDER — BENZONATATE 200 MG PO CAPS: 200.0000 mg | ORAL_CAPSULE | Freq: Three times a day (TID) | ORAL | 2 refills | Status: DC | PRN

## 2016-08-31 MED ORDER — METHYLDOPA 250 MG PO TABS: 250.0000 mg | ORAL_TABLET | Freq: Two times a day (BID) | ORAL | 3 refills | Status: DC

## 2016-08-31 NOTE — Progress Notes (Signed)
Dr. Karleen Hampshire T. Solomiya Pascale, MD, CAQ Sports Medicine [facility] [address] [place], [address] Phone: [phone] Fax: [phone]  08/31/2016  Patient: Lacey Rodgers, MRN: [id number], DOB: 08/28/1980, 36 y.o.  Primary Physician:  [name], MD   Chief Complaint  Patient presents with  . Follow-up    Walking PNA-Seen at [facility] 2 weeks ago. Can't get rid of cough  . Medication Refill    methyldopa   Subjective:   Lacey Rodgers is a 36 y.o. very pleasant female patient who presents with the following:  Sick for a couple of months.  Had her Tdap. Her son was sick and then both she and her husband got sick. She has had persistent pulmonary symptoms, she was seen 2 weeks ago and got a course of doxycycline. She does feel better compared to then, but she says had a persistent nonproductive cough.   Past Medical History, Surgical History, Social History, Family History, Problem List, Medications, and Allergies have been reviewed and updated if relevant.  Patient Active Problem List   Diagnosis Date Noted  . Migraine aura, persistent 10/10/2012  . GENITAL HERPES 10/23/2008  . DEPRESSION 10/23/2008  . COMMON MIGRAINE 10/23/2008  . Essential hypertension 10/23/2008  . GERD 10/23/2008    Past Medical History:  Diagnosis Date  . Depression   . Genital herpes   . GERD (gastroesophageal reflux disease)   . Hypertension   . Migraine     Past Surgical History:  Procedure Laterality Date  . CESAREAN SECTION N/A 12/16/2015   Procedure: CESAREAN SECTION;  Surgeon: Conard Novak, MD;  Location: [facility] ORS;  Service: Obstetrics;  Laterality: N/A;  . DILATION AND CURETTAGE OF UTERUS  01/2013    Social History   Social History  . Marital status: Married    Spouse name: N/A  . Number of children: N/A  . Years of education: N/A   Occupational History  . Not on file.   Social History Main Topics  . Smoking status: Never Smoker   . Smokeless tobacco: Never Used  . Alcohol use No     Comment: occ  . Drug use: No  . Sexual activity: Yes   Other Topics Concern  . Not on file   Social History Narrative  . No narrative on file    No family history on file.  Allergies  Allergen Reactions  . Pseudoeph-Doxylamine-Dm-Apap Hives    Medication list reviewed and updated in full in [facility].   GEN: No acute illnesses, no fevers, chills. GI: No n/v/d, eating normally Pulm: No SOB Interactive and getting along well at home.  Otherwise, ROS is as per the HPI.  Objective:   BP (!) 140/94   Pulse 89   Temp 98.4 F (36.9 C) (Oral)   Ht 5' (1.524 m)   Wt 233 lb 12 oz (106 kg)   SpO2 97%   BMI 45.65 kg/m   GEN: WDWN, NAD, Non-toxic, A & O x 3 HEENT: Atraumatic, Normocephalic. Neck supple. No masses, No LAD. Ears and Nose: No external deformity. CV: RRR, No M/G/R. No JVD. No thrill. No extra heart sounds. PULM: CTA B, no wheezes, crackles, rhonchi. No retractions. No resp. distress. No accessory muscle use. EXTR: No c/c/e NEURO Normal gait.  PSYCH: Normally interactive. Conversant. Not depressed or anxious appearing.  Calm demeanor.   Laboratory and Imaging Data:  Assessment and Plan:   Post-infection bronchospasm  Reassurance, supportive care.  Follow-up: No  Follow-up on file.  No future appointments.  Meds ordered this encounter  Medications  . benzonatate (TESSALON) 200 MG capsule    Sig: Take 1 capsule (200 mg total) by mouth 3 (three) times daily as needed for cough.    Dispense:  60 capsule    Refill:  2  . methyldopa (ALDOMET) 250 MG tablet    Sig: Take 1 tablet (250 mg total) by mouth 2 (two) times daily.    Dispense:  180 tablet    Refill:  3   Medications Discontinued During This Encounter  Medication Reason  . Prenatal Vit-Fe Fumarate-FA (MULTIVITAMIN-PRENATAL) 27-0.8 MG TABS tablet Completed Course  . oxyCODONE-acetaminophen (PERCOCET/ROXICET) 5-325 MG tablet  Completed Course  . ibuprofen (ADVIL,MOTRIN) 600 MG tablet Completed Course  . escitalopram (LEXAPRO) 20 MG tablet Completed Course  . acetaminophen (TYLENOL) 500 MG chewable tablet Completed Course  . methyldopa (ALDOMET) 250 MG tablet Reorder    Signed,  Karleen HampshireSpencer T. Matthias Bogus, MD   Patient's Medications  New Prescriptions   BENZONATATE (TESSALON) 200 MG CAPSULE    Take 1 capsule (200 mg total) by mouth 3 (three) times daily as needed for cough.  Previous Medications   ASCORBIC ACID (VITAMIN C) 100 MG TABLET    Take 100 mg by mouth daily.   MAGNESIUM OXIDE (MAG-OX) 400 MG TABLET    Take 400 mg by mouth daily.   OMEPRAZOLE (PRILOSEC) 20 MG CAPSULE    Take 20 mg by mouth daily.  Modified Medications   Modified Medication Previous Medication   METHYLDOPA (ALDOMET) 250 MG TABLET methyldopa (ALDOMET) 250 MG tablet      Take 1 tablet (250 mg total) by mouth 2 (two) times daily.    TAKE ONE TABLET BY MOUTH TWICE DAILY  Discontinued Medications   ACETAMINOPHEN (TYLENOL) 500 MG CHEWABLE TABLET    Chew 1,000 mg by mouth every 6 (six) hours as needed for pain.   ESCITALOPRAM (LEXAPRO) 20 MG TABLET    Take 20 mg by mouth daily.   IBUPROFEN (ADVIL,MOTRIN) 600 MG TABLET    Take 1 tablet (600 mg total) by mouth every 6 (six) hours.   OXYCODONE-ACETAMINOPHEN (PERCOCET/ROXICET) 5-325 MG TABLET    Take 2 tablets by mouth every 4 (four) hours as needed for severe pain (pain score >7/10).   PRENATAL VIT-FE FUMARATE-FA (MULTIVITAMIN-PRENATAL) 27-0.8 MG TABS TABLET    Take 1 tablet by mouth daily at 12 noon.

## 2016-09-01 ENCOUNTER — Telehealth: Payer: Self-pay | Admitting: *Deleted

## 2016-09-01 NOTE — Telephone Encounter (Signed)
[facility] Meth[facility]ld[facility] is on [employer] Backorder.  Please Advise.

## 2016-09-05 NOTE — Telephone Encounter (Signed)
Can you call - her methldopa is on national backorder (BP medicine) ?  I wanted to double check that she is not trying to get pregnant or breastfeeding?  If not, we will need to change her to a different BP med.

## 2016-09-05 NOTE — Telephone Encounter (Signed)
Spoke with Navistar International CorporationStephanie.  She states she is no trying to get pregnant nor is she breast feeding at this time.

## 2016-09-05 NOTE — Telephone Encounter (Signed)
Left message for Lacey Rodgers to return my call. 

## 2016-09-06 MED ORDER — HYDROCHLOROTHIAZIDE 12.5 MG PO CAPS: 12.5000 mg | ORAL_CAPSULE | Freq: Every day | ORAL | 3 refills | Status: DC

## 2016-09-06 NOTE — Telephone Encounter (Signed)
HCTZ 12.5 mg sent into [facility] [address] as instructed by Dr. Patsy Lageropland.  Left message for Judeth CornfieldStephanie that prescription has been sent to her pharmacy.

## 2016-09-06 NOTE — Telephone Encounter (Signed)
Ok to d/c methyldopa  hctz 12.5 mg, 1 po daily  Electronically prescribe to the pharmacy of their choice. (May call in if pharmacy does not participate in electronic prescriptions) Call in #30, 11 refills. OR if they prefer a 90 day supply, #90 with 3 refills is OK, too Prescription instructions above

## 2016-09-11 ENCOUNTER — Encounter: Payer: Self-pay | Admitting: Family Medicine

## 2017-02-08 ENCOUNTER — Other Ambulatory Visit: Payer: Self-pay

## 2017-02-08 ENCOUNTER — Ambulatory Visit (INDEPENDENT_AMBULATORY_CARE_PROVIDER_SITE_OTHER): Payer: 59 | Admitting: Family Medicine

## 2017-02-08 ENCOUNTER — Encounter: Payer: Self-pay | Admitting: Family Medicine

## 2017-02-08 DIAGNOSIS — B9789 Other viral agents as the cause of diseases classified elsewhere: Secondary | ICD-10-CM

## 2017-02-08 DIAGNOSIS — J069 Acute upper respiratory infection, unspecified: Secondary | ICD-10-CM | POA: Diagnosis not present

## 2017-02-08 DIAGNOSIS — F331 Major depressive disorder, recurrent, moderate: Secondary | ICD-10-CM | POA: Diagnosis not present

## 2017-02-08 MED ORDER — ESCITALOPRAM OXALATE 10 MG PO TABS: 10.0000 mg | ORAL_TABLET | Freq: Every day | ORAL | 3 refills | Status: DC

## 2017-02-08 NOTE — Assessment & Plan Note (Signed)
Improving.. No current bacterial infection.  Symptomatic care.

## 2017-02-08 NOTE — Patient Instructions (Addendum)
Rest, fluids. Symptomatic care. Get flu shot  when feeling totally better. Restart  lexapro 10 mg at bedtime. Call if interested in counseling.

## 2017-02-08 NOTE — Assessment & Plan Note (Signed)
Offered counseling.  Restart Lexapro.. Follow up in 4 weeks for likely increase back to dose she was on in past 20 mg daily.

## 2017-02-08 NOTE — Progress Notes (Signed)
Subjective:    Patient ID: Lacey BorgStephanie Rodgers, female    DOB: 04-13-1980, 37 y.o.   MRN: [id number]  Depression         Associated symptoms include no headaches.  (hx of ear infections) Otalgia   There is pain in the right ear. This is a new problem. The current episode started 1 to 4 weeks ago. The problem has been gradually worsening. There has been no fever. Associated symptoms include coughing and a sore throat. Pertinent negatives include no headaches. Associated symptoms comments: Mild cough, slight congestion  no body aches  bilateral ear pain., right sided sore throat.. She has tried antibiotics and NSAIDs ( [facility].Marland Kitchen.  Dx with sinus infeciton.. treated with augmentin x 10 days(, finished yesterday) for the symptoms. The treatment provided moderate relief. There is no history of hearing loss or a tympanostomy tube. hx of ear infections    Husband with flu.Marland Kitchen. Positive test.   Depression, major recurrent and GAD.  She has had multiple miscarriages in last 4 years.. Depression has been worsening over time.  Crys all the time.. Gradually worsening in last 6-9 months. After going back to work.  Was on lexapro for post partum depression but has been off some time. Previously helped with mood.  Trouble falling asleep at night.. But has < 36 year old.  PHQ9: 21.  Review of Systems  HENT: Positive for ear pain and sore throat.   Respiratory: Positive for cough.   Neurological: Negative for headaches.  Psychiatric/Behavioral: Positive for depression.   Blood pressure 124/84, pulse 94, temperature 98.7 F (37.1 C), temperature source Oral, height 5' (1.524 m), weight 228 lb 8 oz (103.6 kg), unknown if currently breastfeeding.     Objective:   Physical Exam  Constitutional: Vital signs are normal. She appears well-developed and well-nourished. She is cooperative.  Non-toxic appearance. She does not appear ill. No distress.  HENT:  Head: Normocephalic.  Right Ear:  Hearing, tympanic membrane, external ear and ear canal normal. Tympanic membrane is not erythematous, not retracted and not bulging.  Left Ear: Hearing, tympanic membrane, external ear and ear canal normal. Tympanic membrane is not erythematous, not retracted and not bulging.  Nose: No mucosal edema or rhinorrhea. Right sinus exhibits no maxillary sinus tenderness and no frontal sinus tenderness. Left sinus exhibits no maxillary sinus tenderness and no frontal sinus tenderness.  Mouth/Throat: Uvula is midline, oropharynx is clear and moist and mucous membranes are normal.  Eyes: Conjunctivae, EOM and lids are normal. Pupils are equal, round, and reactive to light. Lids are everted and swept, no foreign bodies found.  Neck: Trachea normal and normal range of motion. Neck supple. Carotid bruit is not present. No thyroid mass and no thyromegaly present.  Cardiovascular: Normal rate, regular rhythm, S1 normal, S2 normal, normal heart sounds, intact distal pulses and normal pulses. Exam reveals no gallop and no friction rub.  No murmur heard. Pulmonary/Chest: Effort normal and breath sounds normal. No tachypnea. No respiratory distress. She has no decreased breath sounds. She has no wheezes. She has no rhonchi. She has no rales.  Abdominal: Soft. Normal appearance and bowel sounds are normal. There is no tenderness.  Neurological: She is alert.  Skin: Skin is warm, dry and intact. No rash noted.  Psychiatric: Her speech is normal. Judgment and thought content normal. Her mood appears not anxious. She is slowed and withdrawn. Cognition and memory are normal. She exhibits a depressed mood. She expresses no homicidal  and no suicidal ideation. She expresses no suicidal plans and no homicidal plans.          Assessment & Plan:

## 2017-02-21 ENCOUNTER — Other Ambulatory Visit: Payer: Self-pay | Admitting: Obstetrics and Gynecology

## 2017-03-06 ENCOUNTER — Ambulatory Visit: Payer: 59 | Admitting: Family Medicine

## 2017-03-08 ENCOUNTER — Encounter: Payer: Self-pay | Admitting: Family Medicine

## 2017-03-08 ENCOUNTER — Other Ambulatory Visit: Payer: Self-pay

## 2017-03-08 ENCOUNTER — Telehealth: Payer: Self-pay | Admitting: *Deleted

## 2017-03-08 ENCOUNTER — Ambulatory Visit (INDEPENDENT_AMBULATORY_CARE_PROVIDER_SITE_OTHER): Payer: 59 | Admitting: Family Medicine

## 2017-03-08 DIAGNOSIS — M7582 Other shoulder lesions, left shoulder: Secondary | ICD-10-CM | POA: Insufficient documentation

## 2017-03-08 DIAGNOSIS — F331 Major depressive disorder, recurrent, moderate: Secondary | ICD-10-CM | POA: Diagnosis not present

## 2017-03-08 MED ORDER — DICLOFENAC SODIUM 75 MG PO TBEC: 75.0000 mg | DELAYED_RELEASE_TABLET | Freq: Two times a day (BID) | ORAL | 0 refills | Status: DC

## 2017-03-08 MED ORDER — ESCITALOPRAM OXALATE 20 MG PO TABS: 10.0000 mg | ORAL_TABLET | Freq: Every day | ORAL | 5 refills | Status: DC

## 2017-03-08 NOTE — Progress Notes (Signed)
Subjective:    Patient ID: Lacey Rodgers, female    DOB: 10-26-1980, 37 y.o.   MRN: [id number]  HPI    37 year old female pt presents for 1 month follow up on MDD, moderate.   At last OV 1 month ago she was  Restarted on Lexapro 10 mg daily. ( in past she required 20 mg daily to control symptoms)  Today she reports some improvement overall. She is still having trouble staying asleep. No SE to lexapro.  She does report 50 % improvement overall.  She has also had some increased stress in last month with death of coworker.   PHQ9 today 15.. Down from 46.    She also notes a new issue since the delivery of her son, carries son in left arm, 22 months old. She has had pain in left posterior shoulder ache. Pain with abduction. No pain with int and ext rotation.  No numbness, no weakness.  No neck pain.  She has been ibuprofen 600 mg off an on. No fall.    Blood pressure 110/80, pulse 93, temperature 98.4 F (36.9 C), temperature source Oral, height 5' (1.524 m), weight 227 lb 8 oz (103.2 kg), unknown if currently breastfeeding.    Review of Systems  Constitutional: Negative for fatigue and fever.  HENT: Negative for congestion.   Eyes: Negative for pain.  Respiratory: Negative for cough and shortness of breath.   Cardiovascular: Negative for chest pain, palpitations and leg swelling.  Gastrointestinal: Negative for abdominal pain.  Genitourinary: Negative for dysuria and vaginal bleeding.  Musculoskeletal: Positive for arthralgias. Negative for back pain.  Neurological: Negative for syncope, light-headedness and headaches.  Psychiatric/Behavioral: Negative for dysphoric mood.       Objective:   Physical Exam  Constitutional: Vital signs are normal. She appears well-developed and well-nourished. She is cooperative.  Non-toxic appearance. She does not appear ill. No distress.  HENT:  Head: Normocephalic.  Right Ear: Hearing, tympanic membrane, external ear and ear canal  normal. Tympanic membrane is not erythematous, not retracted and not bulging.  Left Ear: Hearing, tympanic membrane, external ear and ear canal normal. Tympanic membrane is not erythematous, not retracted and not bulging.  Nose: No mucosal edema or rhinorrhea. Right sinus exhibits no maxillary sinus tenderness and no frontal sinus tenderness. Left sinus exhibits no maxillary sinus tenderness and no frontal sinus tenderness.  Mouth/Throat: Uvula is midline, oropharynx is clear and moist and mucous membranes are normal.  Eyes: Conjunctivae, EOM and lids are normal. Pupils are equal, round, and reactive to light. Lids are everted and swept, no foreign bodies found.  Neck: Trachea normal and normal range of motion. Neck supple. Carotid bruit is not present. No thyroid mass and no thyromegaly present.  Cardiovascular: Normal rate, regular rhythm, S1 normal, S2 normal, normal heart sounds, intact distal pulses and normal pulses. Exam reveals no gallop and no friction rub.  No murmur heard. Pulmonary/Chest: Effort normal and breath sounds normal. No tachypnea. No respiratory distress. She has no decreased breath sounds. She has no wheezes. She has no rhonchi. She has no rales.  Abdominal: Soft. Normal appearance and bowel sounds are normal. There is no tenderness.  Musculoskeletal:       Right shoulder: Normal.       Left shoulder: She exhibits decreased range of motion and tenderness. She exhibits no swelling, no effusion and no deformity.    left shoulder: Neg drop arm Positive neers  neg spurling  Neurological: She is alert.  She has normal strength. No sensory deficit. Coordination normal.  Skin: Skin is warm, dry and intact. No rash noted.  Psychiatric: Her speech is normal. Judgment and thought content normal. Her mood appears not anxious. She is withdrawn. Cognition and memory are normal. She exhibits a depressed mood.          Assessment & Plan:

## 2017-03-08 NOTE — Patient Instructions (Addendum)
Increase lexapro to 20 mg daily. Diclofenac twice daily x 2 weeks for pain and inflammation, Ice and home PT. Call if not improving in 2 weeks.

## 2017-03-08 NOTE — Assessment & Plan Note (Signed)
NSAIDs, Ice and home PT. Call if not improving in 2 weeks.  Stop lifting child in left arm.

## 2017-03-08 NOTE — Telephone Encounter (Signed)
Per Dr. Ermalene SearingBedsole:  Ok to fill Diclofenac because it is a short term medication.  [facility] notified via fax.

## 2017-03-08 NOTE — Assessment & Plan Note (Signed)
Increase lexapro to 20 mg daily. Follow up prn.

## 2017-03-08 NOTE — Telephone Encounter (Signed)
Noted. No med change needed. Not a long term med.

## 2017-03-08 NOTE — Telephone Encounter (Signed)
[facility] see attached sheet concerning a drug interaction between diclofenac and escitalopram.  Fax placed in Dr. Daphine DeutscherBedsole's in box to review.  Please advise.

## 2017-05-25 ENCOUNTER — Ambulatory Visit (INDEPENDENT_AMBULATORY_CARE_PROVIDER_SITE_OTHER): Payer: 59 | Admitting: Family Medicine

## 2017-05-25 ENCOUNTER — Encounter: Payer: Self-pay | Admitting: Family Medicine

## 2017-05-25 VITALS — BP 126/86 | HR 83 | Temp 98.2°F | Ht 60.0 in | Wt 231.0 lb

## 2017-05-25 DIAGNOSIS — R21 Rash and other nonspecific skin eruption: Secondary | ICD-10-CM | POA: Diagnosis not present

## 2017-05-25 MED ORDER — TRIAMCINOLONE ACETONIDE 0.1 % EX CREA: 1.0000 "application " | TOPICAL_CREAM | Freq: Two times a day (BID) | CUTANEOUS | 0 refills | Status: DC

## 2017-05-25 MED ORDER — VALACYCLOVIR HCL 1 G PO TABS: 1000.0000 mg | ORAL_TABLET | Freq: Three times a day (TID) | ORAL | 0 refills | Status: DC

## 2017-05-25 NOTE — Progress Notes (Signed)
BP 126/86 (BP Location: Left Arm, Patient Position: Sitting, Cuff Size: Large)   Pulse 83   Temp 98.2 F (36.8 C) (Oral)   Ht 5' (1.524 m)   Wt 231 lb (104.8 kg)   SpO2 97%   BMI 45.11 kg/m    CC: rash Subjective:    Patient ID: Lacey Rodgers, female    DOB: Apr 13, 1980, 37 y.o.   MRN: [id number]  HPI: Lacey Rodgers is a 37 y.o. female presenting on 05/25/2017 for Rash (Located on left side of mid back. Area sometimes burns or itches. Also, has itchy bumps on UEs she thinks may be an allergic reaction to sunscreen. )   3d ago noticed rash of erythematous cluster on left back, tender and itchy and burning. May have been blistering. She did not have paresthesias prior to breaking out.   No new lotions, detergents, soaps or shampoos. No new medicines. No new foods.  H/o chicken pox growing up.  [place] camp[place].   She did recently break out on arms after using sunscreen (coppertone). Recent sun exposure at [facility].   Relevant past medical, surgical, family and social history reviewed and updated as indicated. Interim medical history since our last visit reviewed. Allergies and medications reviewed and updated. Outpatient Medications Prior to Visit  Medication Sig Dispense Refill  . Ascorbic Acid (VITAMIN C) 100 MG tablet Take 100 mg by mouth daily.    Marland Kitchen escitalopram (LEXAPRO) 20 MG tablet Take 0.5 tablets (10 mg total) by mouth daily. 30 tablet 5  . Etonogestrel (NEXPLANON [place]) Inject into the skin.    . hydrochlorothiazide (MICROZIDE) 12.5 MG capsule Take 1 capsule (12.5 mg total) by mouth daily. 90 capsule 3  . magnesium oxide (MAG-OX) 400 MG tablet Take 400 mg by mouth daily.    Marland Kitchen omeprazole (PRILOSEC) 20 MG capsule Take 20 mg by mouth daily.    Marland Kitchen levonorgestrel (MIRENA) 20 MCG/24HR IUD 1 each by Intrauterine route once.    . valACYclovir (VALTREX) 500 MG tablet TAKE 1 CAPLET BY MOUTH TWICE DAILY FOR 3 DAYS 6 tablet 0  . benzonatate (TESSALON) 200 MG  capsule Take 1 capsule (200 mg total) by mouth 3 (three) times daily as needed for cough. 60 capsule 2  . diclofenac (VOLTAREN) 75 MG EC tablet Take 1 tablet (75 mg total) by mouth 2 (two) times daily. 30 tablet 0   No facility-administered medications prior to visit.      Per HPI unless specifically indicated in ROS section below Review of Systems     Objective:    BP 126/86 (BP Location: Left Arm, Patient Position: Sitting, Cuff Size: Large)   Pulse 83   Temp 98.2 F (36.8 C) (Oral)   Ht 5' (1.524 m)   Wt 231 lb (104.8 kg)   SpO2 97%   BMI 45.11 kg/m   Wt Readings from Last 3 Encounters:  05/25/17 231 lb (104.8 kg)  03/08/17 227 lb 8 oz (103.2 kg)  02/08/17 228 lb 8 oz (103.6 kg)    Physical Exam  Constitutional: She appears well-developed and well-nourished. No distress.  Skin: Skin is warm and dry. There is erythema.     Erythematous patch L back below scapula wit possible dried vesicles. Pruritic.   Nursing note and vitals reviewed.      Assessment & Plan:   Problem List Items Addressed This Visit    Skin rash - Primary    Resolving shingles vs contact dermatitis. rec TCI cream  BID x 2 wks. If new vesicular lesions, spreading, provided with WASP to fill valtrex 1 wk course. Update if not improving with treatment.           Meds ordered this encounter  Medications  . triamcinolone cream (KENALOG) 0.1 %    Sig: Apply 1 application topically 2 (two) times daily. Apply to AA.    Dispense:  30 g    Refill:  0  . valACYclovir (VALTREX) 1000 MG tablet    Sig: Take 1 tablet (1,000 mg total) by mouth 3 (three) times daily.    Dispense:  21 tablet    Refill:  0   No orders of the defined types were placed in this encounter.   Follow up plan: No follow-ups on file.  Eustaquio Boyden, MD

## 2017-05-25 NOTE — Assessment & Plan Note (Signed)
Resolving shingles vs contact dermatitis. rec TCI cream BID x 2 wks. If new vesicular lesions, spreading, provided with WASP to fill valtrex 1 wk course. Update if not improving with treatment.

## 2017-05-25 NOTE — Patient Instructions (Signed)
This may be shingles, but more likely a contact dermatitis.  Treat with topical steroid cream sent to pharmacy twice daily for 1-2 weeks.  If new blistering lesions develop, fill shingles medicine (valtrex).

## 2017-06-06 ENCOUNTER — Other Ambulatory Visit
Admission: RE | Admit: 2017-06-06 | Discharge: 2017-06-06 | Disposition: A | Discharge status: Home / Self Care | Payer: 59 | Source: Ambulatory Visit | Attending: Family Medicine | Admitting: Family Medicine

## 2017-06-06 DIAGNOSIS — M7989 Other specified soft tissue disorders: Secondary | ICD-10-CM | POA: Diagnosis not present

## 2017-06-06 LAB — FIBRIN DERIVATIVES D-DIMER (ARMC ONLY): Fibrin derivatives D-dimer (ARMC): 288.02 ng/mL (FEU) (ref 0.00–499.00)

## 2017-06-20 ENCOUNTER — Other Ambulatory Visit: Payer: Self-pay | Admitting: Obstetrics and Gynecology

## 2017-10-09 ENCOUNTER — Encounter: Payer: Self-pay | Admitting: Obstetrics & Gynecology

## 2017-10-09 ENCOUNTER — Ambulatory Visit (INDEPENDENT_AMBULATORY_CARE_PROVIDER_SITE_OTHER): Payer: 59 | Admitting: Obstetrics & Gynecology

## 2017-10-09 VITALS — BP 120/80 | Ht 60.0 in | Wt 220.0 lb

## 2017-10-09 DIAGNOSIS — Z3049 Encounter for surveillance of other contraceptives: Secondary | ICD-10-CM

## 2017-10-09 DIAGNOSIS — Z3046 Encounter for surveillance of implantable subdermal contraceptive: Secondary | ICD-10-CM

## 2017-10-09 NOTE — Patient Instructions (Signed)
Nexplanon Instructions After Removal  Keep bandage clean and dry for 24 hours  May use ice/Tylenol/Ibuprofen for soreness or pain  If you develop fever, drainage or increased warmth from incision site-contact office immediately   

## 2017-10-09 NOTE — Progress Notes (Signed)
  Nexplanon removal Procedure note - The Nexplanon was noted in the patient's arm and the end was identified. The skin was cleansed with a Betadine solution. A small injection of subcutaneous lidocaine with epinephrine was given over the end of the implant. An incision was made at the end of the implant. The rod was noted in the incision and grasped with a hemostat. It was noted to be intact.  Steri-Strip was placed approximating the incision. Hemostasis was noted.  Plans for pregnancy in near future H/o infertility, requiring meds for pregnancy last time  Letitia Libra ,MD 10/09/2017,1:30 PM

## 2017-10-15 ENCOUNTER — Other Ambulatory Visit: Payer: Self-pay | Admitting: Family Medicine

## 2017-10-16 MED ORDER — HYDROCHLOROTHIAZIDE 12.5 MG PO CAPS: 12.5000 mg | ORAL_CAPSULE | Freq: Every day | ORAL | 0 refills | Status: DC

## 2017-10-16 NOTE — Addendum Note (Signed)
Addended by: Desmond Dike on: 10/16/2017 12:26 PM   Modules accepted: Orders

## 2017-10-16 NOTE — Telephone Encounter (Signed)
Rx sent for #30

## 2017-10-16 NOTE — Telephone Encounter (Signed)
Patient has a appt schedule for 10-24-17 with Dr.copland for a BP check, she is requesting a refill until her schedule appt. Please advise

## 2017-10-24 ENCOUNTER — Ambulatory Visit: Payer: 59 | Admitting: Family Medicine

## 2017-10-24 ENCOUNTER — Ambulatory Visit (INDEPENDENT_AMBULATORY_CARE_PROVIDER_SITE_OTHER): Payer: 59 | Admitting: Family Medicine

## 2017-10-24 ENCOUNTER — Encounter: Payer: Self-pay | Admitting: Family Medicine

## 2017-10-24 VITALS — BP 130/82 | HR 80 | Temp 98.6°F | Ht 60.0 in | Wt 218.8 lb

## 2017-10-24 DIAGNOSIS — S01339A Puncture wound without foreign body of unspecified ear, initial encounter: Secondary | ICD-10-CM

## 2017-10-24 DIAGNOSIS — S01332A Puncture wound without foreign body of left ear, initial encounter: Secondary | ICD-10-CM

## 2017-10-24 DIAGNOSIS — I10 Essential (primary) hypertension: Secondary | ICD-10-CM

## 2017-10-24 DIAGNOSIS — L089 Local infection of the skin and subcutaneous tissue, unspecified: Secondary | ICD-10-CM | POA: Insufficient documentation

## 2017-10-24 MED ORDER — AMOXICILLIN 500 MG PO CAPS: 1000.0000 mg | ORAL_CAPSULE | Freq: Two times a day (BID) | ORAL | 0 refills | Status: DC

## 2017-10-24 MED ORDER — HYDROCHLOROTHIAZIDE 12.5 MG PO CAPS: 12.5000 mg | ORAL_CAPSULE | Freq: Every day | ORAL | 3 refills | Status: DC

## 2017-10-24 NOTE — Progress Notes (Signed)
Subjective:    Patient ID: Lacey Rodgers, female    DOB: [birth year], 37 y.o.   MRN: [id number]  HPI  37 year old female pt of Dr. Cyndie Chime presents for follow up HTN.  Hypertension:  Well controlled on HCTZ.  BP Readings from Last 3 Encounters:  10/24/17 130/82  10/09/17 120/80  05/25/17 126/86  Using medication without problems or lightheadedness: none Chest pain with exertion:none Edema:none Short of breath:none Average home BPs: not checking at home. Other issues:  She has been on keto diet.  Wt Readings from Last 3 Encounters:  10/24/17 218 lb 12 oz (99.2 kg)  10/09/17 220 lb (99.8 kg)  05/25/17 231 lb (104.8 kg)     She has left daith ear piercing at [place] 2 months ago... Healed well. She has noted in last week: yellow discharge, bloody discharge, swelling around site.  Area is painful, itchy.  No fever. Cleaning with ear piercing cleaner.   Social History /Family History/Past Medical History reviewed in detail and updated in EMR if needed. Blood pressure 130/82, pulse 80, temperature 98.6 F (37 C), temperature source Oral, height 5' (1.524 m), weight 218 lb 12 oz (99.2 kg), unknown if currently breastfeeding.   Review of Systems  Constitutional: Negative for fatigue and fever.  HENT: Negative for congestion.   Eyes: Negative for pain.  Respiratory: Negative for cough and shortness of breath.   Cardiovascular: Negative for chest pain, palpitations and leg swelling.  Gastrointestinal: Negative for abdominal pain.  Genitourinary: Negative for dysuria and vaginal bleeding.  Musculoskeletal: Negative for back pain.  Neurological: Negative for syncope, light-headedness and headaches.  Psychiatric/Behavioral: Negative for dysphoric mood.       Objective:   Physical Exam  Constitutional: Vital signs are normal. She appears well-developed and well-nourished. She is cooperative.  Non-toxic appearance. She does not appear ill. No distress.  HENT:  Head:  Normocephalic.  Right Ear: Hearing, tympanic membrane, external ear and ear canal normal.  Left Ear: Hearing, tympanic membrane, external ear and ear canal normal.  Ears:  Nose: Nose normal.  Area of peircing at diath.. Mild erythema, soreness and swelling at site.. No surrounding erythema, no abscess palpated.  Eyes: Pupils are equal, round, and reactive to light. Conjunctivae, EOM and lids are normal. Lids are everted and swept, no foreign bodies found.  Neck: Trachea normal and normal range of motion. Neck supple. Carotid bruit is not present. No thyroid mass and no thyromegaly present.  Cardiovascular: Normal rate, regular rhythm, S1 normal, S2 normal, normal heart sounds and intact distal pulses. Exam reveals no gallop.  No murmur heard. Pulmonary/Chest: Effort normal and breath sounds normal. No respiratory distress. She has no wheezes. She has no rhonchi. She has no rales.  Abdominal: Soft. Normal appearance and bowel sounds are normal. She exhibits no distension, no fluid wave, no abdominal bruit and no mass. There is no hepatosplenomegaly. There is no tenderness. There is no rebound, no guarding and no CVA tenderness. No hernia.  Lymphadenopathy:    She has no cervical adenopathy.    She has no axillary adenopathy.  Neurological: She is alert. She has normal strength. No cranial nerve deficit or sensory deficit.  Skin: Skin is warm, dry and intact. No rash noted.  Psychiatric: Her speech is normal and behavior is normal. Judgment normal. Her mood appears not anxious. Cognition and memory are normal. She does not exhibit a depressed mood.          Assessment & Plan:

## 2017-10-24 NOTE — Assessment & Plan Note (Signed)
Good control on HCTZ. Refilled.  Pt over due for labs eval.. Will return for fasting labs. Encouraged exercise, weight loss, healthy eating habits.

## 2017-10-24 NOTE — Assessment & Plan Note (Signed)
Treat with antibiotics oral and topical.  Warm compresses and clean regularly.

## 2017-10-24 NOTE — Patient Instructions (Signed)
Continue to clear ear piercing, apply topical antibiotic ointment.  Complete a course of antibiotics.Marland Kitchen amoxicillin x 10 days.  Call if fever on antibiotics or pain increasing, redness swelling.

## 2017-10-29 ENCOUNTER — Encounter: Payer: Self-pay | Admitting: *Deleted

## 2017-10-29 ENCOUNTER — Other Ambulatory Visit (INDEPENDENT_AMBULATORY_CARE_PROVIDER_SITE_OTHER): Payer: 59

## 2017-10-29 DIAGNOSIS — I10 Essential (primary) hypertension: Secondary | ICD-10-CM | POA: Diagnosis not present

## 2017-10-29 LAB — COMPREHENSIVE METABOLIC PANEL
ALBUMIN: 4.2 g/dL (ref 3.5–5.2)
ALT: 11 U/L (ref 0–35)
AST: 11 U/L (ref 0–37)
Alkaline Phosphatase: 39 U/L (ref 39–117)
BILIRUBIN TOTAL: 0.5 mg/dL (ref 0.2–1.2)
BUN: 17 mg/dL (ref 6–23)
CALCIUM: 9.5 mg/dL (ref 8.4–10.5)
CO2: 31 mEq/L (ref 19–32)
Chloride: 102 mEq/L (ref 96–112)
Creatinine, Ser: 0.85 mg/dL (ref 0.40–1.20)
GFR: 80.11 mL/min (ref >60.00)
Glucose, Bld: 99 mg/dL (ref 70–99)
Potassium: 3.7 mEq/L (ref 3.5–5.1)
Sodium: 140 mEq/L (ref 135–145)
Total Protein: 6.8 g/dL (ref 6.0–8.3)

## 2017-10-29 LAB — LIPID PANEL
CHOL/HDL RATIO: 4
CHOLESTEROL: 158 mg/dL (ref 0–200)
HDL: 45 mg/dL (ref >39.00)
LDL Cholesterol: 99 mg/dL (ref 0–99)
NonHDL: 113.09
TRIGLYCERIDES: 68 mg/dL (ref 0.0–149.0)
VLDL: 13.6 mg/dL (ref 0.0–40.0)

## 2017-12-22 ENCOUNTER — Other Ambulatory Visit: Payer: Self-pay | Admitting: Obstetrics and Gynecology

## 2018-01-01 ENCOUNTER — Other Ambulatory Visit: Payer: Self-pay | Admitting: Obstetrics and Gynecology

## 2018-01-17 ENCOUNTER — Other Ambulatory Visit: Payer: Self-pay | Admitting: Obstetrics and Gynecology

## 2018-01-17 NOTE — Telephone Encounter (Signed)
Advise

## 2018-01-17 NOTE — Telephone Encounter (Signed)
She should request the refill from the original prescriber.  Is she having an outbreak or does she need suppression?

## 2018-03-05 ENCOUNTER — Other Ambulatory Visit: Payer: Self-pay | Admitting: Family Medicine

## 2018-03-05 NOTE — Telephone Encounter (Signed)
Last office visit 10/24/2017 with Dr. Ermalene Searing for HTN.  Last refilled 05/25/2017 for #21 with no refills.  No future appointments.

## 2018-03-05 NOTE — Telephone Encounter (Signed)
This looks like it was rx by Dr. Reece Agar over the summer for possible shingles?  At 5 (and really at any age), it would be extremely unlikely to have 2 cases of shingles in 9 months.   I need more information.  Is this a painful vesicular rash like shingles?  If unclear at all, recommend office evaluation.

## 2018-03-06 ENCOUNTER — Encounter: Payer: Self-pay | Admitting: *Deleted

## 2018-03-06 MED ORDER — VALACYCLOVIR HCL 500 MG PO TABS: 500.0000 mg | ORAL_TABLET | Freq: Two times a day (BID) | ORAL | 5 refills | Status: DC

## 2018-03-06 NOTE — Telephone Encounter (Signed)
Rx faxed to [facility] on [address] at [phone]. 

## 2018-03-06 NOTE — Addendum Note (Signed)
Addended by: Damita Lack on: 03/06/2018 12:27 PM   Modules accepted: Orders

## 2018-03-06 NOTE — Telephone Encounter (Signed)
This encounter was created in error - please disregard.

## 2018-03-06 NOTE — Telephone Encounter (Signed)
Left message for Lacey Rodgers to call office or send MyChart message for why she is needing refill on Valcyclovir.

## 2018-03-14 ENCOUNTER — Other Ambulatory Visit: Payer: Self-pay

## 2018-03-14 ENCOUNTER — Ambulatory Visit
Admission: RE | Admit: 2018-03-14 | Discharge: 2018-03-14 | Disposition: A | Discharge status: Home / Self Care | Payer: PRIVATE HEALTH INSURANCE | Attending: Family Medicine | Admitting: Family Medicine

## 2018-03-14 ENCOUNTER — Other Ambulatory Visit: Payer: Self-pay | Admitting: Family Medicine

## 2018-03-14 ENCOUNTER — Ambulatory Visit
Admission: RE | Admit: 2018-03-14 | Discharge: 2018-03-14 | Disposition: A | Discharge status: Home / Self Care | Payer: PRIVATE HEALTH INSURANCE | Source: Ambulatory Visit | Attending: Family Medicine | Admitting: Family Medicine

## 2018-03-14 DIAGNOSIS — R52 Pain, unspecified: Secondary | ICD-10-CM

## 2018-03-15 ENCOUNTER — Other Ambulatory Visit: Payer: Self-pay

## 2018-03-15 ENCOUNTER — Ambulatory Visit (INDEPENDENT_AMBULATORY_CARE_PROVIDER_SITE_OTHER): Payer: No Typology Code available for payment source | Admitting: Family Medicine

## 2018-03-15 ENCOUNTER — Encounter: Payer: Self-pay | Admitting: Family Medicine

## 2018-03-15 VITALS — BP 124/84 | HR 90 | Temp 98.4°F | Ht 60.0 in | Wt 200.3 lb

## 2018-03-15 DIAGNOSIS — R05 Cough: Secondary | ICD-10-CM

## 2018-03-15 DIAGNOSIS — R059 Cough, unspecified: Secondary | ICD-10-CM | POA: Insufficient documentation

## 2018-03-15 LAB — POC INFLUENZA A&B (BINAX/QUICKVUE)
Influenza A, POC: NEGATIVE
Influenza B, POC: NEGATIVE

## 2018-03-15 MED ORDER — GUAIFENESIN-CODEINE 100-10 MG/5ML PO SYRP: 5.0000 mL | ORAL_SOLUTION | Freq: Two times a day (BID) | ORAL | 0 refills | Status: DC | PRN

## 2018-03-15 NOTE — Assessment & Plan Note (Signed)
No fever. Flu swab negative. Husband with flu several weeks ago. Anticipate flu like illness, not coronavirus given recent sick contact. Regardless, recommended stay at home until feeling better. Supportive care reviewed. Rx cheratussin for night time cough. Red flags to notify us reviewed as well.

## 2018-03-15 NOTE — Patient Instructions (Addendum)
You have an acute respiratory infection, likely viral, flu like illness.  Antibiotics are not needed for this. Viral infections usually take 7-10 days to resolve. The cough can last a few weeks to go away.  Use medication as prescribed: cheratussin codeine cough syrup for night time.  Push fluids and plenty of rest. May use ibuprofen for inflammation. May use plain mucinex or mucinex DM.  Please return if you are not improving as expected, or if you have high fevers (>101.5) or difficulty swallowing or worsening productive cough. Call clinic with questions.  Good to see you today. I hope you start feeling better soon.

## 2018-03-15 NOTE — Progress Notes (Signed)
BP 124/84 (BP Location: Left Arm, Patient Position: Sitting, Cuff Size: Normal)   Pulse 90   Temp 98.4 F (36.9 C) (Oral)   Ht 5' (1.524 m)   Wt 200 lb 5 oz (90.9 kg)   LMP 03/06/2018   SpO2 97%   BMI 39.12 kg/m    CC: cough Subjective:    Patient ID: Lacey Rodgers, female    DOB: Aug 18, 1980, 38 y.o.   MRN: [id number]  HPI: Lacey Rodgers is a 38 y.o. female presenting on 03/15/2018 for Cough (C/o somewhat productive cough, chest congestion and fever last night- max 99.8. Tried generic Mucinex DM, helpful. Pt accompanied by her husband, Christiane Ha. )   Intermittent cough/congestion over last few weeks, acute worsening yesterday when she woke up. "hit me like a ton of bricks". Acute onset body aches, headache, chest tightness with productive cough. Feverish Tmax 99.8. +ST and PNDrainage. L earache. Chest > head congestion.   No tooth pain, wheezing.   Treating with mucinex DM.   Husband had flu 3 wks ago.  She works at the hospital - service line.  No recent travel.  No h/o asthma.  No smokers at home.  She did receive flu shot this year.      Relevant past medical, surgical, family and social history reviewed and updated as indicated. Interim medical history since our last visit reviewed. Allergies and medications reviewed and updated. Outpatient Medications Prior to Visit  Medication Sig Dispense Refill  . Ascorbic Acid (VITAMIN C) 100 MG tablet Take 100 mg by mouth daily.    Marland Kitchen escitalopram (LEXAPRO) 20 MG tablet Take 0.5 tablets (10 mg total) by mouth daily. 30 tablet 5  . hydrochlorothiazide (MICROZIDE) 12.5 MG capsule Take 1 capsule (12.5 mg total) by mouth daily. 90 capsule 3  . magnesium oxide (MAG-OX) 400 MG tablet Take 400 mg by mouth daily.    Marland Kitchen omeprazole (PRILOSEC) 20 MG capsule Take 20 mg by mouth daily.    . valACYclovir (VALTREX) 500 MG tablet Take 1 tablet (500 mg total) by mouth 2 (two) times daily. 30 tablet 5  . amoxicillin (AMOXIL) 500 MG capsule Take  2 capsules (1,000 mg total) by mouth 2 (two) times daily. 40 capsule 0   No facility-administered medications prior to visit.      Per HPI unless specifically indicated in ROS section below Review of Systems Objective:    BP 124/84 (BP Location: Left Arm, Patient Position: Sitting, Cuff Size: Normal)   Pulse 90   Temp 98.4 F (36.9 C) (Oral)   Ht 5' (1.524 m)   Wt 200 lb 5 oz (90.9 kg)   LMP 03/06/2018   SpO2 97%   BMI 39.12 kg/m   Wt Readings from Last 3 Encounters:  03/15/18 200 lb 5 oz (90.9 kg)  10/24/17 218 lb 12 oz (99.2 kg)  10/09/17 220 lb (99.8 kg)    Physical Exam Vitals signs and nursing note reviewed.  Constitutional:      General: She is not in acute distress.    Appearance: Normal appearance. She is well-developed. She is not ill-appearing.     Comments: Tired appearing  HENT:     Head: Normocephalic and atraumatic.     Right Ear: Hearing, tympanic membrane, ear canal and external ear normal.     Left Ear: Hearing, tympanic membrane, ear canal and external ear normal.     Nose: Congestion and rhinorrhea present. No mucosal edema.     Right Sinus: No maxillary sinus  tenderness or frontal sinus tenderness.     Left Sinus: No maxillary sinus tenderness or frontal sinus tenderness.     Mouth/Throat:     Mouth: Mucous membranes are moist.     Pharynx: Oropharynx is clear. Uvula midline. No oropharyngeal exudate or posterior oropharyngeal erythema.     Tonsils: No tonsillar abscesses.  Eyes:     General: No scleral icterus.    Conjunctiva/sclera: Conjunctivae normal.     Pupils: Pupils are equal, round, and reactive to light.  Neck:     Musculoskeletal: Normal range of motion and neck supple.  Cardiovascular:     Rate and Rhythm: Normal rate and regular rhythm.     Pulses: Normal pulses.     Heart sounds: Normal heart sounds. No murmur.  Pulmonary:     Effort: Pulmonary effort is normal. No respiratory distress.     Breath sounds: Normal breath sounds. No  wheezing, rhonchi or rales.     Comments: Lungs clear, but cough present Lymphadenopathy:     Cervical: No cervical adenopathy.  Skin:    General: Skin is warm and dry.     Findings: No rash.  Neurological:     Mental Status: She is alert.       Results for orders placed or performed in visit on 03/15/18  POC Influenza A&B(BINAX/QUICKVUE)  Result Value Ref Range   Influenza A, POC Negative Negative   Influenza B, POC Negative Negative   Assessment & Plan:   Problem List Items Addressed This Visit    Cough - Primary    No fever. Flu swab negative. Husband with flu several weeks ago. Anticipate flu like illness, not coronavirus given recent sick contact. Regardless, recommended stay at home until feeling better. Supportive care reviewed. Rx cheratussin for night time cough. Red flags to notify us reviewed as well.       Relevant Orders   POC Influenza A&B(BINAX/QUICKVUE) (Completed)       Meds ordered this encounter  Medications  . guaiFENesin-codeine (CHERATUSSIN AC) 100-10 MG/5ML syrup    Sig: Take 5 mLs by mouth 2 (two) times daily as needed for cough (sedation precautions).    Dispense:  120 mL    Refill:  0   Orders Placed This Encounter  Procedures  . POC Influenza A&B(BINAX/QUICKVUE)    Patient Instructions  You have an acute respiratory infection, likely viral, flu like illness.  Antibiotics are not needed for this. Viral infections usually take 7-10 days to resolve. The cough can last a few weeks to go away.  Use medication as prescribed: cheratussin codeine cough syrup for night time.  Push fluids and plenty of rest. May use ibuprofen for inflammation. May use plain mucinex or mucinex DM.  Please return if you are not improving as expected, or if you have high fevers (>101.5) or difficulty swallowing or worsening productive cough. Call clinic with questions.  Good to see you today. I hope you start feeling better soon.    Follow up plan: Return if symptoms  worsen or fail to improve.  Eustaquio Boyden, MD

## 2018-04-10 ENCOUNTER — Encounter: Payer: Self-pay | Admitting: Obstetrics & Gynecology

## 2018-04-17 ENCOUNTER — Encounter: Payer: Self-pay | Admitting: Obstetrics & Gynecology

## 2018-04-17 ENCOUNTER — Other Ambulatory Visit: Payer: Self-pay

## 2018-04-17 ENCOUNTER — Ambulatory Visit (INDEPENDENT_AMBULATORY_CARE_PROVIDER_SITE_OTHER): Payer: No Typology Code available for payment source | Admitting: Obstetrics & Gynecology

## 2018-04-17 VITALS — Ht 59.0 in | Wt 194.0 lb

## 2018-04-17 DIAGNOSIS — N926 Irregular menstruation, unspecified: Secondary | ICD-10-CM

## 2018-04-17 DIAGNOSIS — Z3A01 Less than 8 weeks gestation of pregnancy: Secondary | ICD-10-CM

## 2018-04-17 DIAGNOSIS — O34219 Maternal care for unspecified type scar from previous cesarean delivery: Secondary | ICD-10-CM | POA: Insufficient documentation

## 2018-04-17 MED ORDER — LABETALOL HCL 200 MG PO TABS: 200.0000 mg | ORAL_TABLET | Freq: Two times a day (BID) | ORAL | 6 refills | Status: DC

## 2018-04-17 MED FILL — LABETALOL HCL 200 MG TABS: 200 | 30 days supply | Qty: 60 | Fill #0

## 2018-04-17 NOTE — Patient Instructions (Signed)
 COVID-19 and Your Pregnancy FAQ  How can I prevent infection with COVID-19 during my pregnancy? Social distancing is key. Please limit any interactions in public. Try and work from home if possible. Frequently wash your hands after touching possibly contaminated surfaces. Avoid touching your face.  Minimize trips to the store. Consider online ordering when possible.   Should I wear a mask? YES. It is recommended by the CDC that all people wear a cloth mask or facial covering in public. This will help reduce transmission as well as your risk or acquiring COVID-19. New studies are showing that even asymptomatic individuals can spread the virus from talking.   What are the symptoms of COVID-19? Fever (greater than 100.4 F), dry cough, shortness of breath.  Am I more at risk for COVID-19 since I am pregnant? There is not currently data showing that pregnant women are more adversely impacted by COVID-19 than the general population. However, we know that pregnant women tend to have worse respiratory complications from similar diseases such as the flu and SARS and for this reason should be considered an at-risk population.  What do I do if I am experiencing the symptoms of COVID-19? Testing is being limited because of test availability. If you are experiencing symptoms you should quarantine yourself, and the members of your family, for at least 2 weeks at home.   Please visit this website for more information: https://www.cdc.gov/coronavirus/2019-ncov/if-you-are-sick/steps-when-sick.html  When should I go to the Emergency Room? Please go to the emergency room if you are experiencing ANY of these symptoms*:  1.    Difficulty breathing or shortness of breath 2.    Persistent pain or pressure in the chest 3.    Confusion or difficulty being aroused (or awakened) 4.    Bluish lips or face  *This list is not all inclusive. Please consult our office for any other symptoms that are severe or  concerning.  What do I do if I am having difficulty breathing? You should go to the Emergency Room for evaluation. At this time they have a tent set up for evaluating patients with COVID-19 symptoms.   How will my prenatal care be different because of the COVID-19 pandemic? It has been recommended to reduce the frequency of face-to-face visits and use resources such as telephone and virtual visits when possible. Using a scale, blood pressure machine and fetal doppler at home can further help reduce face-to-face visits. You will be provided with additional information on this topic.  We ask that you come to your visits alone to minimize potential exposures to  COVID-19.  How can I receive childbirth education? At this time in-person classes have been cancelled. You can register for online childbirth education, breastfeeding, and newborn care classes.  Please visit:  [email or web address] for more information  How will my hospital birth experience be different? The hospital is currently limiting visitors. This means that while you are in labor you can only have one person at the hospital with you. Additional family members will not be allowed to wait in the building or outside your room. Your one support person can be the father of the baby, a relative, a doula, or a friend. Once one support person is designated that person will wear a band. This band cannot be shared with multiple people.  Nitrous Gas is not being offered for pain relief since the tubing and filter for the machine can not be sanitized in a way to guarantee prevention of transmission of COVID-19.    Nasal cannula use of oxygen for fetal indications has also been discontinued.  Currently a clear plastic sheet is being hung between mom and the delivering provider during pushing and delivery to help prevent transmission of COVID-19.      How long will I stay in the hospital for after giving birth? It is also recommended that  discharge home be expedited during the COVID-19 outbreak. This means staying for 1 day after a vaginal delivery and 2 days after a cesarean section. Patients who need to stay longer for medical reasons are allowed to do so, but the goal will be for expedited discharge home.   What if I have COVID-19 and I am in labor? We ask that you wear a mask while on labor and delivery. We will try and accommodate you being placed in a room that is capable of filtering the air. Please call ahead if you are in labor and on your way to the hospital. The phone number for labor and delivery at [facility] is [phone].  If I have COVID-19 when my baby is born how can I prevent my baby from contracting COVID-19? This is an issue that will have to be discussed on a case-by-case basis. Current recommendations suggest providing separate isolation rooms for both the mother and new infant as well as limiting visitors. However, there are practical challenges to this recommendation. The situation will assuredly change and decisions will be influenced by the desires of the mother and availability of space.  Some suggestions are the use of a curtain or physical barrier between mom and infant, hand hygiene, mom wearing a mask, or 6 feet of spacing between a mom and infant.   Can I breastfeed during the COVID-19 pandemic?   Yes, breastfeeding is encouraged.  Can I breastfeed if I have COVID-19? Yes. Covid-19 has not been found in breast milk. This means you cannot give COVID-19 to your child through breast milk. Breast feeding will also help pass antibodies to fight infection to your baby.   What precautions should I take when breastfeeding if I have COVID-19? If a mother and newborn do room-in and the mother wishes to feed at the breast, she should put on a facemask and practice hand hygiene before each feeding.  What precautions should I take when pumping if I have COVID-19? Prior to expressing  breast milk, mothers should practice hand hygiene. After each pumping session, all parts that come into contact with breast milk should be thoroughly washed and the entire pump should be appropriately disinfected per the manufacturer's instructions. This expressed breast milk should be fed to the newborn by a healthy caregiver.  What if I am pregnant and work in healthcare? Based on limited data regarding COVID-19 and pregnancy, ACOG currently does not propose creating additional restrictions on pregnant health care personnel because of COVID-19 alone. Pregnant women do not appear to be at higher risk of severe disease related to COVID-19. Pregnant health care personnel should follow CDC risk assessment and infection control guidelines for health care personnel exposed to patients with suspected or confirmed COVID-19. Adherence to recommended infection prevention and control practices is an important part of protecting all health care personnel in health care settings.    Information on COVID-19 in pregnancy is very limited; however, facilities may want to consider limiting exposure of pregnant health care personnel to patients with confirmed or suspected COVID-19 infection, especially during higher-risk procedures (eg, aerosol-generating procedures), if feasible, based on staffing availability.       Hello,  Given the current COVID-19 pandemic, our practice is making changes in how we are providing care to our patients. We are limiting in-person visits for the safety of all of our patients.   As a practice, we have met to discuss the best way to minimize visits, but still provide excellent care to our expecting mothers.  We have decided on the following visit structure for low-risk pregnancies.  Initial Pregnancy visit will be conducted as a telephone or web visit.  Between 10-14 weeks  there will be one in-person visit for an ultrasound, lab work, and genetic screening. 20 weeks in-person visit  with an anatomy ultrasound  28 weeks in-person office visit for a 1-hour glucose test and a TDAP vaccination 32 weeks in-person office visit 34 weeks telephone visit 36 weeks in-person office visit for GBS, chlamydia, and gonorrhea testing 38 weeks in-person office visit 40 weeks in-person office visit  Understandably, some patients will require more visits than what is outlined above. Additional visits will be determined on a case-by-case basis.   We will, as always, be available for emergencies or to address concerns that might arise between in-person visits. We ask that you allow us the opportunity to address any concerns over the phone or through a virtual visit first. We will be available to return your phone calls throughout the day.   If you are able to purchase a scale, a blood pressure machine, and a home fetal doppler visits could be limited further. This will help decrease your exposure risks, but these purchases are not a necessity.   Things seem to change daily and there is the possibility that this structure could change, please be patient as we adapt to a new way of caring for patients.   Thank you for trusting us with your prenatal care. Our practice values you and looks forward to providing you with excellent care.   Sincerely,   [facility], [facility]  

## 2018-04-17 NOTE — Progress Notes (Signed)
Virtual Visit via Telephone Note  I connected with [name] on 04/17/18 at  8:00 AM EDT by telephone and verified that I am speaking with the correct person using two identifiers.   I discussed the limitations, risks, security and privacy concerns of performing an evaluation and management service by telephone and the availability of in person appointments. I also discussed with the patient that there may be a patient responsible charge related to this service. The patient expressed understanding and agreed to proceed.  She was at home and I was in my office.  History of Present Illness: 04/17/2018   Chief Complaint: Missed period History of Present Illness: Ms. Devlin is a 38 y.o. [id number] Unknown based on Patient's last menstrual period was 03/08/2018. with an Estimated Date of Delivery: None noted., with the above CC.   Her periods were: irregular periods from 26 to 30 days She was using no method when she conceived.  She has Positive signs or symptoms of nausea/vomiting of pregnancy. She has Negative signs or symptoms of miscarriage or preterm labor She identifies Negative Zika risk factors for her and her partner On any different medications around the time she conceived/early pregnancy: Yes - HCTZ for cHTN, Lexapro for depression (since last delivery) History of varicella: Yes   ROS: A 12-point review of systems was performed and negative, except as stated in the above HPI.  OBGYN History: As per HPI. OB History  Gravida Para Term Preterm AB Living  0 2 1  SAB TAB Ectopic Multiple Live Births               # Outcome Date GA Lbr Len/2nd Weight Sex Delivery Anes PTL Lv  4 Current           3 AB           2 AB           1 Term             Any issues with any prior pregnancies: yes, CS 2017; hypertension developed then Any prior children are healthy, doing well, without any problems or issues: yes History of pap smears: Yes. Last pap smear 2018. Abnormal: no   History of STIs: No   Past Medical History: Past Medical History:  Diagnosis Date  . Depression   . Genital herpes   . GERD (gastroesophageal reflux disease)   . Hypertension   . Migraine     Past Surgical History: Past Surgical History:  Procedure Laterality Date  . CESAREAN SECTION N/A 12/16/2015   Procedure: CESAREAN SECTION;  Surgeon: Conard Novak, MD;  Location: [facility] ORS;  Service: Obstetrics;  Laterality: N/A;  . DILATION AND CURETTAGE OF UTERUS  01/2013    Family History:  Family History  Problem Relation Age of Onset  . Hypertension Mother   . Hypertension Father   . Hypertension Brother   . Cancer Brother 59  . Hypertension Maternal Grandmother   . Hypertension Maternal Grandfather   . Hypertension Paternal Grandmother   . Hypertension Paternal Grandfather    She denies any female cancers, bleeding or blood clotting disorders.  She denies any history of mental retardation, birth defects or genetic disorders in her or the FOB's history other than CLUBBED FOOT (paternal)  Social History:  Social History   Socioeconomic History  . Marital status: Married    Spouse name: Not on file  . Number of children: Not on file  . Years of education:  Not on file  . Highest education level: Not on file  Occupational History  . Not on file  Social Needs  . Financial resource strain: Not on file  . Food insecurity:    Worry: Not on file    Inability: Not on file  . Transportation needs:    Medical: Not on file    Non-medical: Not on file  Tobacco Use  . Smoking status: Never Smoker  . Smokeless tobacco: Never Used  Substance and Sexual Activity  . Alcohol use: No    Alcohol/week: 0.0 standard drinks    Comment: occ  . Drug use: No  . Sexual activity: Yes  Lifestyle  . Physical activity:    Days per week: Not on file    Minutes per session: Not on file  . Stress: Not on file  Relationships  . Social connections:    Talks on phone: Not on file     Gets together: Not on file    Attends religious service: Not on file    Active member of club or organization: Not on file    Attends meetings of clubs or organizations: Not on file    Relationship status: Not on file  . Intimate partner violence:    Fear of current or ex partner: Not on file    Emotionally abused: Not on file    Physically abused: Not on file    Forced sexual activity: Not on file  Other Topics Concern  . Not on file  Social History Narrative  . Not on file   Any pets in the household: yes (dogs)  Allergy: Allergies  Allergen Reactions  . Pseudoeph-Doxylamine-Dm-Apap Hives    Current Outpatient Medications:  Current Outpatient Medications:  .  escitalopram (LEXAPRO) 20 MG tablet, Take 0.5 tablets (10 mg total) by mouth daily., Disp: 30 tablet, Rfl: 5 .  guaiFENesin-codeine (CHERATUSSIN AC) 100-10 MG/5ML syrup, Take 5 mLs by mouth 2 (two) times daily as needed for cough (sedation precautions)., Disp: 120 mL, Rfl: 0 .  hydrochlorothiazide (MICROZIDE) 12.5 MG capsule, Take 1 capsule (12.5 mg total) by mouth daily., Disp: 90 capsule, Rfl: 3 .  magnesium oxide (MAG-OX) 400 MG tablet, Take 400 mg by mouth daily., Disp: , Rfl:  .  omeprazole (PRILOSEC) 20 MG capsule, Take 20 mg by mouth daily., Disp: , Rfl:  .  valACYclovir (VALTREX) 500 MG tablet, Take 1 tablet (500 mg total) by mouth 2 (two) times daily., Disp: 30 tablet, Rfl: 5 .  Ascorbic Acid (VITAMIN C) 100 MG tablet, Take 100 mg by mouth daily., Disp: , Rfl:   Review of Systems  Constitutional: Negative for chills, fever and malaise/fatigue.  HENT: Negative for congestion, sinus pain and sore throat.   Eyes: Negative for blurred vision and pain.  Respiratory: Negative for cough and wheezing.   Cardiovascular: Negative for chest pain and leg swelling.  Gastrointestinal: Negative for abdominal pain, constipation, diarrhea, heartburn, nausea and vomiting.  Genitourinary: Negative for dysuria, frequency,  hematuria and urgency.  Musculoskeletal: Negative for back pain, joint pain, myalgias and neck pain.  Skin: Negative for itching and rash.  Neurological: Negative for dizziness, tremors and weakness.  Endo/Heme/Allergies: Does not bruise/bleed easily.  Psychiatric/Behavioral: Negative for depression. The patient is not nervous/anxious and does not have insomnia.    Observations/Objective: No exam today, due to telephone eVisit due to [facility] virus restriction on elective visits and procedures.  Prior visits reviewed along with ultrasounds/labs as indicated.  Assessment and Plan: Ms. Morini  is a 38 y.o. [id number] for prenatal care.  EDC 12/13/2018.  5 5/7 today.  Plan:  1) Avoid alcoholic beverages. 2) Patient encouraged not to smoke.  3) Discontinue the use of all non-medicinal drugs and chemicals.  4) Take prenatal vitamins daily.  5) Seatbelt use advised 6) Nutrition, food safety (fish, cheese advisories, and high nitrite foods) and exercise discussed. 7) [facility]  8) Patient is asked about travel to areas at risk for the Zika virus, and counseled to avoid travel and exposure to mosquitoes or sexual partners who may have themselves been exposed to the virus. Testing is discussed, and will be ordered as appropriate.  9) [facility] advised 10) Genetic Screening, such as with 1st Trimester Screening, cell free fetal DNA, AFP testing, and Ultrasound, as well as with amniocentesis and CVS as appropriate, is discussed with patient. She plans to have genetic testing this pregnancy. 11) US 2 weeks for dating.  BP check. PAP. Labs.  12) cHTN- RISK FACTORS DURING PREGNANCY DISCUSSED.  CHANGE TO LABETALOL, BP CHECK 2 WEEKS.  STOP HCTZ.  13) PRIOR CS, DESIRES VBAC.  MORE COUNSELING TO FOLLOW  14) HSV HISTORY, VALTREX AT 36 WEEKS  15) DEPRESSION, PLANS TO STOP LEXAPRO AND SEE IF CAN DO WITHOUT.  WOULD THEN PLAN ZOLOFT 36 WEEKS TO  PREVENT PPD.  16) BMI 39, risk factors obesity in pregnancy discussed   Follow Up Instructions: 2 weeks   I discussed the assessment and treatment plan with the patient. The patient was provided an opportunity to ask questions and all were answered. The patient agreed with the plan and demonstrated an understanding of the instructions.   The patient was advised to call back or seek an in-person evaluation if the symptoms worsen or if the condition fails to improve as anticipated.  I provided 20 minutes of non-face-to-face time during this encounter.   Letitia Libraobert Paul Sada Mazzoni, MD [facility], [facility] 04/17/2018  12:08 PM

## 2018-05-01 ENCOUNTER — Encounter: Payer: Self-pay | Admitting: Certified Nurse Midwife

## 2018-05-01 ENCOUNTER — Other Ambulatory Visit: Payer: Self-pay

## 2018-05-01 ENCOUNTER — Ambulatory Visit (INDEPENDENT_AMBULATORY_CARE_PROVIDER_SITE_OTHER): Payer: No Typology Code available for payment source

## 2018-05-01 ENCOUNTER — Ambulatory Visit (INDEPENDENT_AMBULATORY_CARE_PROVIDER_SITE_OTHER): Payer: No Typology Code available for payment source | Admitting: Certified Nurse Midwife

## 2018-05-01 ENCOUNTER — Other Ambulatory Visit (HOSPITAL_COMMUNITY)
Admission: RE | Admit: 2018-05-01 | Discharge: 2018-05-01 | Disposition: A | Discharge status: Home / Self Care | Payer: No Typology Code available for payment source | Source: Ambulatory Visit | Attending: Certified Nurse Midwife | Admitting: Certified Nurse Midwife

## 2018-05-01 VITALS — BP 118/64 | Wt 199.0 lb

## 2018-05-01 DIAGNOSIS — O10911 Unspecified pre-existing hypertension complicating pregnancy, first trimester: Secondary | ICD-10-CM

## 2018-05-01 DIAGNOSIS — Z113 Encounter for screening for infections with a predominantly sexual mode of transmission: Secondary | ICD-10-CM

## 2018-05-01 DIAGNOSIS — O3481 Maternal care for other abnormalities of pelvic organs, first trimester: Secondary | ICD-10-CM | POA: Diagnosis not present

## 2018-05-01 DIAGNOSIS — N926 Irregular menstruation, unspecified: Secondary | ICD-10-CM

## 2018-05-01 DIAGNOSIS — O0990 Supervision of high risk pregnancy, unspecified, unspecified trimester: Secondary | ICD-10-CM

## 2018-05-01 DIAGNOSIS — O10919 Unspecified pre-existing hypertension complicating pregnancy, unspecified trimester: Secondary | ICD-10-CM

## 2018-05-01 DIAGNOSIS — Z3A01 Less than 8 weeks gestation of pregnancy: Secondary | ICD-10-CM

## 2018-05-01 LAB — POCT URINALYSIS DIPSTICK OB
Glucose, UA: NEGATIVE
POC,PROTEIN,UA: NEGATIVE

## 2018-05-01 MED ORDER — FOLIC ACID 1 MG PO TABS: 1.0000 mg | ORAL_TABLET | Freq: Every day | ORAL | 10 refills | Status: DC

## 2018-05-01 MED ORDER — DOXYLAMINE-PYRIDOXINE 10-10 MG PO TBEC: 2.0000 | DELAYED_RELEASE_TABLET | Freq: Every day | ORAL | 5 refills | Status: DC

## 2018-05-01 MED ORDER — LABETALOL HCL 100 MG PO TABS: 100.0000 mg | ORAL_TABLET | Freq: Two times a day (BID) | ORAL | 6 refills | Status: DC

## 2018-05-01 MED FILL — DOXYLAMINE-PYRIDOXINE 10-10: 10-10 | 30 days supply | Qty: 60 | Fill #0

## 2018-05-01 MED FILL — FOLIC ACID 1 MG TABS: 1 | 30 days supply | Qty: 30 | Fill #0

## 2018-05-01 NOTE — Patient Instructions (Signed)
Hello,  Given the current COVID-19 pandemic, our practice is making changes in how we are providing care to our patients. We are limiting in-person visits for the safety of all of our patients.   As a practice, we have met to discuss the best way to minimize visits, but still provide excellent care to our expecting mothers.  We have decided on the following visit structure for low-risk pregnancies.  Initial Pregnancy visit will be conducted as a telephone or web visit.  Between 10-14 weeks  there will be one in-person visit for an ultrasound, lab work, and genetic screening. 20 weeks in-person visit with an anatomy ultrasound  28 weeks in-person office visit for a 1-hour glucose test and a TDAP vaccination 32 weeks in-person office visit 34 weeks telephone visit 36 weeks in-person office visit for GBS, chlamydia, and gonorrhea testing 38 weeks in-person office visit 40 weeks in-person office visit  Understandably, some patients will require more visits than what is outlined above. Additional visits will be determined on a case-by-case basis.   We will, as always, be available for emergencies or to address concerns that might arise between in-person visits. We ask that you allow [place] the opportunity to address any concerns over the phone or through a virtual visit first. We will be available to return your phone calls throughout the day.   If you are able to purchase a scale, a blood pressure machine, and a home fetal doppler visits could be limited further. This will help decrease your exposure risks, but these purchases are not a necessity.   Things seem to change daily and there is the possibility that this structure could change, please be patient as we adapt to a new way of caring for patients.   Thank you for trusting [place] with your prenatal care. Our practice values you and looks forward to providing you with excellent care.   Sincerely,   [facility], [place]     COVID-19 and Your Pregnancy FAQ  How can I prevent infection with COVID-19 during my pregnancy? Social distancing is key. Please limit any interactions in public. Try and work from home if possible. Frequently wash your hands after touching possibly contaminated surfaces. Avoid touching your face.  Minimize trips to the store. Consider online ordering when possible.   Should I wear a mask? YES. It is recommended by the CDC that all people wear a cloth mask or facial covering in public. This will help reduce transmission as well as your risk or acquiring COVID-19. New studies are showing that even asymptomatic individuals can spread the virus from talking.   What are the symptoms of COVID-19? Fever (greater than 100.4 F), dry cough, shortness of breath.  Am I more at risk for COVID-19 since I am pregnant? There is not currently data showing that pregnant women are more adversely impacted by COVID-19 than the general population. However, we know that pregnant women tend to have worse respiratory complications from similar diseases such as the flu and SARS and for this reason should be considered an at-risk population.  What do I do if I am experiencing the symptoms of COVID-19? Testing is being limited because of test availability. If you are experiencing symptoms you should quarantine yourself, and the members of your family, for at least 2 weeks at home.   Please visit this website for more information: [email or web address]  When should I go to the Emergency Room? Please go to the emergency room if you  are experiencing ANY of these symptoms*:  1.    Difficulty breathing or shortness of breath 2.    Persistent pain or pressure in the chest 3.    Confusion or difficulty being aroused (or awakened) 4.    Bluish lips or face  *This list is not all inclusive. Please consult our office for any other symptoms that are severe or  concerning.  What do I do if I am having difficulty breathing? You should go to the Emergency Room for evaluation. At this time they have a tent set up for evaluating patients with COVID-19 symptoms.   How will my prenatal care be different because of the COVID-19 pandemic? It has been recommended to reduce the frequency of face-to-face visits and use resources such as telephone and virtual visits when possible. Using a scale, blood pressure machine and fetal doppler at home can further help reduce face-to-face visits. You will be provided with additional information on this topic.  We ask that you come to your visits alone to minimize potential exposures to  COVID-19.  How can I receive childbirth education? At this time in-person classes have been cancelled. You can register for online childbirth education, breastfeeding, and newborn care classes.  Please visit:  [email or web address] for more information  How will my hospital birth experience be different? The hospital is currently limiting visitors. This means that while you are in labor you can only have one person at the hospital with you. Additional family members will not be allowed to wait in the building or outside your room. Your one support person can be the father of the baby, a relative, a doula, or a friend. Once one support person is designated that person will wear a band. This band cannot be shared with multiple people.  Nitrous Gas is not being offered for pain relief since the tubing and filter for the machine can not be sanitized in a way to guarantee prevention of transmission of COVID-19.  Nasal cannula use of oxygen for fetal indications has also been discontinued.  Currently a clear plastic sheet is being hung between mom and the delivering provider during pushing and delivery to help prevent transmission of COVID-19.      How long will I stay in the hospital for after giving birth? It is also recommended that  discharge home be expedited during the COVID-19 outbreak. This means staying for 1 day after a vaginal delivery and 2 days after a cesarean section. Patients who need to stay longer for medical reasons are allowed to do so, but the goal will be for expedited discharge home.   What if I have COVID-19 and I am in labor? We ask that you wear a mask while on labor and delivery. We will try and accommodate you being placed in a room that is capable of filtering the air. Please call ahead if you are in labor and on your way to the hospital. The phone number for labor and delivery at [facility] is [phone].  If I have COVID-19 when my baby is born how can I prevent my baby from contracting COVID-19? This is an issue that will have to be discussed on a case-by-case basis. Current recommendations suggest providing separate isolation rooms for both the mother and new infant as well as limiting visitors. However, there are practical challenges to this recommendation. The situation will assuredly change and decisions will be influenced by the desires of the mother and availability of space.    Some suggestions are the use of a curtain or physical barrier between mom and infant, hand hygiene, mom wearing a mask, or 6 feet of spacing between a mom and infant.   Can I breastfeed during the COVID-19 pandemic?   Yes, breastfeeding is encouraged.  Can I breastfeed if I have COVID-19? Yes. Covid-19 has not been found in breast milk. This means you cannot give COVID-19 to your child through breast milk. Breast feeding will also help pass antibodies to fight infection to your baby.   What precautions should I take when breastfeeding if I have COVID-19? If a mother and newborn do room-in and the mother wishes to feed at the breast, she should put on a facemask and practice hand hygiene before each feeding.  What precautions should I take when pumping if I have COVID-19? Prior to expressing  breast milk, mothers should practice hand hygiene. After each pumping session, all parts that come into contact with breast milk should be thoroughly washed and the entire pump should be appropriately disinfected per the manufacturer's instructions. This expressed breast milk should be fed to the newborn by a healthy caregiver.  What if I am pregnant and work in healthcare? Based on limited data regarding COVID-19 and pregnancy, ACOG currently does not propose creating additional restrictions on pregnant health care personnel because of COVID-19 alone. Pregnant women do not appear to be at higher risk of severe disease related to COVID-19. Pregnant health care personnel should follow CDC risk assessment and infection control guidelines for health care personnel exposed to patients with suspected or confirmed COVID-19. Adherence to recommended infection prevention and control practices is an important part of protecting all health care personnel in health care settings.    Information on COVID-19 in pregnancy is very limited; however, facilities may want to consider limiting exposure of pregnant health care personnel to patients with confirmed or suspected COVID-19 infection, especially during higher-risk procedures (eg, aerosol-generating procedures), if feasible, based on staffing availability.

## 2018-05-01 NOTE — Progress Notes (Signed)
New Obstetric Patient H&P    Chief Complaint: "Desires prenatal care"   History of Present Illness: Patient is a 38 y.o. 134P1021 White female, LMP 03/08/2018 presents for a dating scan/ NOB physical/ NOB labs.  Based on her  LMP, her EDD is Estimated Date of Delivery: 12/13/18 and her EGA is 1836w5d. She had a dating ultrasound today. It revealed a SIUP with a CRL c/w 7wk3d, confirming her dates.  FCA was 144 BPM.  Her past medical history is remarkable for chronic hypertension, AMA, obesity with BMI>40, depression, and HSV. She was taking HCTZ for her CHTN prior to pregnancy and was switched to labetalol 200 mgm BID after talking with Dr Tiburcio PeaHarris. She has only been taking 200 mgm daily because her scalp tingles if she takes the second dose. Blood pressure today 118/64. Her prior pregnancies are notable for chronic HTN, and oligohydramnios. She had an induction of labor for the oligohydramnios at 38 weeks and delivered via Cesarean section for arrest of dilation.    Review of Systems:10 point review of systems negative unless otherwise noted in HPI. She does report having nausea and vomiting this weekend x 24 hours after eating some left over potato salad. She has been having continued nausea with the pregnancy and has been taking vitamin B6. She is requesting an antiemetic. Took Diclegis in the previous pregnancy and that was effective.  Past Medical History:  Past Medical History:  Diagnosis Date  . Depression   . Genital herpes   . GERD (gastroesophageal reflux disease)   . Hypertension   . Migraine   . Obesity affecting pregnancy     Past Surgical History:  Past Surgical History:  Procedure Laterality Date  . CESAREAN SECTION N/A 12/16/2015   Procedure: CESAREAN SECTION;  Surgeon: Conard NovakStephen D Jackson, MD;  Location: [facility] ORS;  Service: Obstetrics;  Laterality: N/A;  . DILATION AND CURETTAGE OF UTERUS  01/2013    Gynecologic History: Patient's last menstrual period was  03/08/2018.  Obstetric History: [id number]  Family History:  Family History  Problem Relation Age of Onset  . Hypertension Mother   . Hypertension Father   . Hypertension Brother   . Cancer Brother [id number]  . Hypertension Maternal Grandmother   . Hypertension Maternal Grandfather   . Hypertension Paternal Grandmother   . Hypertension Paternal Grandfather     Social History:  Social History   Socioeconomic History  . Marital status: Married    Spouse name: Not on file  . Number of children: 1  . Years of education: Not on file  . Highest education level: Not on file  Occupational History  . Not on file  Social Needs  . Financial resource strain: Not on file  . Food insecurity:    Worry: Not on file    Inability: Not on file  . Transportation needs:    Medical: Not on file    Non-medical: Not on file  Tobacco Use  . Smoking status: Never Smoker  . Smokeless tobacco: Never Used  Substance and Sexual Activity  . Alcohol use: No    Alcohol/week: 0.0 standard drinks    Comment: occ  . Drug use: No  . Sexual activity: Yes  Lifestyle  . Physical activity:    Days per week: Not on file    Minutes per session: Not on file  . Stress: Not on file  Relationships  . Social connections:    Talks on phone: Not on file  Gets together: Not on file    Attends religious service: Not on file    Active member of club or organization: Not on file    Attends meetings of clubs or organizations: Not on file    Relationship status: Not on file  . Intimate partner violence:    Fear of current or ex partner: Not on file    Emotionally abused: Not on file    Physically abused: Not on file    Forced sexual activity: Not on file  Other Topics Concern  . Not on file  Social History Narrative  . Not on file    Allergies:  Allergies  Allergen Reactions  . Pseudoeph-Doxylamine-Dm-Apap Hives    Medications: Prior to Admission medications   Medication Sig Start Date End Date Taking?  Authorizing Provider  labetalol (NORMODYNE) 200 MG tablet Take 1 tablet (200 mg total) by mouth 2 (two) times daily. 04/17/18  Yes Nadara Mustard, MD  omeprazole (PRILOSEC) 20 MG capsule Take 20 mg by mouth daily.   Yes [provider]  valACYclovir (VALTREX) 500 MG tablet Take 1 tablet (500 mg total) by mouth 2 (two) times daily. 03/06/18  Yes Copland, Karleen Hampshire, MD                  Physical Exam Vitals: BP 118/64   Wt 199 lb (90.3 kg)   LMP 03/08/2018   BMI 40.19 kg/m .  General: WF in NAD HEENT: normocephalic, anicteric, PEARL Thyroid/ Neck: no enlargement, no palpable nodules Pulmonary: No increased work of breathing, CTAB Breasts: soft, no masses, no inflammation of skin or nippes Cardiovascular: RRR, without murmur Abdomen: soft, non-tender, non-distended.  Umbilicus without lesions.  No hepatomegaly or masses palpable. No evidence of hernia  Genitourinary:  External: Normal external female genitalia.  Normal urethral meatus, normal Bartholin's and Skene's glands.    Vagina: Normal vaginal mucosa, no evidence of prolapse.    Cervix: no bleeding  Uterus: deferred due to today's ultrasound  Adnexa: deferred due to today's ultrasound  Rectal: deferred Extremities: no edema, erythema, or tenderness Neurologic: Grossly intact Psychiatric: mood appropriate, affect full   Assessment: 38 y.o. [id number] at [redacted]w[redacted]d by LMP, confirmed with 7 week ultrasound Nausea of pregnancy Obesity with BMI 40.19 kg/m2 CHTN-normotensive on 200 mgm labetalol daily Prior Cesarean section-interested in TOLAC HSV  Plan: 1) RX for Diclegis sent to pharmacy.  2) Continue prenatal vitamins and add folic acid 1 mgm po daily  3) Decrease 100 mgm labetalol BID  4) Nutrition, food safety (fish, cheese advisories, and high nitrite foods) and exercise discussed.  6) Discussed TOLAC vs repeat Cesarean section. Advised of risk of uterine rupture with spontaneous labor vs with IOL. Advised best chance of  delivering vaginally is with spontaneous labor. 7) AMA: Genetic Screening desired. Will get MaterniT21 with 1 hour GTT in 4 weeks and ROB /blood pressure check in 2 weeks. Patient has a blood pressure cuff. To take blood pressure daily and keep a log. Discussed parameters to report. 8) NOB labs including PC ratio and CMP today.  Farrel Conners, CNM

## 2018-05-02 LAB — PROTEIN / CREATININE RATIO, URINE
Creatinine, Urine: 50.4 mg/dL
Protein, Ur: 4.5 mg/dL
Protein/Creat Ratio: 89 mg/g creat (ref 0–200)

## 2018-05-02 LAB — COMPREHENSIVE METABOLIC PANEL
ALT: 11 IU/L (ref 0–32)
AST: 9 IU/L (ref 0–40)
Albumin/Globulin Ratio: 2.4 — ABNORMAL HIGH (ref 1.2–2.2)
Albumin: 4.3 g/dL (ref 3.8–4.8)
Alkaline Phosphatase: 37 IU/L — ABNORMAL LOW (ref 39–117)
BUN/Creatinine Ratio: 12 (ref 9–23)
BUN: 8 mg/dL (ref 6–20)
Bilirubin Total: 0.3 mg/dL (ref 0.0–1.2)
CO2: 22 mmol/L (ref 20–29)
Calcium: 9.5 mg/dL (ref 8.7–10.2)
Chloride: 105 mmol/L (ref 96–106)
Creatinine, Ser: 0.69 mg/dL (ref 0.57–1.00)
GFR calc Af Amer: 129 mL/min/{1.73_m2} (ref >59)
GFR calc non Af Amer: 112 mL/min/{1.73_m2} (ref >59)
Globulin, Total: 1.8 g/dL (ref 1.5–4.5)
Glucose: 86 mg/dL (ref 65–99)
Potassium: 5 mmol/L (ref 3.5–5.2)
Sodium: 142 mmol/L (ref 134–144)
Total Protein: 6.1 g/dL (ref 6.0–8.5)

## 2018-05-02 LAB — URINE DRUG PANEL 7
Amphetamines, Urine: NEGATIVE ng/mL
Barbiturate Quant, Ur: NEGATIVE ng/mL
Benzodiazepine Quant, Ur: NEGATIVE ng/mL
Cannabinoid Quant, Ur: NEGATIVE ng/mL
Cocaine (Metab.): NEGATIVE ng/mL
Opiate Quant, Ur: NEGATIVE ng/mL
PCP Quant, Ur: NEGATIVE ng/mL

## 2018-05-02 LAB — RPR+RH+ABO+RUB AB+AB SCR+CB...
Antibody Screen: NEGATIVE
HIV Screen 4th Generation wRfx: NONREACTIVE
Hematocrit: 43.9 % (ref 34.0–46.6)
Hemoglobin: 14.1 g/dL (ref 11.1–15.9)
Hepatitis B Surface Ag: NEGATIVE
MCH: 29.7 pg (ref 26.6–33.0)
MCHC: 32.1 g/dL (ref 31.5–35.7)
MCV: 92 fL (ref 79–97)
Platelets: 272 10*3/uL (ref 150–450)
RBC: 4.75 x10E6/uL (ref 3.77–5.28)
RDW: 12.6 % (ref 11.7–15.4)
RPR Ser Ql: NONREACTIVE
Rh Factor: POSITIVE
Rubella Antibodies, IGG: 1.92 index (ref >0.99)
Varicella zoster IgG: 1475 index (ref >165)
WBC: 6.8 10*3/uL (ref 3.4–10.8)

## 2018-05-03 LAB — CERVICOVAGINAL ANCILLARY ONLY
Chlamydia: NEGATIVE
Neisseria Gonorrhea: NEGATIVE

## 2018-05-15 ENCOUNTER — Other Ambulatory Visit: Payer: Self-pay

## 2018-05-15 ENCOUNTER — Ambulatory Visit (INDEPENDENT_AMBULATORY_CARE_PROVIDER_SITE_OTHER): Payer: No Typology Code available for payment source | Admitting: Obstetrics & Gynecology

## 2018-05-15 ENCOUNTER — Encounter: Payer: Self-pay | Admitting: Obstetrics & Gynecology

## 2018-05-15 VITALS — BP 120/80 | Wt 208.0 lb

## 2018-05-15 DIAGNOSIS — O10911 Unspecified pre-existing hypertension complicating pregnancy, first trimester: Secondary | ICD-10-CM | POA: Diagnosis not present

## 2018-05-15 DIAGNOSIS — O10919 Unspecified pre-existing hypertension complicating pregnancy, unspecified trimester: Secondary | ICD-10-CM | POA: Insufficient documentation

## 2018-05-15 NOTE — Progress Notes (Signed)
Obstetrics & Gynecology Office Visit   Chief Complaint  Patient presents with  . Blood pressure check  . Nausea    History of Present Illness: 38 y.o. [id number] being seen for follow up blood pressure check today.  The patient is currently pregnantThe established diagnosis for the patient is chronic hypertension.  She is currently on labetalol 100 mg.  She reports no current symptoms attributable to her blood pressure and nausea.  Medication list reviewed no medications contraindicated for use in patient with current hypertension were noted.  Past Medical History:  Past Medical History:  Diagnosis Date  . Depression   . Genital herpes   . GERD (gastroesophageal reflux disease)   . Hypertension   . Migraine   . Obesity affecting pregnancy     Past Surgical History:  Past Surgical History:  Procedure Laterality Date  . CESAREAN SECTION N/A 12/16/2015   Procedure: CESAREAN SECTION;  Surgeon: Conard NovakStephen D Jackson, MD;  Location: [facility] ORS;  Service: Obstetrics;  Laterality: N/A;  . DILATION AND CURETTAGE OF UTERUS  01/2013    Gynecologic History: Patient's last menstrual period was 03/08/2018.  Obstetric History: [id number]  Family History:  Family History  Problem Relation Age of Onset  . Hypertension Mother   . Hypertension Father   . Hypertension Brother   . Cancer Brother [phone]  . Hypertension Maternal Grandmother   . Hypertension Maternal Grandfather   . Hypertension Paternal Grandmother   . Hypertension Paternal Grandfather     Social History:  Social History   Socioeconomic History  . Marital status: Married    Spouse name: Not on file  . Number of children: 1  . Years of education: Not on file  . Highest education level: Not on file  Occupational History  . Not on file  Social Needs  . Financial resource strain: Not on file  . Food insecurity:    Worry: Not on file    Inability: Not on file  . Transportation needs:    Medical: Not on file   Non-medical: Not on file  Tobacco Use  . Smoking status: Never Smoker  . Smokeless tobacco: Never Used  Substance and Sexual Activity  . Alcohol use: No    Alcohol/week: 0.0 standard drinks    Comment: occ  . Drug use: No  . Sexual activity: Yes  Lifestyle  . Physical activity:    Days per week: Not on file    Minutes per session: Not on file  . Stress: Not on file  Relationships  . Social connections:    Talks on phone: Not on file    Gets together: Not on file    Attends religious service: Not on file    Active member of club or organization: Not on file    Attends meetings of clubs or organizations: Not on file    Relationship status: Not on file  . Intimate partner violence:    Fear of current or ex partner: Not on file    Emotionally abused: Not on file    Physically abused: Not on file    Forced sexual activity: Not on file  Other Topics Concern  . Not on file  Social History Narrative  . Not on file    Allergies:  Allergies  Allergen Reactions  . Pseudoeph-Doxylamine-Dm-Apap Hives    Medications: Prior to Admission medications   Medication Sig Start Date End Date Taking? Authorizing Provider  Doxylamine-Pyridoxine (DICLEGIS) 10-10 MG TBEC Take 2 tablets by  mouth at bedtime. If symptoms persist, add one tablet in the morning and one in the afternoon 05/01/18   Farrel Conners, CNM  folic acid (FOLVITE) 1 MG tablet Take 1 tablet (1 mg total) by mouth daily. 05/01/18   Farrel Conners, CNM  labetalol (NORMODYNE) 100 MG tablet Take 1 tablet (100 mg total) by mouth 2 (two) times daily. 05/01/18   Farrel Conners, CNM  omeprazole (PRILOSEC) 20 MG capsule Take 20 mg by mouth daily.    [provider]  valACYclovir (VALTREX) 500 MG tablet Take 1 tablet (500 mg total) by mouth 2 (two) times daily. 03/06/18   Copland, Karleen Hampshire, MD    Review of Systems  All other systems reviewed and are negative.   Physical Exam Blood pressure 120/80, weight 208 lb  (94.3 kg), last menstrual period 03/08/2018, unknown if currently breastfeeding.  Patient's last menstrual period was 03/08/2018.  General: NAD HEENT: normocephalic, anicteric Pulmonary: No increased work of breathing Cardiovascular: RRR, distal pulses 2+ Extremities: 1+edema, no erythema, no tenderness Neurologic: Grossly intact Psychiatric: mood appropriate, affect full  Assessment: 38 y.o. [id number] presenting for blood pressure evaluation today. 9 weeks pregnancy.  Plan: Problem List Items Addressed This Visit      Cardiovascular and Mediastinum   Chronic hypertension affecting pregnancy - Primary      1) Blood pressure - blood pressure at today's visit is normotensive.  As a result will continue Labetalol 100 mg BID. - additional blood work was not obtained  - continued monitoring for s/sx HTN and preeclampsia discussed as an overview for this pregnancy both short term and long term  2) Lab work for [employer] and genetics next week  A total of 15 minutes were spent face-to-face with the patient during this encounter and over half of that time dealt with counseling and coordination of care.  Annamarie Major, MD, [facility], [facility] 05/15/2018  3:43 PM

## 2018-05-22 ENCOUNTER — Other Ambulatory Visit: Payer: Self-pay

## 2018-05-22 ENCOUNTER — Other Ambulatory Visit: Payer: Self-pay | Admitting: Obstetrics and Gynecology

## 2018-05-22 ENCOUNTER — Other Ambulatory Visit: Payer: No Typology Code available for payment source

## 2018-05-22 DIAGNOSIS — Z6837 Body mass index (BMI) 37.0-37.9, adult: Secondary | ICD-10-CM

## 2018-05-22 DIAGNOSIS — O99211 Obesity complicating pregnancy, first trimester: Secondary | ICD-10-CM

## 2018-05-22 DIAGNOSIS — O99210 Obesity complicating pregnancy, unspecified trimester: Secondary | ICD-10-CM | POA: Insufficient documentation

## 2018-05-22 DIAGNOSIS — Z1379 Encounter for other screening for genetic and chromosomal anomalies: Secondary | ICD-10-CM

## 2018-05-22 DIAGNOSIS — O0990 Supervision of high risk pregnancy, unspecified, unspecified trimester: Secondary | ICD-10-CM

## 2018-05-22 DIAGNOSIS — Z6841 Body Mass Index (BMI) 40.0 and over, adult: Secondary | ICD-10-CM | POA: Insufficient documentation

## 2018-05-23 LAB — GLUCOSE, 1 HOUR GESTATIONAL: Gestational Diabetes Screen: 100 mg/dL (ref 65–139)

## 2018-05-27 LAB — MATERNIT 21 PLUS CORE, BLOOD
Fetal Fraction: 8
Result (T21): NEGATIVE
Trisomy 13 (Patau syndrome): NEGATIVE
Trisomy 18 (Edwards syndrome): NEGATIVE
Trisomy 21 (Down syndrome): NEGATIVE

## 2018-05-30 MED FILL — DOXYLAMINE-PYRIDOXINE 10-10: 10-10 | 30 days supply | Qty: 60 | Fill #1

## 2018-05-30 MED FILL — FOLIC ACID 1 MG TABS: 1 | 30 days supply | Qty: 30 | Fill #1

## 2018-06-05 ENCOUNTER — Encounter: Payer: Self-pay | Admitting: Obstetrics and Gynecology

## 2018-06-05 ENCOUNTER — Ambulatory Visit (INDEPENDENT_AMBULATORY_CARE_PROVIDER_SITE_OTHER): Payer: No Typology Code available for payment source | Admitting: Obstetrics and Gynecology

## 2018-06-05 ENCOUNTER — Other Ambulatory Visit: Payer: Self-pay

## 2018-06-05 DIAGNOSIS — O10919 Unspecified pre-existing hypertension complicating pregnancy, unspecified trimester: Secondary | ICD-10-CM

## 2018-06-05 DIAGNOSIS — O99211 Obesity complicating pregnancy, first trimester: Secondary | ICD-10-CM

## 2018-06-05 DIAGNOSIS — Z3A12 12 weeks gestation of pregnancy: Secondary | ICD-10-CM

## 2018-06-05 DIAGNOSIS — O10911 Unspecified pre-existing hypertension complicating pregnancy, first trimester: Secondary | ICD-10-CM

## 2018-06-05 DIAGNOSIS — O0990 Supervision of high risk pregnancy, unspecified, unspecified trimester: Secondary | ICD-10-CM

## 2018-06-05 DIAGNOSIS — O34219 Maternal care for unspecified type scar from previous cesarean delivery: Secondary | ICD-10-CM

## 2018-06-05 DIAGNOSIS — Z6837 Body mass index (BMI) 37.0-37.9, adult: Secondary | ICD-10-CM

## 2018-06-05 NOTE — Progress Notes (Signed)
    Routine Prenatal Care Visit- Virtual Visit  Subjective   Virtual Visit via Telephone Note  I connected with Lacey Rodgers on 06/05/18 at  3:50 PM EDT by telephone and verified that I am speaking with the correct person using two identifiers.   I discussed the limitations, risks, security and privacy concerns of performing an evaluation and management service by telephone and the availability of in person appointments. I also discussed with the patient that there may be a patient responsible charge related to this service. The patient expressed understanding and agreed to proceed.  The patient was at [facility], [place] I spoke with the patient from my  office The names of people involved in this encounter were: Lacey Rodgers and Thomasene Mohair, MD.   Lacey Rodgers is a 38 y.o. [phone] at [redacted]w[redacted]d being seen today for ongoing prenatal care.  She is currently monitored for the following issues for this high-risk pregnancy and has GENITAL HERPES; COMMON MIGRAINE; Essential hypertension; GERD; Migraine aura, persistent; Moderate recurrent major depression (HCC); Tendinitis of left rotator cuff; Skin rash; Infected pierced ear; Cough; History of cesarean delivery, currently pregnant; Chronic hypertension affecting pregnancy; Supervision of high risk pregnancy, antepartum; Obesity affecting pregnancy; and BMI 37.0-37.9, adult on their problem list.  ----------------------------------------------------------------------------------- Patient reports no complaints.  Nausea is getting better.    . Vag. Bleeding: None.   . Denies leaking of fluid.  ----------------------------------------------------------------------------------- The following portions of the patient's history were reviewed and updated as appropriate: allergies, current medications, past family history, past medical history, past social history, past surgical history and problem list. Problem list updated.  Objective  Last menstrual  period 03/08/2018, unknown if currently breastfeeding. Pregravid weight 192 lb (87.1 kg) Total Weight Gain 16 lb (7.258 kg) Urinalysis:      Fetal Status:           Physical Exam could not be performed. Because of the COVID-19 outbreak this visit was performed over the phone and not in person.   Assessment   38 y.o. [id number] at [redacted]w[redacted]d by  12/13/2018, by Last Menstrual Period presenting for routine prenatal visit  Plan   pregnancy4 Problems (from 03/08/18 to present)    Problem Noted Resolved   Supervision of high risk pregnancy, antepartum 05/22/2018 by Conard Novak, MD No   Obesity affecting pregnancy 05/22/2018 by Conard Novak, MD No   BMI 37.0-37.9, adult 05/22/2018 by Conard Novak, MD No   Chronic hypertension affecting pregnancy 05/15/2018 by Nadara Mustard, MD No   History of cesarean delivery, currently pregnant 04/17/2018 by Nadara Mustard, MD No     Gestational age appropriate obstetric precautions including but not limited to vaginal bleeding, contractions, leaking of fluid and fetal movement were reviewed in detail with the patient.     Follow Up Instructions: Start bASA Reviewed labs   I discussed the assessment and treatment plan with the patient. The patient was provided an opportunity to ask questions and all were answered. The patient agreed with the plan and demonstrated an understanding of the instructions.   The patient was advised to call back or seek an in-person evaluation if the symptoms worsen or if the condition fails to improve as anticipated.  I provided 18 minutes of non-face-to-face time during this encounter.  Return in about 4 weeks (around 07/03/2018) for Routine Prenatal Appointment.  Thomasene Mohair, MD  [facility], [facility] 06/05/2018 4:52 PM

## 2018-07-04 ENCOUNTER — Other Ambulatory Visit: Payer: Self-pay

## 2018-07-04 ENCOUNTER — Encounter: Payer: Self-pay | Admitting: Maternal Newborn

## 2018-07-04 ENCOUNTER — Ambulatory Visit (INDEPENDENT_AMBULATORY_CARE_PROVIDER_SITE_OTHER): Payer: No Typology Code available for payment source | Admitting: Maternal Newborn

## 2018-07-04 VITALS — BP 130/70 | Wt 214.0 lb

## 2018-07-04 DIAGNOSIS — O34219 Maternal care for unspecified type scar from previous cesarean delivery: Secondary | ICD-10-CM

## 2018-07-04 DIAGNOSIS — Z3689 Encounter for other specified antenatal screening: Secondary | ICD-10-CM

## 2018-07-04 DIAGNOSIS — O10912 Unspecified pre-existing hypertension complicating pregnancy, second trimester: Secondary | ICD-10-CM

## 2018-07-04 DIAGNOSIS — O0990 Supervision of high risk pregnancy, unspecified, unspecified trimester: Secondary | ICD-10-CM

## 2018-07-04 DIAGNOSIS — Z3A16 16 weeks gestation of pregnancy: Secondary | ICD-10-CM

## 2018-07-04 NOTE — Patient Instructions (Signed)

## 2018-07-04 NOTE — Progress Notes (Signed)
ROB C/o "feels like ribs are cracking open" x 3 weeks

## 2018-07-04 NOTE — Progress Notes (Signed)
    Routine Prenatal Care Visit  Subjective  Lacey Rodgers is a 38 y.o. G4P1021 at [redacted]w[redacted]d being seen today for ongoing prenatal care.  She is currently monitored for the following issues for this high-risk pregnancy and has GENITAL HERPES; COMMON MIGRAINE; Essential hypertension; GERD; Migraine aura, persistent; Moderate recurrent major depression ([place]); Tendinitis of left rotator cuff; Skin rash; Infected pierced ear; Cough; History of cesarean delivery, currently pregnant; Chronic hypertension affecting pregnancy; Supervision of high risk pregnancy, antepartum; Obesity affecting pregnancy; and BMI 37.0-37.9, adult on their problem list.  ----------------------------------------------------------------------------------- Patient reports rib pain and migraine headaches Vag. Bleeding: None.  No leaking of fluid.  ----------------------------------------------------------------------------------- The following portions of the patient's history were reviewed and updated as appropriate: allergies, current medications, past family history, past medical history, past social history, past surgical history and problem list. Problem list updated.   Objective  Blood pressure 130/70, weight 214 lb (97.1 kg), last menstrual period 03/08/2018, unknown if currently breastfeeding. Pregravid weight 192 lb (87.1 kg) Total Weight Gain 22 lb (9.979 kg)  Fetal Status: Fetal Heart Rate (bpm): 154         General:  Alert, oriented and cooperative. Patient is in no acute distress.  Skin: Skin is warm and dry. No rash noted.   Cardiovascular: Normal heart rate noted  Respiratory: Normal respiratory effort, no problems with respiration noted  Abdomen: Soft, gravid, appropriate for gestational age. Pain/Pressure: Absent     Pelvic:  Cervical exam deferred        Extremities: Normal range of motion.     Mental Status: Normal mood and affect. Normal behavior. Normal judgment and thought content.     Assessment   38 y.o. [id number] at [redacted]w[redacted]d, EDD 12/13/2018 by Last Menstrual Period presenting for a routine prenatal visit.  Plan   pregnancy4 Problems (from 03/08/18 to present)    Problem Noted Resolved   Supervision of high risk pregnancy, antepartum 05/22/2018 by Will Bonnet, MD No   Obesity affecting pregnancy 05/22/2018 by Will Bonnet, MD No   BMI 37.0-37.9, adult 05/22/2018 by Will Bonnet, MD No   Chronic hypertension affecting pregnancy 05/15/2018 by Gae Dry, MD No   History of cesarean delivery, currently pregnant 04/17/2018 by Gae Dry, MD No      Discussed medications for migraine headaches in pregnancy, does not want to try any at this time.  Please refer to After Visit Summary for other counseling recommendations.   Return in about 4 weeks (around 08/01/2018) for ROB and anatomy scan.  Avel Sensor, CNM 07/04/2018  3:32 PM

## 2018-08-01 ENCOUNTER — Ambulatory Visit (INDEPENDENT_AMBULATORY_CARE_PROVIDER_SITE_OTHER): Payer: No Typology Code available for payment source | Admitting: Obstetrics & Gynecology

## 2018-08-01 ENCOUNTER — Encounter: Payer: Self-pay | Admitting: Obstetrics & Gynecology

## 2018-08-01 ENCOUNTER — Other Ambulatory Visit: Payer: Self-pay

## 2018-08-01 ENCOUNTER — Ambulatory Visit (INDEPENDENT_AMBULATORY_CARE_PROVIDER_SITE_OTHER): Payer: No Typology Code available for payment source

## 2018-08-01 VITALS — BP 132/80 | Wt 215.0 lb

## 2018-08-01 DIAGNOSIS — Z6837 Body mass index (BMI) 37.0-37.9, adult: Secondary | ICD-10-CM

## 2018-08-01 DIAGNOSIS — O34212 Maternal care for vertical scar from previous cesarean delivery: Secondary | ICD-10-CM

## 2018-08-01 DIAGNOSIS — O0992 Supervision of high risk pregnancy, unspecified, second trimester: Secondary | ICD-10-CM

## 2018-08-01 DIAGNOSIS — O34219 Maternal care for unspecified type scar from previous cesarean delivery: Secondary | ICD-10-CM

## 2018-08-01 DIAGNOSIS — Z363 Encounter for antenatal screening for malformations: Secondary | ICD-10-CM | POA: Diagnosis not present

## 2018-08-01 DIAGNOSIS — O0990 Supervision of high risk pregnancy, unspecified, unspecified trimester: Secondary | ICD-10-CM

## 2018-08-01 DIAGNOSIS — O10912 Unspecified pre-existing hypertension complicating pregnancy, second trimester: Secondary | ICD-10-CM

## 2018-08-01 DIAGNOSIS — O10919 Unspecified pre-existing hypertension complicating pregnancy, unspecified trimester: Secondary | ICD-10-CM

## 2018-08-01 DIAGNOSIS — Z3A20 20 weeks gestation of pregnancy: Secondary | ICD-10-CM

## 2018-08-01 DIAGNOSIS — Z3689 Encounter for other specified antenatal screening: Secondary | ICD-10-CM

## 2018-08-01 LAB — POCT URINALYSIS DIPSTICK OB
Glucose, UA: NEGATIVE
POC,PROTEIN,UA: NEGATIVE

## 2018-08-01 MED FILL — LABETALOL HCL 200 MG TABLET: 200 | 30 days supply | Qty: 60 | Fill #1

## 2018-08-01 NOTE — Progress Notes (Signed)
HPI: Pt is doing well at 20 weeks pregnancy.  BP under control w Labetalol.  Denies ha, blurry vision, CP, SOB, epigastric pain.  Ultrasound demonstrates : normal fetal antaomy  PMHx: She  has a past medical history of Depression, Genital herpes, GERD (gastroesophageal reflux disease), Hypertension, Migraine, and Obesity affecting pregnancy. Also,  has a past surgical history that includes Dilation and curettage of uterus (01/2013) and Cesarean section (N/A, 12/16/2015)., family history includes Cancer (age of onset: 35) in her brother; Hypertension in her brother, father, maternal grandfather, maternal grandmother, mother, paternal grandfather, and paternal grandmother.,  reports that she has never smoked. She has never used smokeless tobacco. She reports that she does not drink alcohol or use drugs.  She has a current medication list which includes the following prescription(s): doxylamine-pyridoxine, folic acid, labetalol, omeprazole, and valacyclovir. Also, is allergic to pseudoeph-doxylamine-dm-apap.  Review of Systems  All other systems reviewed and are negative.   Objective: BP 132/80    Wt 215 lb (97.5 kg)    LMP 03/08/2018    BMI 43.42 kg/m   Physical examination Constitutional NAD, Conversant  Skin No rashes, lesions or ulceration.   Extremities: Moves all appropriately.  Normal ROM for age. No lymphadenopathy.  Neuro: Grossly intact  Psych: Oriented to PPT.  Normal mood. Normal affect.   US Ob Comp + 14 Wk  Result Date: 08/01/2018 Patient Name: Lacey Rodgers DOB: 29-Jul-1980 MRN: [id number] ULTRASOUND REPORT Location: [facility] Date of Service: 08/01/2018 Indications:Anatomy Ultrasound Findings: Lacey Rodgers intrauterine pregnancy is visualized with FHR at 148 BPM. Biometrics give an (U/S) Gestational age of [redacted]w[redacted]d and an (U/S) EDD of 12/12/2018; this correlates with the clinically established Estimated Date of Delivery: 12/13/18 Fetal presentation is Cephalic. EFW: 374 g ( 13  oz ). Placenta: anterior. Grade: 0 AFI: subjectively normal. Anatomic survey is complete and normal; Gender - female.  Impression: 1. [redacted]w[redacted]d Viable Singleton Intrauterine pregnancy by U/S. 2. (U/S) EDD is consistent with Clinically established Estimated Date of Delivery: 12/13/18 . 3. Normal Anatomy Scan Recommendations: 1.Clinical correlation with the patient's History and Physical Exam. Gweneth Dimitri, RT Review of ULTRASOUND. I have personally reviewed images and report of recent ultrasound done at [facility]. There is a singleton gestation with subjectively normal amniotic fluid volume. The fetal biometry correlates with established dating. Detailed evaluation of the fetal anatomy was performed.The fetal anatomical survey appears within normal limits within the resolution of ultrasound as described above.  It must be noted that a normal ultrasound is unable to rule out fetal aneuploidy.  Barnett Applebaum, MD, [facility], [facility] 08/01/2018  4:17 PM    Assessment:  [redacted] weeks gestation of pregnancy    PNV Supervision of high risk pregnancy, antepartum    Plan APT 3rd trimester History of cesarean delivery, currently pregnant    Counseled VBAC vs elective CS; considering VBAC 38 y.o. [id number] at [redacted]w[redacted]d with Estimated Date of Delivery: 12/13/18 was seen today in office to discuss trial of labor after cesarean section (TOLAC) versus elective repeat cesarean delivery (ERCD). The following risks were discussed with the patient.  Risk of uterine rupture at term is 0.78 percent with TOLAC and 0.22 percent with ERCD. 1 in 10 uterine ruptures will result in neonatal death or neurological injury. The benefits of a trial of labor after cesarean (TOLAC) resulting in a vaginal birth after cesarean (VBAC) include the following: shorter length of hospital stay and postpartum recovery (in most cases); fewer complications, such as postpartum fever,  wound or uterine infection, thromboembolism (blood  clots in the leg or lung), need for blood transfusion and fewer neonatal breathing problems.  The risks of an attempted VBAC or TOLAC include the following: Risk of failed trial of labor after cesarean (TOLAC) without a vaginal birth after cesarean (VBAC) resulting in repeat cesarean delivery (RCD) in about 20 to 40 percent of women who attempt VBAC.  Risk of rupture of uterus resulting in an emergency cesarean delivery. The risk of uterine rupture may be related in part to the type of uterine incision made during the first cesarean delivery. A previous transverse uterine incision has the lowest risk of rupture (0.2 to 1.5 percent risk). Vertical or T-shaped uterine incisions have a higher risk of uterine rupture (4 to 9 percent risk)The risk of fetal death is very low with both VBAC and elective repeat cesarean delivery (ERCD), but the likelihood of fetal death is higher with VBAC than with ERCD. Maternal death is very rare with either type of delivery.  The risks of an elective repeat cesarean delivery (ERCD) were reviewed with the patient including but not limited to: 02/998 risk of uterine rupture which could have serious consequences, bleeding which may require transfusion; infection which may require antibiotics; injury to bowel, bladder or other surrounding organs (bowel, bladder, ureters); injury to the fetus; need for additional procedures including hysterectomy in the event of a life-threatening hemorrhage; thromboembolic phenomenon; abnormal placentation; incisional problems; death and other postoperative or anesthesia complications.    Chronic hypertension affecting pregnancy    Labetalol.  Monitor pressures BMI 37.0-37.9, adult    Nutritional monitoring  Annamarie MajorPaul Malosi Hemstreet, MD, Merlinda FrederickFACOG [facility], [facility] 08/01/2018  4:31 PM

## 2018-08-01 NOTE — Patient Instructions (Signed)
Prenatal Ultrasound A prenatal ultrasound exam, also called a sonogram, is an imaging test that allows your health care provider to see your baby and placenta in the uterus. This is a safe and painless test that does not expose you or your baby to any X-rays, needles, or medicines. Prenatal ultrasounds are done using a handheld plastic device (transducer) that sends out sound waves (ultrasound). The sound waves reflect off your baby's bones and other tissues to create moving images on a computer screen. There are two types of prenatal ultrasound:  Transabdominal ultrasound. During this test, a transducer is placed on your belly and moved around. A routine transabdominal ultrasound is usually done between weeks 18 and 22 of pregnancy (standard ultrasound). It may also be done between weeks 13 and 14.  Transvaginal ultrasound. During this test, a transducer that is shaped like a wand is placed inside your vagina. This type of ultrasound is usually done during early pregnancy. Prenatal ultrasounds may be used to check:  How far along your pregnancy is (stage).  Your baby's development (gestational age).  The location and condition of the organ that supplies your baby with nourishment and oxygen (placenta).  Your baby's heart rate, position, and movements.  Your baby's approximate size and weight.  The amount of fluid surrounding your baby (amniotic fluid).  If you are carrying more than one baby.  Your baby's sex (if your baby is in a position that allows the sex organs to be seen, and if you choose to learn the sex at this time).  If there are any possible problems that require more testing, such as genetic problems.  If your pregnancy is forming outside your uterus (ectopic pregnancy). You may have other ultrasounds as needed at any point during your pregnancy. If your health care provider suspects a problem, you may also have a more detailed type of transabdominal ultrasound (advanced  ultrasound). What are the risks? Generally, this is a safe test. There are no known risks for you or your baby from a prenatal ultrasound. What happens before the test?  Before a transabdominal ultrasound, you may be asked to drink fluid 2 hours before the exam and avoid emptying your bladder. A full bladder helps the images show up more clearly.  Before a transvaginal ultrasound, you may be asked to empty your bladder before the exam.  Wear loose, comfortable clothing so it is easy to undress or expose your lower belly for the exam. What happens during the test? If you are having a transabdominal ultrasound:  You will lie on an exam table.  Your belly will be exposed.  Gel will be rubbed over your belly.  The transducer will be pressed on your belly and moved back and forth, through the gel. You may feel slight pressure, but there should not be any pain.  You may be asked to change your position.  You may hear sounds of blood flow and your baby's heartbeat. You may be able to see images of your baby on the computer screen. Your health care provider may measure your baby's head and other body parts, looking for normal development.  After the exam, the gel will be cleaned off, and you can replace your clothing. You will be able to empty your bladder after the exam is done. If you are having a transvaginal ultrasound:  You will change into a hospital gown or undress from the waist down and cover yourself with a paper sheet.  You will lie down on   an exam table with your feet in footrests (stirrups).  The transducer will be covered with a protective cover and lubricated.  The transducer will be inserted into your vagina.  You may hear sounds of blood flow and your baby's heartbeat. You may be able to see images of your baby on the computer screen.  After the exam, the transducer will be removed, and you can put your clothes back on. What can I expect after the test?  You can  drive yourself home and return to all your normal activities.  A health care provider trained in interpreting ultrasounds will review the images taken during your exam and send a report to your health care provider.  It is up to you to get your test results. Ask your health care provider, or the department that is doing the test, when your results will be ready. Questions to ask your health care provider  Why am I having this prenatal ultrasound?  What information will this exam provide?  How much does this exam cost? What costs will my insurance cover?  Can my partner or support person be with me during the exam?  When can I expect to get the results? Summary  A prenatal ultrasound is a safe and painless imaging exam that gives information about your pregnancy and your developing baby.  Transvaginal ultrasound exams are often done in early pregnancy. Standard transabdominal ultrasounds are typically done between 18 and 22 weeks of pregnancy. You may have other prenatal ultrasounds as needed.  This exam has no risks for you or your baby. After the exam, you can go home and return to all your usual activities. This information is not intended to replace advice given to you by your health care provider. Make sure you discuss any questions you have with your health care provider. Document Released: 02/21/2017 Document Revised: 04/12/2018 Document Reviewed: 02/21/2017 Elsevier Patient Education  2020 Elsevier Inc.  

## 2018-08-01 NOTE — Addendum Note (Signed)
Addended by: Quintella Baton D on: 08/01/2018 04:41 PM   Modules accepted: Orders

## 2018-08-28 ENCOUNTER — Other Ambulatory Visit: Payer: Self-pay

## 2018-08-28 ENCOUNTER — Ambulatory Visit (INDEPENDENT_AMBULATORY_CARE_PROVIDER_SITE_OTHER): Payer: No Typology Code available for payment source | Admitting: Obstetrics & Gynecology

## 2018-08-28 ENCOUNTER — Encounter: Payer: Self-pay | Admitting: Obstetrics & Gynecology

## 2018-08-28 DIAGNOSIS — Z131 Encounter for screening for diabetes mellitus: Secondary | ICD-10-CM

## 2018-08-28 DIAGNOSIS — O34212 Maternal care for vertical scar from previous cesarean delivery: Secondary | ICD-10-CM

## 2018-08-28 DIAGNOSIS — O10919 Unspecified pre-existing hypertension complicating pregnancy, unspecified trimester: Secondary | ICD-10-CM

## 2018-08-28 DIAGNOSIS — O99212 Obesity complicating pregnancy, second trimester: Secondary | ICD-10-CM

## 2018-08-28 DIAGNOSIS — O0992 Supervision of high risk pregnancy, unspecified, second trimester: Secondary | ICD-10-CM

## 2018-08-28 DIAGNOSIS — O0990 Supervision of high risk pregnancy, unspecified, unspecified trimester: Secondary | ICD-10-CM

## 2018-08-28 DIAGNOSIS — O34219 Maternal care for unspecified type scar from previous cesarean delivery: Secondary | ICD-10-CM

## 2018-08-28 DIAGNOSIS — Z3A24 24 weeks gestation of pregnancy: Secondary | ICD-10-CM

## 2018-08-28 DIAGNOSIS — O10912 Unspecified pre-existing hypertension complicating pregnancy, second trimester: Secondary | ICD-10-CM

## 2018-08-28 NOTE — Progress Notes (Signed)
Virtual Visit via Telephone Note  I connected with patient on 08/28/18 at  4:10 PM EDT by telephone and verified that I am speaking with the correct person using two identifiers.   I discussed the limitations, risks, security and privacy concerns of performing an evaluation and management service by telephone and the availability of in person appointments. I also discussed with the patient that there may be a patient responsible charge related to this service. The patient expressed understanding and agreed to proceed.  The patient was at home I spoke with the patient from my  office  Lacey Rodgers is a 38 y.o. [phone] at [redacted]w[redacted]d being seen today for ongoing prenatal care.  She is currently monitored for the following issues for this high-risk pregnancy and has GENITAL HERPES; COMMON MIGRAINE; Essential hypertension; GERD; Migraine aura, persistent; Moderate recurrent major depression ([place]); Tendinitis of left rotator cuff; Skin rash; Infected pierced ear; Cough; History of cesarean delivery, currently pregnant; Chronic hypertension affecting pregnancy; Supervision of high risk pregnancy, antepartum; Obesity affecting pregnancy; and BMI 37.0-37.9, adult on their problem list.  ----------------------------------------------------------------------------------- Patient reports some crampiness at times; she has increased stress due to family issues.   Denies pain, VB, leaking of fluid.  ----------------------------------------------------------------------------------- The following portions of the patient's history were reviewed and updated as appropriate: allergies, current medications, past family history, past medical history, past social history, past surgical history and problem list. Problem list updated.   Objective  Last menstrual period 03/08/2018, unknown if currently breastfeeding. Pregravid weight 192 lb (87.1 kg) Total Weight Gain 23 lb (10.4 kg)  Physical Exam could not be performed.  Because of the COVID-19 outbreak this visit was performed over the phone and not in person.   Home BP today 116/70  Assessment   38 y.o. [id number] at [redacted]w[redacted]d by  12/13/2018, by Last Menstrual Period presenting for routine prenatal visit  Plan   pregnancy4 Problems (from 03/08/18 to present)    Problem Noted Resolved   Supervision of high risk pregnancy, antepartum PNV No   Obesity affecting pregnancy Diet No   BMI 37.0-37.9, adult  No   Chronic hypertension affecting pregnancy Labetalol. BP monitoring. No   History of cesarean delivery, currently pregnant Plans VBAC No     Also, plans Zoloft and Valtrex at 36 weeks for prophylaxis (PPD, HSV)  APT 32 weeks for h/o cHTN but well controlled on meds  [ ]  ultrasound for growth at 28, 32, 36 weeks  [ ]  Aspirin 81 mg daily after 12 weeks; discontinue after 36 weeks  [ ]  IOL 40 weeks  Current antihypertensives:  Labetalol   Baseline and surveillance labs (pulled in from EPIC, refresh links as needed)  Lab Results  Component Value Date   PLT 272 05/01/2018   CREATININE 0.69 05/01/2018   AST 9 05/01/2018   ALT 11 05/01/2018   PROTCRRATIO        11/29/2015   Gestational age appropriate obstetric precautions including but not limited to vaginal bleeding, contractions, leaking of fluid and fetal movement were reviewed in detail with the patient.     Follow Up Instructions: 2 weeks w glucola   I discussed the assessment and treatment plan with the patient. The patient was provided an opportunity to ask questions and all were answered. The patient agreed with the plan and demonstrated an understanding of the instructions.   The patient was advised to call back or seek an in-person evaluation if the symptoms worsen or if the condition fails to improve  as anticipated.  I provided 7 minutes of non-face-to-face time during this encounter.  Return in about 2 weeks (around 09/11/2018) for ROB w glc.  Annamarie MajorPaul Talbert Trembath, MD [facility], [facility] 08/28/2018 4:06 PM

## 2018-09-12 ENCOUNTER — Other Ambulatory Visit: Payer: No Typology Code available for payment source

## 2018-09-12 ENCOUNTER — Other Ambulatory Visit: Payer: Self-pay

## 2018-09-12 ENCOUNTER — Ambulatory Visit (INDEPENDENT_AMBULATORY_CARE_PROVIDER_SITE_OTHER): Payer: No Typology Code available for payment source | Admitting: Advanced Practice Midwife

## 2018-09-12 ENCOUNTER — Encounter: Payer: Self-pay | Admitting: Advanced Practice Midwife

## 2018-09-12 VITALS — BP 128/88 | Wt 218.0 lb

## 2018-09-12 DIAGNOSIS — O34219 Maternal care for unspecified type scar from previous cesarean delivery: Secondary | ICD-10-CM

## 2018-09-12 DIAGNOSIS — O26849 Uterine size-date discrepancy, unspecified trimester: Secondary | ICD-10-CM

## 2018-09-12 DIAGNOSIS — Z131 Encounter for screening for diabetes mellitus: Secondary | ICD-10-CM

## 2018-09-12 DIAGNOSIS — Z3A26 26 weeks gestation of pregnancy: Secondary | ICD-10-CM

## 2018-09-12 DIAGNOSIS — O26842 Uterine size-date discrepancy, second trimester: Secondary | ICD-10-CM

## 2018-09-12 NOTE — Patient Instructions (Signed)
Vaginal Birth After Cesarean Delivery  Vaginal birth after cesarean delivery (VBAC) is giving birth vaginally after previously delivering a baby through a cesarean section (C-section). A VBAC may be a safe option for you, depending on your health and other factors. It is important to discuss VBAC with your health care provider early in your pregnancy so you can understand the risks, benefits, and options. Having these discussions early will give you time to make your birth plan. Who are the best candidates for VBAC? The best candidates for VBAC are women who:  Have had one or two prior cesarean deliveries, and the incision made during the delivery was horizontal (low transverse).  Do not have a vertical (classical) scar on their uterus.  Have not had a tear in the wall of their uterus (uterine rupture).  Plan to have more pregnancies. A VBAC is also more likely to be successful:  In women who have previously given birth vaginally.  When labor starts by itself (spontaneously) before the due date. What are the benefits of VBAC? The benefits of delivering your baby vaginally instead of by a cesarean delivery include:  A shorter hospital stay.  A faster recovery time.  Less pain.  Avoiding risks associated with major surgery, such as infection and blood clots.  Less blood loss and less need for donated blood (transfusions). What are the risks of VBAC? The main risk of attempting a VBAC is that it may fail, forcing your health care provider to deliver your baby by a C-section. Other risks are rare and include:  Tearing (rupture) of the scar from a past cesarean delivery.  Other risks associated with vaginal deliveries. If a repeat cesarean delivery is needed, the risks include:  Blood loss.  Infection.  Blood clot.  Damage to surrounding organs.  Removal of the uterus (hysterectomy), if it is damaged.  Placenta problems in future pregnancies. What else should I know  about my options? Delivering a baby through a VBAC is similar to having a normal spontaneous vaginal delivery. Therefore, it is safe:  To try with twins.  For your health care provider to try to turn the baby from a breech position (external cephalic version) during labor.  With epidural analgesia for pain relief. Consider where you would like to deliver your baby. VBAC should be attempted in facilities where an emergency cesarean delivery can be performed. VBAC is not recommended for home births. Any changes in your health or your baby's health during your pregnancy may make it necessary to change your initial decision about VBAC. Your health care provider may recommend that you do not attempt a VBAC if:  Your baby's suspected weight is 8.8 lb (4 kg) or more.  You have preeclampsia. This is a condition that causes high blood pressure along with other symptoms, such as swelling and headaches.  You will have VBAC less than 19 months after your cesarean delivery.  You are past your due date.  You need to have labor started (induced) because your cervix is not ready for labor (unfavorable). Where to find more information  American Pregnancy Association: americanpregnancy.org  American Congress of Obstetricians and Gynecologists: acog.org Summary  Vaginal birth after cesarean delivery (VBAC) is giving birth vaginally after previously delivering a baby through a cesarean section (C-section). A VBAC may be a safe option for you, depending on your health and other factors.  Discuss VBAC with your health care provider early in your pregnancy so you can understand the risks, benefits, options, and   have plenty of time to make your birth plan.  The main risk of attempting a VBAC is that it may fail, forcing your health care provider to deliver your baby by a C-section. Other risks are rare. This information is not intended to replace advice given to you by your health care provider. Make sure  you discuss any questions you have with your health care provider. Document Released: 06/11/2006 Document Revised: 04/16/2018 Document Reviewed: 03/28/2016 Elsevier Patient Education  2020 Elsevier Inc.  

## 2018-09-12 NOTE — Progress Notes (Signed)
No vb. No lof. 28 week labs today.  

## 2018-09-12 NOTE — Progress Notes (Signed)
  Routine Prenatal Care Visit  Subjective  Lacey Rodgers is a 38 y.o. G4P1021 at [redacted]w[redacted]d being seen today for ongoing prenatal care.  She is currently monitored for the following issues for this high-risk pregnancy and has GENITAL HERPES; COMMON MIGRAINE; Essential hypertension; GERD; Migraine aura, persistent; Moderate recurrent major depression ([place]); Tendinitis of left rotator cuff; Skin rash; Infected pierced ear; Cough; History of cesarean delivery, currently pregnant; Chronic hypertension affecting pregnancy; Supervision of high risk pregnancy, antepartum; Obesity affecting pregnancy; and BMI 37.0-37.9, adult on their problem list.  ----------------------------------------------------------------------------------- Patient reports doing well. She has sharp pain in right lower quadrant when she coughs or sneezes and she also notices some braxton hicks. She feels bigger with this pregnancy. Will schedule for growth scan at next visit.   Contractions: Not present. Vag. Bleeding: None.  Movement: Present. Leaking Fluid denies.  ----------------------------------------------------------------------------------- The following portions of the patient's history were reviewed and updated as appropriate: allergies, current medications, past family history, past medical history, past social history, past surgical history and problem list. Problem list updated.  Objective  Blood pressure 128/88, weight 218 lb (98.9 kg), last menstrual period 03/08/2018 Pregravid weight 192 lb (87.1 kg) Total Weight Gain 26 lb (11.8 kg) Urinalysis: Urine Protein    Urine Glucose    Fetal Status: Fetal Heart Rate (bpm): 158 Fundal Height: 28 cm Movement: Present     General:  Alert, oriented and cooperative. Patient is in no acute distress.  Skin: Skin is warm and dry. No rash noted.   Cardiovascular: Normal heart rate noted  Respiratory: Normal respiratory effort, no problems with respiration noted  Abdomen: Soft,  gravid, appropriate for gestational age. Pain/Pressure: Present     Pelvic:  Cervical exam deferred        Extremities: Normal range of motion.  Edema: None  Mental Status: Normal mood and affect. Normal behavior. Normal judgment and thought content.   Assessment   38 y.o. [id number] at [redacted]w[redacted]d by  12/13/2018, by Last Menstrual Period presenting for routine prenatal visit  Plan   pregnancy4 Problems (from 03/08/18 to present)    Problem Noted Resolved   Supervision of high risk pregnancy, antepartum 05/22/2018 by Will Bonnet, MD No   Obesity affecting pregnancy 05/22/2018 by Will Bonnet, MD No   BMI 37.0-37.9, adult 05/22/2018 by Will Bonnet, MD No   Chronic hypertension affecting pregnancy 05/15/2018 by Gae Dry, MD No   History of cesarean delivery, currently pregnant 04/17/2018 by Gae Dry, MD No    28 week labs today   Preterm labor symptoms and general obstetric precautions including but not limited to vaginal bleeding, contractions, leaking of fluid and fetal movement were reviewed in detail with the patient. Please refer to After Visit Summary for other counseling recommendations.   Return in about 2 weeks (around 09/26/2018) for growth and rob.  Rod Can, CNM 09/12/2018 10:16 AM

## 2018-09-13 LAB — 28 WEEK RH+PANEL
Basophils Absolute: 0 10*3/uL (ref 0.0–0.2)
Basos: 0 %
EOS (ABSOLUTE): 0.1 10*3/uL (ref 0.0–0.4)
Eos: 1 %
Gestational Diabetes Screen: 116 mg/dL (ref 65–139)
HIV Screen 4th Generation wRfx: NONREACTIVE
Hematocrit: 35.5 % (ref 34.0–46.6)
Hemoglobin: 12 g/dL (ref 11.1–15.9)
Immature Grans (Abs): 0.1 10*3/uL (ref 0.0–0.1)
Immature Granulocytes: 1 %
Lymphocytes Absolute: 1.5 10*3/uL (ref 0.7–3.1)
Lymphs: 22 %
MCH: 29.6 pg (ref 26.6–33.0)
MCHC: 33.8 g/dL (ref 31.5–35.7)
MCV: 88 fL (ref 79–97)
Monocytes Absolute: 0.4 10*3/uL (ref 0.1–0.9)
Monocytes: 6 %
Neutrophils Absolute: 4.9 10*3/uL (ref 1.4–7.0)
Neutrophils: 70 %
Platelets: 244 10*3/uL (ref 150–450)
RBC: 4.05 x10E6/uL (ref 3.77–5.28)
RDW: 12.4 % (ref 11.7–15.4)
RPR Ser Ql: NONREACTIVE
WBC: 7 10*3/uL (ref 3.4–10.8)

## 2018-09-27 ENCOUNTER — Ambulatory Visit (INDEPENDENT_AMBULATORY_CARE_PROVIDER_SITE_OTHER): Payer: No Typology Code available for payment source

## 2018-09-27 ENCOUNTER — Ambulatory Visit (INDEPENDENT_AMBULATORY_CARE_PROVIDER_SITE_OTHER): Payer: No Typology Code available for payment source | Admitting: Obstetrics and Gynecology

## 2018-09-27 ENCOUNTER — Other Ambulatory Visit: Payer: Self-pay

## 2018-09-27 VITALS — BP 114/66 | Wt 218.0 lb

## 2018-09-27 DIAGNOSIS — Z362 Encounter for other antenatal screening follow-up: Secondary | ICD-10-CM

## 2018-09-27 DIAGNOSIS — O34219 Maternal care for unspecified type scar from previous cesarean delivery: Secondary | ICD-10-CM

## 2018-09-27 DIAGNOSIS — O26849 Uterine size-date discrepancy, unspecified trimester: Secondary | ICD-10-CM

## 2018-09-27 DIAGNOSIS — O99343 Other mental disorders complicating pregnancy, third trimester: Secondary | ICD-10-CM

## 2018-09-27 DIAGNOSIS — Z3A29 29 weeks gestation of pregnancy: Secondary | ICD-10-CM

## 2018-09-27 DIAGNOSIS — F331 Major depressive disorder, recurrent, moderate: Secondary | ICD-10-CM

## 2018-09-27 DIAGNOSIS — O10913 Unspecified pre-existing hypertension complicating pregnancy, third trimester: Secondary | ICD-10-CM

## 2018-09-27 DIAGNOSIS — O10919 Unspecified pre-existing hypertension complicating pregnancy, unspecified trimester: Secondary | ICD-10-CM

## 2018-09-27 DIAGNOSIS — O0990 Supervision of high risk pregnancy, unspecified, unspecified trimester: Secondary | ICD-10-CM

## 2018-09-27 LAB — POCT URINALYSIS DIPSTICK OB
Glucose, UA: NEGATIVE
POC,PROTEIN,UA: NEGATIVE

## 2018-09-27 NOTE — Progress Notes (Signed)
ROB °Growth scan today °

## 2018-09-27 NOTE — Progress Notes (Signed)
Routine Prenatal Care Visit  Subjective  Lacey Rodgers is a 38 y.o. G4P1021 at [redacted]w[redacted]d being seen today for ongoing prenatal care.  She is currently monitored for the following issues for this high-risk pregnancy and has GENITAL HERPES; COMMON MIGRAINE; Essential hypertension; GERD; Migraine aura, persistent; Moderate recurrent major depression ([facility]); Tendinitis of left rotator cuff; Skin rash; Infected pierced ear; Cough; History of cesarean delivery, currently pregnant; Chronic hypertension affecting pregnancy; Supervision of high risk pregnancy, antepartum; Obesity affecting pregnancy; and BMI 37.0-37.9, adult on their problem list.  ----------------------------------------------------------------------------------- Patient reports no complaints.   Contractions: Not present. Vag. Bleeding: None.  Movement: Present. Denies leaking of fluid.  ----------------------------------------------------------------------------------- The following portions of the patient's history were reviewed and updated as appropriate: allergies, current medications, past family history, past medical history, past social history, past surgical history and problem list. Problem list updated.   Objective  Blood pressure 114/66, weight 218 lb (98.9 kg), last menstrual period 03/08/2018, unknown if currently breastfeeding. Pregravid weight 192 lb (87.1 kg) Total Weight Gain 26 lb (11.8 kg) Urinalysis:      Fetal Status: Fetal Heart Rate (bpm): 145 Fundal Height: 31 cm Movement: Present  Presentation: Vertex  General:  Alert, oriented and cooperative. Patient is in no acute distress.  Skin: Skin is warm and dry. No rash noted.   Cardiovascular: Normal heart rate noted  Respiratory: Normal respiratory effort, no problems with respiration noted  Abdomen: Soft, gravid, appropriate for gestational age. Pain/Pressure: Present     Pelvic:  Cervical exam deferred        Extremities: Normal range of motion.     ental  Status: Normal mood and affect. Normal behavior. Normal judgment and thought content.   US Ob Follow Up  Result Date: 09/27/2018 Patient Name: Lacey Rodgers DOB: [birth year] MRN: [id number] ULTRASOUND REPORT Location: [facility] Date of Service: 09/27/2018 Indications:growth/afi Findings: Mason Jim intrauterine pregnancy is visualized with FHR at 144 BPM. Biometrics give an (U/S) Gestational age of [redacted]w[redacted]d and an (U/S) EDD of 12/13/2018; this correlates with the clinically established Estimated Date of Delivery: 12/13/18. Fetal presentation is Cephalic. Placenta: anterior. Grade: 1 AFI: 15.5 cm Growth percentile is 52.2%. EFW: 1322 g ( 2 lb 15 oz ) Impression: 1. [redacted]w[redacted]d Viable Singleton Intrauterine pregnancy previously established criteria. 2. Growth is 52.2 %ile.  AFI is 15.5 cm. Recommendations: 1.Clinical correlation with the patient's History and Physical Exam. Deanna Artis, RT There is a singleton gestation with normal amniotic fluid volume. The fetal biometry correlates with established dating.  Limited fetal anatomy was performed.The visualized fetal anatomical survey appears within normal limits within the resolution of ultrasound as described above.  It must be noted that a normal ultrasound is unable to rule out fetal aneuploidy.  Vena Austria, MD, [facility], [facility] 09/27/2018, 3:06 PM     Assessment   38 y.o. [id number] at [redacted]w[redacted]d by  12/13/2018, by Last Menstrual Period presenting for routine prenatal visit  Plan   pregnancy4 Problems (from 03/08/18 to present)    Problem Noted Resolved   Supervision of high risk pregnancy, antepartum 05/22/2018 by Conard Novak, MD No   Obesity affecting pregnancy 05/22/2018 by Conard Novak, MD No   BMI 37.0-37.9, adult 05/22/2018 by Conard Novak, MD No   Chronic hypertension affecting pregnancy 05/15/2018 by Nadara Mustard, MD No   History of cesarean delivery, currently pregnant 04/17/2018 by Nadara Mustard, MD No       Gestational  age appropriate obstetric precautions including but not limited to vaginal bleeding, contractions, leaking of fluid and fetal movement were reviewed in detail with the patient.     1) History of C-section - opts for repeat C-section.  Scheduled C-section 12/10/2018 SDJ  2) CHTN - well controlled on current dose of labetalol  Return in about 2 weeks (around 10/11/2018) for ROB.  Malachy Mood, MD, [facility], [facility] 09/27/2018, 3:43 PM

## 2018-10-01 ENCOUNTER — Telehealth: Payer: Self-pay | Admitting: Obstetrics and Gynecology

## 2018-10-01 NOTE — Telephone Encounter (Signed)
-----   Message from Malachy Mood, MD sent at 09/27/2018  7:06 PM EDT ----- Regarding: C-section Surgery Date: 12/10/2018  LOS: surgery admit  Surgery Booking Request Patient Full Name: Lacey Rodgers MRN: [id number]  DOB: July 29, 1980  Surgeon: Prentice Docker, MD Requested Surgery Date and Time: 7:30 Primary Diagnosis and Code: History of cesarean section Secondary Diagnosis and Code:  Surgical Procedure: Cesarean Section L&D Notification:yes Admission Status: surgery admit Length of Surgery: 1hr Special Case Needs: none H&P: week prior (date) Phone Interview or Office Pre-Admit: pre-admit Interpreter: No Language: English Medical Clearance: No Special Scheduling Instructions: none

## 2018-10-01 NOTE — Telephone Encounter (Signed)
Lmtrc

## 2018-10-02 NOTE — Telephone Encounter (Signed)
Patient is aware of H&P on 12/03/18 @ 4:10pm w/ Dr. Glennon Mac, Pre-admit testing to be scheduled, COVID testing on 12/06/18, and OR on 12/10/18.

## 2018-10-15 ENCOUNTER — Other Ambulatory Visit: Payer: Self-pay

## 2018-10-15 ENCOUNTER — Ambulatory Visit (INDEPENDENT_AMBULATORY_CARE_PROVIDER_SITE_OTHER): Payer: No Typology Code available for payment source | Admitting: Obstetrics and Gynecology

## 2018-10-15 VITALS — BP 122/80 | Wt 219.0 lb

## 2018-10-15 DIAGNOSIS — Z6837 Body mass index (BMI) 37.0-37.9, adult: Secondary | ICD-10-CM

## 2018-10-15 DIAGNOSIS — Z23 Encounter for immunization: Secondary | ICD-10-CM

## 2018-10-15 DIAGNOSIS — O99213 Obesity complicating pregnancy, third trimester: Secondary | ICD-10-CM

## 2018-10-15 DIAGNOSIS — Z3A31 31 weeks gestation of pregnancy: Secondary | ICD-10-CM

## 2018-10-15 DIAGNOSIS — O0990 Supervision of high risk pregnancy, unspecified, unspecified trimester: Secondary | ICD-10-CM

## 2018-10-15 DIAGNOSIS — O0993 Supervision of high risk pregnancy, unspecified, third trimester: Secondary | ICD-10-CM

## 2018-10-15 DIAGNOSIS — O10913 Unspecified pre-existing hypertension complicating pregnancy, third trimester: Secondary | ICD-10-CM

## 2018-10-15 DIAGNOSIS — O10919 Unspecified pre-existing hypertension complicating pregnancy, unspecified trimester: Secondary | ICD-10-CM

## 2018-10-15 DIAGNOSIS — O34219 Maternal care for unspecified type scar from previous cesarean delivery: Secondary | ICD-10-CM

## 2018-10-15 DIAGNOSIS — O99211 Obesity complicating pregnancy, first trimester: Secondary | ICD-10-CM

## 2018-10-15 NOTE — Progress Notes (Signed)
ROB C/o pelvic pain/ pressure Tdap/BT consent today flu shot done at [facility] put in HM Denies lof, no vb Good FM

## 2018-10-15 NOTE — Progress Notes (Signed)
Routine Prenatal Care Visit  Subjective  Lacey Rodgers is a 38 y.o. G4P1021 at 42w4dbeing seen today for ongoing prenatal care.  She is currently monitored for the following issues for this high-risk pregnancy and has GENITAL HERPES; COMMON MIGRAINE; Essential hypertension; GERD; Migraine aura, persistent; Moderate recurrent major depression ([place]; Tendinitis of left rotator cuff; Skin rash; Infected pierced ear; Cough; History of cesarean delivery, currently pregnant; Chronic hypertension affecting pregnancy; Supervision of high risk pregnancy, antepartum; Obesity affecting pregnancy; and BMI 37.0-37.9, adult on their problem list.  ----------------------------------------------------------------------------------- Patient reports no complaints.    .  .   . Denies leaking of fluid.  ----------------------------------------------------------------------------------- The following portions of the patient's history were reviewed and updated as appropriate: allergies, current medications, past family history, past medical history, past social history, past surgical history and problem list. Problem list updated.   Objective  Blood pressure 122/80, weight 219 lb (99.3 kg), last menstrual period 03/08/2018, unknown if currently breastfeeding. Pregravid weight 192 lb (87.1 kg) Total Weight Gain 27 lb (12.2 kg) Urinalysis:      Fetal Status:           General:  Alert, oriented and cooperative. Patient is in no acute distress.  Skin: Skin is warm and dry. No rash noted.   Cardiovascular: Normal heart rate noted  Respiratory: Normal respiratory effort, no problems with respiration noted  Abdomen: Soft, gravid, appropriate for gestational age.       Pelvic:  Cervical exam deferred        Extremities: Normal range of motion.     ental Status: Normal mood and affect. Normal behavior. Normal judgment and thought content.   Immunization History  Administered Date(s) Administered  .  Influenza,inj,Quad PF,6+ Mos 11/12/2013, 10/16/2017  . MMR 12/19/2015  . Tdap 10/28/2015, 10/15/2018     Assessment   38y.o. [id number] 338w4dy  12/13/2018, by Last Menstrual Period presenting for routine prenatal visit  Plan   pregnancy4 Problems (from 03/08/18 to present)    Problem Noted Resolved   Supervision of high risk pregnancy, antepartum 05/22/2018 by JaWill BonnetMD No   Overview Signed 10/16/2018  1:16 PM by StMalachy MoodMD    Clinic [facility] Prenatal Labs  Dating LMP = 7 week USKorealood type: O/Positive/-- (04/29 0935)   Genetic Screen NIPS: Normal XY Antibody:Negative (04/29 0935)  Anatomic USK[place]ormal, anterior placenta Rubella: 1.92 (04/29 0935) Varicella: Immune  GTT Early: 100 Third trimester: 116 RPR: Non Reactive (09/10 1047)   Rhogam N/A HBsAg: Negative (04/29 0935)   TDaP vaccine 10/15/2018  Flu Shot: HIV: Non Reactive (09/10 1047)   Baby Food                                GBS:   Contraception  Pap: 05/06/2015 NIL HPV negative  CBB     CS/VBAC Repeat 12/10/2018   Support Person            Obesity affecting pregnancy 05/22/2018 by JaWill BonnetMD No   BMI 37.0-37.9, adult 05/22/2018 by JaWill BonnetMD No   Chronic hypertension affecting pregnancy 05/15/2018 by HaGae DryMD No   Overview Signed 10/16/2018  1:18 PM by StMalachy MoodMD    [X] Aspirin 81 mg daily after 12 weeks; discontinue after 36 weeks [X] baseline labs with CBC, CMP, urine protein/creatinine ratio [ ] no BP meds unless BPs become  elevated [ ] ultrasound for growth at 29 1322 g ( 2 lb 15 oz ) 52.2%ile, 33, 37 weeks   Current antihypertensives:  Labetalol and Methyldopa   Baseline and surveillance labs (pulled in from [facility], refresh links as needed)  Lab Results  Component Value Date   PLT 244 09/12/2018   CREATININE 0.69 05/01/2018   AST 9 05/01/2018   ALT 11 05/01/2018   PROTCRRATIO        11/29/2015    Antenatal Testing CHTN - O10.919  Group  I  BP < 140/90, no preeclampsia, AGA,  nml AFV, +/- meds    Group II BP > 140/90, on meds, no preeclampsia, AGA, nml AFV  20-28-34-38  20-24-28-32-35-38  32//2 x wk  28//BPP wkly then 32//2 x wk  40 no meds; 39 meds  PRN or 37  Pre-eclampsia  GHTN - O13.9/Preeclampsia without severe features  - O14.00   Preeclampsia with severe features - O14.10  Q 3-4wks  Q 2 wks  28//BPP wkly then 32//2 x wk  Inpatient  37  PRN or 34        History of cesarean delivery, currently pregnant 04/17/2018 by Gae Dry, MD No      Gestational age appropriate obstetric precautions including but not limited to vaginal bleeding, contractions, leaking of fluid and fetal movement were reviewed in detail with the patient.    Return in about 2 weeks (around 10/29/2018) for ROB and growth scan.  Malachy Mood, MD, [facility], [facility] 10/15/2018, 4:25 PM

## 2018-10-21 ENCOUNTER — Ambulatory Visit (INDEPENDENT_AMBULATORY_CARE_PROVIDER_SITE_OTHER): Payer: No Typology Code available for payment source | Admitting: Family Medicine

## 2018-10-21 ENCOUNTER — Encounter: Payer: Self-pay | Admitting: Family Medicine

## 2018-10-21 VITALS — Temp 98.0°F | Ht 60.0 in

## 2018-10-21 DIAGNOSIS — J0100 Acute maxillary sinusitis, unspecified: Secondary | ICD-10-CM | POA: Diagnosis not present

## 2018-10-21 MED ORDER — AMOXICILLIN 875 MG PO TABS: 875.0000 mg | ORAL_TABLET | Freq: Two times a day (BID) | ORAL | 0 refills | Status: DC

## 2018-10-21 NOTE — Progress Notes (Signed)
     Lacey Jurgens T. Lynna Zamorano, MD Primary Care and [place] at [facility] [place], [address] Phone: [phone]  FAX: [phone]  Lacey Rodgers - 38 y.o. female  MRN [id number]  Date of Birth: 27-Jun-1980  Visit Date: 10/21/2018  PCP: Owens Loffler, MD  Referred by: Owens Loffler, MD Chief Complaint  Patient presents with  . Sinusitis  . Sore Throat  . Nasal Congestion   Virtual Visit via Video Note:  I connected with  Lacey Rodgers on 10/21/2018 10:40 AM EDT by a video enabled telemedicine application and verified that I am speaking with the correct person using two identifiers.   Location patient: home computer, tablet, or smartphone Location provider: work or home office Consent: Verbal consent directly obtained from [employer]. Persons participating in the virtual visit: patient, provider  I discussed the limitations of evaluation and management by telemedicine and the availability of in person appointments. The patient expressed understanding and agreed to proceed.  History of Present Illness:  She is a very nice lady, [redacted] weeks pregnant, and has had some significant L maxillary pain for about a week. No real other symptoms other than minimal sore throat. No fever, chills, neuro sx, or gi sx.  Review of Systems as above: See pertinent positives and pertinent negatives per HPI No acute distress verbally  Past Medical History, Surgical History, Social History, Family History, Problem List, Medications, and Allergies have been reviewed and updated if relevant.   Observations/Objective/Exam:  An attempt was made to discern vital signs over the phone and per patient if applicable and possible.   General:    Alert, Oriented, appears well and in no acute distress HEENT:     Atraumatic, conjunctiva clear, no obvious abnormalities on inspection of external nose and ears.  Neck:    Normal movements of the  head and neck Pulmonary:     On inspection no signs of respiratory distress, breathing rate appears normal, no obvious gross SOB, gasping or wheezing Cardiovascular:    No obvious cyanosis Musculoskeletal:    Moves all visible extremities without noticeable abnormality Psych / Neurological:     Pleasant and cooperative, no obvious depression or anxiety, speech and thought processing grossly intact  Assessment and Plan:    ICD-10-CM   1. Acute non-recurrent maxillary sinusitis  J01.00    Safe to assume sinusitis, other virus causing nasal congestion is possible  I discussed the assessment and treatment plan with the patient. The patient was provided an opportunity to ask questions and all were answered. The patient agreed with the plan and demonstrated an understanding of the instructions.   The patient was advised to call back or seek an in-person evaluation if the symptoms worsen or if the condition fails to improve as anticipated.  Follow-up: prn unless noted otherwise below No follow-ups on file.  Meds ordered this encounter  Medications  . amoxicillin (AMOXIL) 875 MG tablet    Sig: Take 1 tablet (875 mg total) by mouth 2 (two) times daily.    Dispense:  20 tablet    Refill:  0   No orders of the defined types were placed in this encounter.   Signed,  Maud Deed. Bernhardt Riemenschneider, MD

## 2018-10-30 ENCOUNTER — Other Ambulatory Visit: Payer: Self-pay

## 2018-10-30 ENCOUNTER — Ambulatory Visit (INDEPENDENT_AMBULATORY_CARE_PROVIDER_SITE_OTHER): Payer: No Typology Code available for payment source | Admitting: Obstetrics and Gynecology

## 2018-10-30 ENCOUNTER — Ambulatory Visit (INDEPENDENT_AMBULATORY_CARE_PROVIDER_SITE_OTHER): Payer: No Typology Code available for payment source

## 2018-10-30 VITALS — BP 138/80 | Wt 222.0 lb

## 2018-10-30 DIAGNOSIS — O99213 Obesity complicating pregnancy, third trimester: Secondary | ICD-10-CM | POA: Diagnosis not present

## 2018-10-30 DIAGNOSIS — O0990 Supervision of high risk pregnancy, unspecified, unspecified trimester: Secondary | ICD-10-CM

## 2018-10-30 DIAGNOSIS — Z3A33 33 weeks gestation of pregnancy: Secondary | ICD-10-CM

## 2018-10-30 DIAGNOSIS — O10913 Unspecified pre-existing hypertension complicating pregnancy, third trimester: Secondary | ICD-10-CM

## 2018-10-30 DIAGNOSIS — O99211 Obesity complicating pregnancy, first trimester: Secondary | ICD-10-CM

## 2018-10-30 DIAGNOSIS — O34219 Maternal care for unspecified type scar from previous cesarean delivery: Secondary | ICD-10-CM | POA: Diagnosis not present

## 2018-10-30 DIAGNOSIS — O0993 Supervision of high risk pregnancy, unspecified, third trimester: Secondary | ICD-10-CM

## 2018-10-30 DIAGNOSIS — O10919 Unspecified pre-existing hypertension complicating pregnancy, unspecified trimester: Secondary | ICD-10-CM

## 2018-10-30 DIAGNOSIS — Z0289 Encounter for other administrative examinations: Secondary | ICD-10-CM

## 2018-10-30 NOTE — Progress Notes (Signed)
Routine Prenatal Care Visit  Subjective  Lacey Rodgers is a 38 y.o. G4P1021 at [redacted]w[redacted]d being seen today for ongoing prenatal care.  She is currently monitored for the following issues for this high-risk pregnancy and has GENITAL HERPES; COMMON MIGRAINE; Essential hypertension; GERD; Migraine aura, persistent; Moderate recurrent major depression (Lacey Rodgers); Tendinitis of left rotator cuff; Skin rash; Infected pierced ear; Cough; History of cesarean delivery, currently pregnant; Chronic hypertension affecting pregnancy; Supervision of high risk pregnancy, antepartum; Obesity affecting pregnancy; and BMI 37.0-37.9, adult on their problem list.  ----------------------------------------------------------------------------------- Patient reports no complaints.   Contractions: Not present. Vag. Bleeding: None.  Movement: Present. Denies leaking of fluid.  ----------------------------------------------------------------------------------- The following portions of the patient's history were reviewed and updated as appropriate: allergies, current medications, past family history, past medical history, past social history, past surgical history and problem list. Problem list updated.   Objective  Blood pressure 138/80, weight 222 lb (100.7 kg), last menstrual period 03/08/2018, unknown if currently breastfeeding. Pregravid weight 192 lb (87.1 kg) Total Weight Gain 30 lb (13.6 kg) Urinalysis:      Fetal Status: Fetal Heart Rate (bpm): 150 Fundal Height: 33 cm Movement: Present  Presentation: Vertex  General:  Alert, oriented and cooperative. Patient is in no acute distress.  Skin: Skin is warm and dry. No rash noted.   Cardiovascular: Normal heart rate noted  Respiratory: Normal respiratory effort, no problems with respiration noted  Abdomen: Soft, gravid, appropriate for gestational age. Pain/Pressure: Present     Pelvic:  Cervical exam deferred        Extremities: Normal range of motion.  Edema: None   ental Status: Normal Rodgers and affect. Normal behavior. Normal judgment and thought content.   US Ob Follow Up  Result Date: 10/30/2018 Patient Name: Lacey Rodgers DOB: 19-May-1980 MRN: [id number] ULTRASOUND [facility]PORT [facility] Date of Service: 10/30/2018 Indications:growth/afi Findings: Lacey Rodgers intrauterine pregnancy is visualized with FHR at 124 BPM. Biometrics give an (U/S) Gestational age of [redacted]w[redacted]d and an (U/S) EDD of 12/14/2018; this correlates with the clinically established Estimated Date of Delivery: 12/13/18. Fetal presentation is Cephalic. Placenta: anterior. Grade: 1 AFI: 14.6 cm Growth percentile is 46.4%. EFW: 2279 g  ( 5 lb 0 oz ) Impression: 1. [redacted]w[redacted]d Viable Singleton Intrauterine pregnancy previously established criteria. 2. Growth is 46.4 %ile.  AFI is 14.6 cm. Recommendations: 1.Clinical correlation with the patient's History and Physical Exam. Lacey Rodgers, RT There is a singleton gestation with normal amniotic fluid volume. The fetal biometry correlates with established dating.  Limited fetal anatomy was performed.The visualized fetal anatomical survey appears within normal limits within the resolution of ultrasound as described above.  It must be noted that a normal ultrasound is unable to rule out fetal aneuploidy.  Lacey Mood, MD, [facility], [facility] 10/30/2018, 4:45 PM   Baseline: 150 Variability: moderate Accelerations: present Decelerations: absent The patient was monitored for 30 minutes, fetal heart rate tracing was deemed reactive, category I tracing,  CPT [phone]   Assessment   38 y.o. [id number] at [redacted]w[redacted]d by  12/13/2018, by Last Menstrual Period presenting for routine prenatal visit  Plan   pregnancy4 Problems (from 03/08/18 to present)    Problem Noted Resolved   Supervision of high risk pregnancy, antepartum 05/22/2018 by Lacey Bonnet, MD No   Overview Signed 10/16/2018  1:16 PM by Lacey Mood, MD     [facility]inic [facility] Prenatal Labs  Dating LMP = 7 week [place] Blood type: O/Positive/-- (04/29  6440)   Genetic Screen NIPS: Normal XY Antibody:Negative (04/29 0935)  Anatomic [place] Normal, anterior placenta Rubella: 1.92 (04/29 0935) Varicella: Immune  GTT Early: 100 Third trimester: 116 RPR: Non Reactive (09/10 1047)   Rhogam N/A HBsAg: Negative (04/29 0935)   TDaP vaccine 10/15/2018  Flu Shot: HIV: Non Reactive (09/10 1047)   Baby Food                                GBS:   Contraception  Pap: 05/06/2015 NIL HPV negative  CBB     CS/VBAC Repeat 12/10/2018   Support Person            Obesity affecting pregnancy 05/22/2018 by Lacey Novak, MD No   BMI 37.0-37.9, adult 05/22/2018 by Lacey Novak, MD No   Chronic hypertension affecting pregnancy 05/15/2018 by Lacey Mustard, MD No   Overview Addendum 10/30/2018  4:45 PM by Lacey Austria, MD    [X]  Aspirin 81 mg daily after 12 weeks; discontinue after 36 weeks [X]  baseline labs with CBC, CMP, urine protein/creatinine ratio [ ]  no BP meds unless BPs become elevated [ ]  ultrasound for growth at 29 1322 g ( 2 lb 15 oz ) 52.2%ile, 33 2279 g  ( 5 lb 0 oz ) 44.7%ile, 37 weeks   Current antihypertensives:  Labetalol and Methyldopa   Baseline and surveillance labs (pulled in from [facility], refresh links as needed)  Lab Results  Component Value Date   PLT 244 09/12/2018   CREATININE 0.69 05/01/2018   AST 9 05/01/2018   ALT 11 05/01/2018   PROTCRRATIO        11/29/2015    Antenatal Testing CHTN - O10.919  Group I  BP < 140/90, no preeclampsia, AGA,  nml AFV, +/- meds    Group II BP > 140/90, on meds, no preeclampsia, AGA, nml AFV  20-28-34-38  20-24-28-32-35-38  32//2 x wk  28//BPP wkly then 32//2 x wk  40 no meds; 39 meds  PRN or 37  Pre-eclampsia  GHTN - O13.9/Preeclampsia without severe features  - O14.00   Preeclampsia with severe features - O14.10  Q 3-4wks  Q 2 wks  28//BPP wkly then 32//2 x wk  Inpatient  37   PRN or 34        History of cesarean delivery, currently pregnant 04/17/2018 by 04-01-2004, MD No       Gestational age appropriate obstetric precautions including but not limited to vaginal bleeding, contractions, leaking of fluid and fetal movement were reviewed in detail with the patient.    1) CHTN  - BP remains well controlled - Growth scan and AFI normal today - NST reactive today, once weekly NST/AFI for surveillance for well controlled CHTN on meds  Return in about 1 week (around 11/06/2018) for ROB, NST, AFI.  09-30-1987, MD, 04/19/2018 OB/GYN, [facility]

## 2018-10-30 NOTE — Progress Notes (Signed)
ROB C/o braxton hicks, pelvic pressure and pain Denies lof, no vb Good FM

## 2018-10-31 ENCOUNTER — Telehealth: Payer: Self-pay

## 2018-10-31 NOTE — Telephone Encounter (Signed)
FMLA/DISABILITY form for [employer] filled out, signature obtained and given to KT for processing. 

## 2018-11-07 ENCOUNTER — Ambulatory Visit (INDEPENDENT_AMBULATORY_CARE_PROVIDER_SITE_OTHER): Payer: No Typology Code available for payment source

## 2018-11-07 ENCOUNTER — Telehealth: Payer: Self-pay

## 2018-11-07 ENCOUNTER — Ambulatory Visit (INDEPENDENT_AMBULATORY_CARE_PROVIDER_SITE_OTHER): Payer: No Typology Code available for payment source | Admitting: Obstetrics and Gynecology

## 2018-11-07 ENCOUNTER — Other Ambulatory Visit: Payer: Self-pay

## 2018-11-07 VITALS — BP 134/74 | Wt 222.0 lb

## 2018-11-07 DIAGNOSIS — O10913 Unspecified pre-existing hypertension complicating pregnancy, third trimester: Secondary | ICD-10-CM

## 2018-11-07 DIAGNOSIS — O99213 Obesity complicating pregnancy, third trimester: Secondary | ICD-10-CM

## 2018-11-07 DIAGNOSIS — O163 Unspecified maternal hypertension, third trimester: Secondary | ICD-10-CM | POA: Diagnosis not present

## 2018-11-07 DIAGNOSIS — O34219 Maternal care for unspecified type scar from previous cesarean delivery: Secondary | ICD-10-CM | POA: Diagnosis not present

## 2018-11-07 DIAGNOSIS — O99211 Obesity complicating pregnancy, first trimester: Secondary | ICD-10-CM

## 2018-11-07 DIAGNOSIS — O10919 Unspecified pre-existing hypertension complicating pregnancy, unspecified trimester: Secondary | ICD-10-CM

## 2018-11-07 DIAGNOSIS — O0990 Supervision of high risk pregnancy, unspecified, unspecified trimester: Secondary | ICD-10-CM

## 2018-11-07 DIAGNOSIS — Z3A34 34 weeks gestation of pregnancy: Secondary | ICD-10-CM

## 2018-11-07 MED ORDER — PROCTOFOAM HC 1-1 % EX FOAM: 1.0000 | Freq: Two times a day (BID) | CUTANEOUS | 1 refills | Status: DC

## 2018-11-07 NOTE — Telephone Encounter (Signed)
Pt called to check to see if Dr Shelda Pal sent in her rx, pt aware its at the [facility] pharmacy

## 2018-11-07 NOTE — Progress Notes (Signed)
Routine Prenatal Care Visit  Subjective  Lacey Rodgers is a 38 y.o. G4P1021 at 2839w6d being seen today for ongoing prenatal care.  She is currently monitored for the following issues for this high-risk pregnancy and has GENITAL HERPES; COMMON MIGRAINE; Essential hypertension; GERD; Migraine aura, persistent; Moderate recurrent major depression (HCC); Tendinitis of left rotator cuff; Skin rash; Infected pierced ear; Cough; History of cesarean delivery, currently pregnant; Chronic hypertension affecting pregnancy; Supervision of high risk pregnancy, antepartum; Obesity affecting pregnancy; and BMI 37.0-37.9, adult on their problem list.  ----------------------------------------------------------------------------------- Patient reports pressure.   Contractions: Not present. Vag. Bleeding: None.  Movement: Present. Denies leaking of fluid.  ----------------------------------------------------------------------------------- The following portions of the patient's history were reviewed and updated as appropriate: allergies, current medications, past family history, past medical history, past social history, past surgical history and problem list. Problem list updated.   Objective  Blood pressure 134/74, weight 222 lb (100.7 kg), last menstrual period 03/08/2018, unknown if currently breastfeeding. Pregravid weight 192 lb (87.1 kg) Total Weight Gain 30 lb (13.6 kg) Urinalysis:      Fetal Status: Fetal Heart Rate (bpm): 140 Fundal Height: 34 cm Movement: Present  Presentation: Vertex  General:  Alert, oriented and cooperative. Patient is in no acute distress.  Skin: Skin is warm and dry. No rash noted.   Cardiovascular: Normal heart rate noted  Respiratory: Normal respiratory effort, no problems with respiration noted  Abdomen: Soft, gravid, appropriate for gestational age. Pain/Pressure: Present     Pelvic:  Cervical exam deferred Dilation: Closed Effacement (%): 0 Station: -3  Extremities:  Normal range of motion.     ental Status: Normal mood and affect. Normal behavior. Normal judgment and thought content.   [place] Ob Limited  Result Date: 11/07/2018 Patient Name: Lacey BorgStephanie Rodgers DOB: [birth year] MRN: [id number] ULTRASOUND REPORT Location: [facility] Date of Service: 11/07/2018 Indications:AFI Findings: Mason JimSingleton intrauterine pregnancy is visualized with FHR at 142 BPM. Fetal presentation is Cephalic. Placenta: anterior. Grade: 1 AFI: 20.4 cm Impression: 1. 5939w6d Viable Singleton Intrauterine pregnancy dated by previously established criteria. 2. AFI is 20.4 cm. Recommendations: 1.Clinical correlation with the patient's History and Physical Exam. Deanna ArtisElyse S Fairbanks, RT There is a singleton gestation with normal amniotic fluid volume. The visualized fetal anatomical survey appears within normal limits within the resolution of ultrasound as described above.  It must be noted that a normal ultrasound is unable to rule out fetal aneuploidy.  Vena AustriaAndreas Deylan Canterbury, MD, Merlinda FrederickFACOG [facility], [facility] 11/07/2018, 9:22 AM   [place] Ob Follow Up  Result Date: 10/30/2018 Patient Name: Lacey BorgStephanie Rodgers DOB: [birth year] MRN: [id number] ULTRASOUND REPORT Location: [facility] Date of Service: 10/30/2018 Indications:growth/afi Findings: Mason JimSingleton intrauterine pregnancy is visualized with FHR at 124 BPM. Biometrics give an (U/S) Gestational age of 2571w4d and an (U/S) EDD of 12/14/2018; this correlates with the clinically established Estimated Date of Delivery: 12/13/18. Fetal presentation is Cephalic. Placenta: anterior. Grade: 1 AFI: 14.6 cm Growth percentile is 46.4%. EFW: 2279 g  ( 5 lb 0 oz ) Impression: 1. 5466w5d Viable Singleton Intrauterine pregnancy previously established criteria. 2. Growth is 46.4 %ile.  AFI is 14.6 cm. Recommendations: 1.Clinical correlation with the patient's History and Physical Exam. Deanna ArtisElyse S Fairbanks, RT There is a singleton gestation with normal amniotic fluid volume. The  fetal biometry correlates with established dating.  Limited fetal anatomy was performed.The visualized fetal anatomical survey appears within normal limits within the resolution of ultrasound as described above.  It must be noted that  a normal ultrasound is unable to rule out fetal aneuploidy.  Malachy Mood, MD, [facility], [facility] 10/30/2018, 4:45 PM   Baseline: 140 Variability: moderate Accelerations: present Decelerations: absent The patient was monitored for 30 minutes, fetal heart rate tracing was deemed reactive, category I tracing,  Assessment   38 y.o. A1P3790 at [redacted]w[redacted]d by  12/13/2018, by Last Menstrual Period presenting for routine prenatal visit  Plan   pregnancy4 Problems (from 03/08/18 to present)    Problem Noted Resolved   Supervision of high risk pregnancy, antepartum 05/22/2018 by Will Bonnet, MD No   Overview Signed 10/16/2018  1:16 PM by Malachy Mood, MD    Clinic [facility] Prenatal Labs  Dating LMP = 7 week [place] Blood type: O/Positive/-- (04/29 0935)   Genetic Screen NIPS: Normal XY Antibody:Negative (04/29 0935)  Anatomic [place] Normal, anterior placenta Rubella: 1.92 (04/29 0935) Varicella: Immune  GTT Early: 100 Third trimester: 116 RPR: Non Reactive (09/10 1047)   Rhogam N/A HBsAg: Negative (04/29 0935)   TDaP vaccine 10/15/2018  Flu Shot: HIV: Non Reactive (09/10 1047)   Baby Food                                GBS:   Contraception  Pap: 05/06/2015 NIL HPV negative  CBB     CS/VBAC Repeat 12/10/2018   Support Person            Obesity affecting pregnancy 05/22/2018 by Will Bonnet, MD No   BMI 37.0-37.9, adult 05/22/2018 by Will Bonnet, MD No   Chronic hypertension affecting pregnancy 05/15/2018 by Gae Dry, MD No   Overview Addendum 10/30/2018  4:45 PM by Malachy Mood, MD    [X]  Aspirin 81 mg daily after 12 weeks; discontinue after 36 weeks [X]  baseline labs with CBC, CMP, urine protein/creatinine  ratio [ ]  no BP meds unless BPs become elevated [ ]  ultrasound for growth at 29 1322 g ( 2 lb 15 oz ) 52.2%ile, 33 2279 g  ( 5 lb 0 oz ) 44.7%ile, 37 weeks   Current antihypertensives:  Labetalol and Methyldopa   Baseline and surveillance labs (pulled in from [facility], refresh links as needed)  Lab Results  Component Value Date   PLT 244 09/12/2018   CREATININE 0.69 05/01/2018   AST 9 05/01/2018   ALT 11 05/01/2018   PROTCRRATIO        11/29/2015    Antenatal Testing CHTN - O10.919  Group I  BP < 140/90, no preeclampsia, AGA,  nml AFV, +/- meds    Group II BP > 140/90, on meds, no preeclampsia, AGA, nml AFV  20-28-34-38  20-24-28-32-35-38  32//2 x wk  28//BPP wkly then 32//2 x wk  40 no meds; 39 meds  PRN or 37  Pre-eclampsia  GHTN - O13.9/Preeclampsia without severe features  - O14.00   Preeclampsia with severe features - O14.10  Q 3-4wks  Q 2 wks  28//BPP wkly then 32//2 x wk  Inpatient  37  PRN or 34        History of cesarean delivery, currently pregnant 04/17/2018 by Gae Dry, MD No       Gestational age appropriate obstetric precautions including but not limited to vaginal bleeding, contractions, leaking of fluid and fetal movement were reviewed in detail with the patient.    - worsening back pain start out of work at 36 weeks -  reactive NST, normal AFI today  Return in about 1 week (around 11/14/2018) for ROB/NST/AFI.  Vena Austria, MD, [facility], [facility]

## 2018-11-07 NOTE — Progress Notes (Signed)
Pt having a lot of pelvic pressure, esp at work. No vb. No lof. Also having issues with hemorrhoids

## 2018-11-13 ENCOUNTER — Ambulatory Visit (INDEPENDENT_AMBULATORY_CARE_PROVIDER_SITE_OTHER): Payer: No Typology Code available for payment source | Admitting: Obstetrics and Gynecology

## 2018-11-13 ENCOUNTER — Ambulatory Visit (INDEPENDENT_AMBULATORY_CARE_PROVIDER_SITE_OTHER): Payer: No Typology Code available for payment source

## 2018-11-13 ENCOUNTER — Other Ambulatory Visit: Payer: Self-pay

## 2018-11-13 VITALS — BP 129/65 | Wt 224.0 lb

## 2018-11-13 DIAGNOSIS — Z3A35 35 weeks gestation of pregnancy: Secondary | ICD-10-CM | POA: Diagnosis not present

## 2018-11-13 DIAGNOSIS — O99211 Obesity complicating pregnancy, first trimester: Secondary | ICD-10-CM

## 2018-11-13 DIAGNOSIS — O10919 Unspecified pre-existing hypertension complicating pregnancy, unspecified trimester: Secondary | ICD-10-CM

## 2018-11-13 DIAGNOSIS — O34219 Maternal care for unspecified type scar from previous cesarean delivery: Secondary | ICD-10-CM

## 2018-11-13 DIAGNOSIS — O0993 Supervision of high risk pregnancy, unspecified, third trimester: Secondary | ICD-10-CM

## 2018-11-13 DIAGNOSIS — Z3689 Encounter for other specified antenatal screening: Secondary | ICD-10-CM | POA: Diagnosis not present

## 2018-11-13 DIAGNOSIS — O0990 Supervision of high risk pregnancy, unspecified, unspecified trimester: Secondary | ICD-10-CM

## 2018-11-13 DIAGNOSIS — O10913 Unspecified pre-existing hypertension complicating pregnancy, third trimester: Secondary | ICD-10-CM | POA: Diagnosis not present

## 2018-11-13 DIAGNOSIS — O163 Unspecified maternal hypertension, third trimester: Secondary | ICD-10-CM

## 2018-11-13 DIAGNOSIS — O99213 Obesity complicating pregnancy, third trimester: Secondary | ICD-10-CM

## 2018-11-13 DIAGNOSIS — Z3685 Encounter for antenatal screening for Streptococcus B: Secondary | ICD-10-CM

## 2018-11-13 LAB — POCT URINALYSIS DIPSTICK OB
Glucose, UA: NEGATIVE
POC,PROTEIN,UA: NEGATIVE

## 2018-11-13 NOTE — Progress Notes (Signed)
Routine Prenatal Care Visit  Subjective  Lacey Rodgers is a 38 y.o. G4P1021 at 45w5dbeing seen today for ongoing prenatal care.  She is currently monitored for the following issues for this high-risk pregnancy and has GENITAL HERPES; COMMON MIGRAINE; Essential hypertension; GERD; Migraine aura, persistent; Moderate recurrent major depression ([place]; Tendinitis of left rotator cuff; Skin rash; Infected pierced ear; Cough; History of cesarean delivery, currently pregnant; Chronic hypertension affecting pregnancy; Supervision of high risk pregnancy, antepartum; Obesity affecting pregnancy; and BMI 37.0-37.9, adult on their problem list.  ----------------------------------------------------------------------------------- Patient reports no complaints.   Contractions: Irregular. Vag. Bleeding: None.  Movement: Present. Denies leaking of fluid.  ----------------------------------------------------------------------------------- The following portions of the patient's history were reviewed and updated as appropriate: allergies, current medications, past family history, past medical history, past social history, past surgical history and problem list. Problem list updated.   Objective  Blood pressure 129/65, weight 224 lb (101.6 kg), last menstrual period 03/08/2018, unknown if currently breastfeeding. Pregravid weight 192 lb (87.1 kg) Total Weight Gain 32 lb (14.5 kg) Urinalysis:      Fetal Status: Fetal Heart Rate (bpm): 140 Fundal Height: 36 cm Movement: Present  Presentation: Vertex  General:  Alert, oriented and cooperative. Patient is in no acute distress.  Skin: Skin is warm and dry. No rash noted.   Cardiovascular: Normal heart rate noted  Respiratory: Normal respiratory effort, no problems with respiration noted  Abdomen: Soft, gravid, appropriate for gestational age. Pain/Pressure: Present     Pelvic:  Cervical exam performed Dilation: Closed Effacement (%): 0 Station: -3    Extremities: Normal range of motion.     ental Status: Normal mood and affect. Normal behavior. Normal judgment and thought content.   [place]Ob Limited  Result Date: 11/13/2018 Patient Name: SOluwasemilore PascuzziDOB: [birth year]MRN: [id number] [facility] Date of Service: 11/13/2018 Indications:AFI Findings: SNelda Marseilleintrauterine pregnancy is visualized with FHR at 160 BPM. Fetal presentation is Cephalic. Placenta: anterior. Grade: 1 AFI: 16.8 cm Impression: 1. 3101w5diable Singleton Intrauterine pregnancy dated by previously established criteria. 2. AFI is 16.8 cm. Recommendations: 1.Clinical correlation with the patient's History and Physical Exam. ElGweneth DimitriRT There is a singleton gestation with normal amniotic fluid volume. The visualized fetal anatomical survey appears within normal limits within the resolution of ultrasound as described above.  It must be noted that a normal ultrasound is unable to rule out fetal aneuploidy.  AnMalachy MoodMD, FALoura PardonB/GYN, [facility] 11/13/2018, 3:00 PM  Us[place] Limited  Result Date: 11/07/2018 Patient Name: StRue ValladaresOB: 2/28-Dec-1982RN: [id number] REPORT [facility] Date of Service: 11/07/2018 Indications:AFI Findings: SiNelda Marseillentrauterine pregnancy is visualized with FHR at 142 BPM. Fetal presentation is Cephalic. Placenta: anterior. Grade: 1 AFI: 20.4 cm Impression: 1. 3465w6dable Singleton Intrauterine pregnancy dated by previously established criteria. 2. AFI is 20.4 cm. Recommendations: 1.Clinical correlation with the patient's History and Physical Exam. ElyGweneth DimitriT There is a singleton gestation with normal amniotic fluid volume. The visualized fetal anatomical survey appears within normal limits within the resolution of ultrasound as described above.  It must be noted that a normal ultrasound is unable to rule out fetal aneuploidy.  AndMalachy MoodD,  FACLoura Pardon/GYN, [facility] 11/07/2018, 9:22 AM   Us [place] Follow Up  Result Date: 10/30/2018 Patient Name: SteRilya LongoB: 2/1Jan 15, 1982N: [id number] REPORT [facility] Date of Service: 10/30/2018 Indications:growth/afi Findings: SinNelda Marseille  intrauterine pregnancy is visualized with FHR at 124 BPM. Biometrics give an (U/S) Gestational age of 49w4dand an (U/S) EDD of 12/14/2018; this correlates with the clinically established Estimated Date of Delivery: 12/13/18. Fetal presentation is Cephalic. Placenta: anterior. Grade: 1 AFI: 14.6 cm Growth percentile is 46.4%. EFW: 2279 g  ( 5 lb 0 oz ) Impression: 1. 346w5diable Singleton Intrauterine pregnancy previously established criteria. 2. Growth is 46.4 %ile.  AFI is 14.6 cm. Recommendations: 1.Clinical correlation with the patient's History and Physical Exam. ElGweneth DimitriRT There is a singleton gestation with normal amniotic fluid volume. The fetal biometry correlates with established dating.  Limited fetal anatomy was performed.The visualized fetal anatomical survey appears within normal limits within the resolution of ultrasound as described above.  It must be noted that a normal ultrasound is unable to rule out fetal aneuploidy.  AnMalachy MoodMD, FADonnellyB/GYN, [facility] 10/30/2018, 4:45 PM   Baseline: 140 Variability: moderate Accelerations: present Decelerations: absent The patient was monitored for 30 minutes, fetal heart rate tracing was deemed reactive, category I tracing,  Immunization History  Administered Date(s) Administered   Influenza,inj,Quad PF,6+ Mos 11/12/2013, 10/16/2017   MMR 12/19/2015   Tdap 10/28/2015, 10/15/2018     Assessment   [age].o. [id number] 3541w5d  12/13/2018, by Last Menstrual Period presenting for routine prenatal visit  Plan   pregnancy4 Problems (from 03/08/18 to present)    Problem Noted Resolved   Supervision of high  risk pregnancy, antepartum 05/22/2018 by JacWill BonnetD No   Overview Addendum 11/07/2018  9:45 AM by StaMalachy MoodD    Clinic [facility] Prenatal Labs  Dating LMP = 7 week [place] type: O/Positive/-- (04/29 0935)   Genetic Screen NIPS: Normal XY Antibody:Negative (04/29 0935)  Anatomic [place]al, anterior placenta Rubella: 1.92 (04/29 0935) Varicella: Immune  GTT Early: 100 Third trimester: 116 RPR: Non Reactive (09/10 1047)   Rhogam N/A HBsAg: Negative (04/29 0935)   TDaP vaccine 10/15/2018  Flu Shot: 10/17/2018 HIV: Non Reactive (09/10 1047)   Baby Food                                GBS:   Contraception Tubal ligation Pap: 05/06/2015 NIL HPV negative  CBB     CS/VBAC Repeat 12/10/2018   Support Person            Obesity affecting pregnancy 05/22/2018 by JacWill BonnetD No   BMI 37.0-37.9, adult 05/22/2018 by JacWill BonnetD No   Chronic hypertension affecting pregnancy 05/15/2018 by HarGae DryD No   Overview Addendum 10/30/2018  4:45 PM by StaMalachy MoodD    [X] Aspirin 81 mg daily after 12 weeks; discontinue after 36 weeks [X] baseline labs with CBC, CMP, urine protein/creatinine ratio [ ] no BP meds unless BPs become elevated [ ] ultrasound for growth at 29 1322 g ( 2 lb 15 oz ) 52.2%ile, 33 2279 g  ( 5 lb 0 oz ) 44.7%ile, 37 weeks   Current antihypertensives:  Labetalol and Methyldopa   Baseline and surveillance labs ([facility])  Lab Results  Component Value Date   PLT 244 09/12/2018   CREATININE 0.69 05/01/2018   AST 9 05/01/2018   ALT 11 05/01/2018   PROTCRRATIO        11/29/2015    Antenatal Testing CHTN -  O10.919  Group I  BP < 140/90, no preeclampsia, AGA,  nml AFV, +/- meds    Group II BP > 140/90, on meds, no preeclampsia, AGA, nml AFV  20-28-34-38  20-24-28-32-35-38  32//2 x wk  28//BPP wkly then 32//2 x wk  40 no meds; 39 meds  PRN or 37  Pre-eclampsia  GHTN -  O13.9/Preeclampsia without severe features  - O14.00   Preeclampsia with severe features - O14.10  Q 3-4wks  Q 2 wks  28//BPP wkly then 32//2 x wk  Inpatient  37  PRN or 34        History of cesarean delivery, currently pregnant 04/17/2018 by Gae Dry, MD No       Gestational age appropriate obstetric precautions including but not limited to vaginal bleeding, contractions, leaking of fluid and fetal movement were reviewed in detail with the patient.    - NST/AFI next vist, growth sacn in 2 weeks - GBS collected  Return in about 1 week (around 11/20/2018) for 1 week ROB, NST/AFI, 2 weeks ROB NST/growth scan.  Malachy Mood, MD, [facility], [facility] 11/13/2018, 3:21 PM

## 2018-11-13 NOTE — Progress Notes (Signed)
ROB AFI/NST GBS

## 2018-11-15 LAB — STREP GP B NAA: Strep Gp B NAA: NEGATIVE

## 2018-11-18 ENCOUNTER — Telehealth: Payer: Self-pay

## 2018-11-18 NOTE — Telephone Encounter (Signed)
FMLA/DISABILITY form for [employer] filled out, signature obtained, and given to KT for processing.

## 2018-11-20 ENCOUNTER — Ambulatory Visit (INDEPENDENT_AMBULATORY_CARE_PROVIDER_SITE_OTHER): Payer: No Typology Code available for payment source | Admitting: Advanced Practice Midwife

## 2018-11-20 ENCOUNTER — Encounter: Payer: Self-pay | Admitting: Advanced Practice Midwife

## 2018-11-20 ENCOUNTER — Other Ambulatory Visit: Payer: Self-pay

## 2018-11-20 ENCOUNTER — Ambulatory Visit (INDEPENDENT_AMBULATORY_CARE_PROVIDER_SITE_OTHER): Payer: No Typology Code available for payment source

## 2018-11-20 VITALS — BP 110/80 | Wt 222.0 lb

## 2018-11-20 DIAGNOSIS — O0990 Supervision of high risk pregnancy, unspecified, unspecified trimester: Secondary | ICD-10-CM

## 2018-11-20 DIAGNOSIS — O10913 Unspecified pre-existing hypertension complicating pregnancy, third trimester: Secondary | ICD-10-CM

## 2018-11-20 DIAGNOSIS — O0993 Supervision of high risk pregnancy, unspecified, third trimester: Secondary | ICD-10-CM

## 2018-11-20 DIAGNOSIS — Z3689 Encounter for other specified antenatal screening: Secondary | ICD-10-CM | POA: Diagnosis not present

## 2018-11-20 DIAGNOSIS — O10919 Unspecified pre-existing hypertension complicating pregnancy, unspecified trimester: Secondary | ICD-10-CM

## 2018-11-20 DIAGNOSIS — O99211 Obesity complicating pregnancy, first trimester: Secondary | ICD-10-CM

## 2018-11-20 DIAGNOSIS — Z3A36 36 weeks gestation of pregnancy: Secondary | ICD-10-CM | POA: Diagnosis not present

## 2018-11-20 DIAGNOSIS — O34219 Maternal care for unspecified type scar from previous cesarean delivery: Secondary | ICD-10-CM

## 2018-11-20 NOTE — Progress Notes (Signed)
Routine Prenatal Care Visit  Subjective  Lacey Rodgers is a 38 y.o. G4P1021 at [redacted]w[redacted]d being seen today for ongoing prenatal care.  She is currently monitored for the following issues for this high-risk pregnancy and has GENITAL HERPES; COMMON MIGRAINE; Essential hypertension; GERD; Migraine aura, persistent; Moderate recurrent major depression (HCC); Tendinitis of left rotator cuff; Skin rash; Infected pierced ear; Cough; History of cesarean delivery, currently pregnant; Chronic hypertension affecting pregnancy; Supervision of high risk pregnancy, antepartum; Obesity affecting pregnancy; and BMI 37.0-37.9, adult on their problem list.  ----------------------------------------------------------------------------------- Patient reports no complaints.   Contractions: Irregular. Vag. Bleeding: None.  Movement: Present. Leaking Fluid denies.  ----------------------------------------------------------------------------------- The following portions of the patient's history were reviewed and updated as appropriate: allergies, current medications, past family history, past medical history, past social history, past surgical history and problem list. Problem list updated.  Objective  Blood pressure 110/80, weight 222 lb (100.7 kg), last menstrual period 03/08/2018, unknown if currently breastfeeding. Pregravid weight 192 lb (87.1 kg) Total Weight Gain 30 lb (13.6 kg) Urinalysis: Urine Protein    Urine Glucose    Fetal Status: Fetal Heart Rate (bpm): 140   Movement: Present  Presentation: Cephalic  NST: reactive 25 minute tracing, 140 bpm baseline, moderate variability, +accelerations to 170, -decelerations AFI: 11.5 cm  General:  Alert, oriented and cooperative. Patient is in no acute distress.  Skin: Skin is warm and dry. No rash noted.   Cardiovascular: Normal heart rate noted  Respiratory: Normal respiratory effort, no problems with respiration noted  Abdomen: Soft, gravid, appropriate for  gestational age. Pain/Pressure: Present     Pelvic:  Cervical exam deferred        Extremities: Normal range of motion.  Edema: None  Mental Status: Normal mood and affect. Normal behavior. Normal judgment and thought content.   Assessment   38 y.o. G4W1027 at [redacted]w[redacted]d by  12/13/2018, by Last Menstrual Period presenting for routine prenatal visit  Plan   pregnancy4 Problems (from 03/08/18 to present)    Problem Noted Resolved   Supervision of high risk pregnancy, antepartum 05/22/2018 by Conard Novak, MD No   Overview Addendum 11/18/2018  8:15 AM by Vena Austria, MD    Clinic [facility] Prenatal Labs  Dating LMP = 7 week [place] Blood type: O/Positive/-- (04/29 0935)   Genetic Screen NIPS: Normal XY Antibody:Negative (04/29 0935)  Anatomic [place] Normal, anterior placenta Rubella: 1.92 (04/29 0935) Varicella: Immune  GTT Early: 100 Third trimester: 116 RPR: Non Reactive (09/10 1047)   Rhogam N/A HBsAg: Negative (04/29 0935)   TDaP vaccine 10/15/2018  Flu Shot: 10/17/2018 HIV: Non Reactive (09/10 1047)   Baby Food                                [id number]  Contraception Tubal ligation Pap: 05/06/2015 NIL HPV negative  CBB     CS/VBAC Repeat 12/10/2018   Support Person            Obesity affecting pregnancy 05/22/2018 by Conard Novak, MD No   BMI 37.0-37.9, adult 05/22/2018 by Conard Novak, MD No   Chronic hypertension affecting pregnancy 05/15/2018 by Nadara Mustard, MD No   Overview Addendum 10/30/2018  4:45 PM by Vena Austria, MD    [X]  Aspirin 81 mg daily after 12 weeks; discontinue after 36 weeks [X]  baseline labs with CBC, CMP, urine protein/creatinine ratio [ ]  no BP meds unless BPs become elevated [ ]   ultrasound for growth at 29 1322 g ( 2 lb 15 oz ) 52.2%ile, 33 2279 g  ( 5 lb 0 oz ) 44.7%ile, 37 weeks   Current antihypertensives:  Labetalol and Methyldopa   Baseline and surveillance labs (pulled in from [facility], refresh links as needed)  Lab Results   Component Value Date   PLT 244 09/12/2018   CREATININE 0.69 05/01/2018   AST 9 05/01/2018   ALT 11 05/01/2018   PROTCRRATIO        11/29/2015    Antenatal Testing CHTN - O10.919  Group I  BP < 140/90, no preeclampsia, AGA,  nml AFV, +/- meds    Group II BP > 140/90, on meds, no preeclampsia, AGA, nml AFV  20-28-34-38  20-24-28-32-35-38  32//2 x wk  28//BPP wkly then 32//2 x wk  40 no meds; 39 meds  PRN or 37  Pre-eclampsia  GHTN - O13.9/Preeclampsia without severe features  - O14.00   Preeclampsia with severe features - O14.10  Q 3-4wks  Q 2 wks  28//BPP wkly then 32//2 x wk  Inpatient  37  PRN or 34        History of cesarean delivery, currently pregnant 04/17/2018 by Gae Dry, MD No       Term labor symptoms and general obstetric precautions including but not limited to vaginal bleeding, contractions, leaking of fluid and fetal movement were reviewed in detail with the patient.   Repeat c/section and BTL scheduled for 12/10/18  Return for please change 11/30 appt to next week instead.  Rod Can, CNM 11/20/2018 10:28 AM

## 2018-11-26 ENCOUNTER — Other Ambulatory Visit: Payer: Self-pay

## 2018-11-26 ENCOUNTER — Ambulatory Visit (INDEPENDENT_AMBULATORY_CARE_PROVIDER_SITE_OTHER): Payer: No Typology Code available for payment source | Admitting: Obstetrics and Gynecology

## 2018-11-26 ENCOUNTER — Ambulatory Visit (INDEPENDENT_AMBULATORY_CARE_PROVIDER_SITE_OTHER): Payer: No Typology Code available for payment source

## 2018-11-26 ENCOUNTER — Encounter: Payer: Self-pay | Admitting: Obstetrics and Gynecology

## 2018-11-26 ENCOUNTER — Telehealth: Payer: Self-pay

## 2018-11-26 VITALS — BP 130/80 | Wt 222.0 lb

## 2018-11-26 DIAGNOSIS — O10913 Unspecified pre-existing hypertension complicating pregnancy, third trimester: Secondary | ICD-10-CM

## 2018-11-26 DIAGNOSIS — O0990 Supervision of high risk pregnancy, unspecified, unspecified trimester: Secondary | ICD-10-CM

## 2018-11-26 DIAGNOSIS — O10919 Unspecified pre-existing hypertension complicating pregnancy, unspecified trimester: Secondary | ICD-10-CM

## 2018-11-26 DIAGNOSIS — O34219 Maternal care for unspecified type scar from previous cesarean delivery: Secondary | ICD-10-CM

## 2018-11-26 DIAGNOSIS — O99210 Obesity complicating pregnancy, unspecified trimester: Secondary | ICD-10-CM

## 2018-11-26 DIAGNOSIS — Z3A37 37 weeks gestation of pregnancy: Secondary | ICD-10-CM

## 2018-11-26 DIAGNOSIS — O99211 Obesity complicating pregnancy, first trimester: Secondary | ICD-10-CM

## 2018-11-26 DIAGNOSIS — Z3689 Encounter for other specified antenatal screening: Secondary | ICD-10-CM

## 2018-11-26 DIAGNOSIS — O99213 Obesity complicating pregnancy, third trimester: Secondary | ICD-10-CM

## 2018-11-26 DIAGNOSIS — F418 Other specified anxiety disorders: Secondary | ICD-10-CM

## 2018-11-26 MED ORDER — VALACYCLOVIR HCL 500 MG PO TABS: 500.0000 mg | ORAL_TABLET | Freq: Two times a day (BID) | ORAL | 5 refills | Status: DC

## 2018-11-26 MED ORDER — LABETALOL HCL 100 MG PO TABS: 100.0000 mg | ORAL_TABLET | Freq: Two times a day (BID) | ORAL | 6 refills | Status: DC

## 2018-11-26 MED ORDER — ESCITALOPRAM OXALATE 10 MG PO TABS: 10.0000 mg | ORAL_TABLET | Freq: Every day | ORAL | 3 refills | Status: DC

## 2018-11-26 NOTE — Progress Notes (Signed)
ROB/NST No concerns Denies lof, no vb, Good FM 

## 2018-11-26 NOTE — Progress Notes (Signed)
Routine Prenatal Care Visit  Subjective  Lacey Rodgers is a 38 y.o. G4P1021 at [redacted]w[redacted]d being seen today for ongoing prenatal care.  She is currently monitored for the following issues for this high-risk pregnancy and has GENITAL HERPES; COMMON MIGRAINE; Essential hypertension; GERD; Migraine aura, persistent; Moderate recurrent major depression (HCC); Tendinitis of left rotator cuff; Skin rash; Infected pierced ear; Cough; History of cesarean delivery, currently pregnant; Chronic hypertension affecting pregnancy; Supervision of high risk pregnancy, antepartum; Obesity affecting pregnancy; and BMI 37.0-37.9, adult on their problem list.  ----------------------------------------------------------------------------------- Patient reports depressed mood, she would like to start an antidepressant. She previously struggled with postpartum depression.   Contractions: Not present. Vag. Bleeding: None.  Movement: Present. Denies leaking of fluid.  ----------------------------------------------------------------------------------- The following portions of the patient's history were reviewed and updated as appropriate: allergies, current medications, past family history, past medical history, past social history, past surgical history and problem list. Problem list updated.   Objective  Blood pressure 130/80, weight 222 lb (100.7 kg), last menstrual period 03/08/2018, unknown if currently breastfeeding. Pregravid weight 192 lb (87.1 kg) Total Weight Gain 30 lb (13.6 kg) Urinalysis:      Fetal Status: Fetal Heart Rate (bpm): 135 Fundal Height: 40 cm Movement: Present     General:  Alert, oriented and cooperative. Patient is in no acute distress.  Skin: Skin is warm and dry. No rash noted.   Cardiovascular: Normal heart rate noted  Respiratory: Normal respiratory effort, no problems with respiration noted  Abdomen: Soft, gravid, appropriate for gestational age. Pain/Pressure: Present     Pelvic:   Cervical exam deferred        Extremities: Normal range of motion.  Edema: None  Mental Status: Normal mood and affect. Normal behavior. Normal judgment and thought content.     Assessment   38 y.o. Q7Y1950 at [redacted]w[redacted]d by  12/13/2018, by Last Menstrual Period presenting for routine prenatal visit  Plan   pregnancy4 Problems (from 03/08/18 to present)    Problem Noted Resolved   Supervision of high risk pregnancy, antepartum 05/22/2018 by Conard Novak, MD No   Overview Addendum 11/18/2018  8:15 AM by Vena Austria, MD    [facility] Prenatal Labs  Dating LMP = 7 week [place] Blood type: O/Positive/-- (04/29 0935)   Genetic Screen NIPS: Normal XY Antibody:Negative (04/29 0935)  Anatomic [place] Normal, anterior placenta Rubella: 1.92 (04/29 0935) Varicella: Immune  GTT Early: 100 Third trimester: 116 RPR: Non Reactive (09/10 1047)   Rhogam N/A HBsAg: Negative (04/29 0935)   TDaP vaccine 10/15/2018  Flu Shot: 10/17/2018 HIV: Non Reactive (09/10 1047)   Baby Food                                DTO:[id number]  Contraception Tubal ligation Pap: 05/06/2015 NIL HPV negative  CBB     CS/VBAC Repeat 12/10/2018   Support Person            Obesity affecting pregnancy 05/22/2018 by Conard Novak, MD No   BMI 37.0-37.9, adult 05/22/2018 by Conard Novak, MD No   Chronic hypertension affecting pregnancy 05/15/2018 by Nadara Mustard, MD No   Overview Addendum 10/30/2018  4:45 PM by Vena Austria, MD    [X]  Aspirin 81 mg daily after 12 weeks; discontinue after 36 weeks [X]  baseline labs with CBC, CMP, urine protein/creatinine ratio [ ]  no BP meds unless BPs become elevated [ ]   ultrasound for growth at 29 1322 g ( 2 lb 15 oz ) 52.2%ile, 33 2279 g  ( 5 lb 0 oz ) 44.7%ile, 37 weeks   Current antihypertensives:  Labetalol and Methyldopa   Baseline and surveillance labs (pulled in from [facility], refresh links as needed)  Lab Results  Component Value Date   PLT 244 09/12/2018    CREATININE 0.69 05/01/2018   AST 9 05/01/2018   ALT 11 05/01/2018   PROTCRRATIO        11/29/2015    Antenatal Testing CHTN - O10.919  Group I  BP < 140/90, no preeclampsia, AGA,  nml AFV, +/- meds    Group II BP > 140/90, on meds, no preeclampsia, AGA, nml AFV  20-28-34-38  20-24-28-32-35-38  32//2 x wk  28//BPP wkly then 32//2 x wk  40 no meds; 39 meds  PRN or 37  Pre-eclampsia  GHTN - O13.9/Preeclampsia without severe features  - O14.00   Preeclampsia with severe features - O14.10  Q 3-4wks  Q 2 wks  28//BPP wkly then 32//2 x wk  Inpatient  38  PRN or 34        History of cesarean delivery, currently pregnant 04/17/2018 by Gae Dry, MD No       Gestational age appropriate obstetric precautions including but not limited to vaginal bleeding, contractions, leaking of fluid and fetal movement were reviewed in detail with the patient.    Start valtrex Refill labetalol Start Perry was 18 Referred to therapist/counselors Declines psychiatry referral Given work note for husband.   Return in about 1 week (around 12/03/2018) for ROB in person US/NST.  Homero Fellers MD [facility] 11/26/2018, 11:59 AM

## 2018-11-26 NOTE — Patient Instructions (Addendum)
[facility] [place], [employer] [place] Same Day Access Hours:  Monday, Wednesday and Friday, 8am - 3pm Walk-In Crisis Hours: 7 days/wk, 8am - 8pm [address], [place], [place] [address] [phone]  [employer] [phone]  Mobile Crisis Unit [phone]   Therapists/Counselors/Psychologists   Karen [place], [place]  & Jacqlyn Krauss [place]    [phone]        [address]       [place], [place] [address]        Ival Bible, CSW [phone] [address] [place], [place] [address]  Harle Battiest, [place]        [phone]        [address]      [place], [place] [address]        Chyrel Masson, [place] [phone] [address] [place], [place] [address]   Oscar La, LMFT       [phone]        [address]       [place], [place] [address]        Felecia Jan [phone] [address] [place], [place] [address]  Lester [place]        [phone]        [address]       [place], [place] [address]        Kerin Salen [phone] [address] [place], [place] [address]  Tyron Russell, PsyD       [phone]        [address]       [place], [place] [address]        Elita Quick, LPC [phone] [address]  [place], [place] [address]   Debarah Crape        [phone]        [address] [place], [place] [address]         Rosana Hoes [facility] [phone] [email or web address]   Sation Konchella       [phone]        [address]       [place], [place] [address]        [facility] [phone] [email or web address]      Perinatal Depression When a woman feels excessive sadness, anger, or anxiety during pregnancy or during the first 12 months after she gives birth, she has a condition called perinatal depression. Depression can interfere with work, school, relationships, and other  everyday activities. If it is not managed properly, it can also cause problems in the mother and her baby. Sometimes, perinatal depression is left untreated because symptoms are thought to be normal mood swings during and right after pregnancy. If you have symptoms of depression, it is important to talk with your health care provider. What are the causes? The exact cause of this condition is not known. Hormonal changes during and after pregnancy may play a role in causing perinatal depression. What increases the risk? You are more likely to develop this condition if:  You have a personal or family history of depression, anxiety, or mood disorders.  You experience a stressful life event during pregnancy, such as the death of a loved one.  You have a lot of regular life stress.  You do not have support from family members or loved ones, or you are in an abusive relationship. What are the  signs or symptoms? Symptoms of this condition include:  Feeling sad or hopeless.  Feelings of guilt.  Feeling irritable or overwhelmed.  Changes in your appetite.  Lack of energy or motivation.  Sleep problems.  Difficulty concentrating or completing tasks.  Loss of interest in hobbies or relationships.  Headaches or stomach problems that do not go away. How is this diagnosed? This condition is diagnosed based on a physical exam and mental evaluation. In some cases, your health care provider may use a depression screening tool. These tools include a list of questions that can help a health care provider diagnose depression. Your health care provider may refer you to a mental health expert who specializes in depression. How is this treated? This condition may be treated with:  Medicines. Your health care provider will only give you medicines that have been proven safe for pregnancy and breastfeeding.  Talk therapy with a mental health professional to help change your patterns of thinking  (cognitive behavioral therapy).  Support groups.  Brain stimulation or light therapies.  Stress reduction therapies, such as mindfulness. Follow these instructions at home: Lifestyle  Do not use any products that contain nicotine or tobacco, such as cigarettes and e-cigarettes. If you need help quitting, ask your health care provider.  Do not use alcohol when you are pregnant. After your baby is born, limit alcohol intake to no more than 1 drink a day. One drink equals 12 oz of beer, 5 oz of wine, or 1 oz of hard liquor.  Consider joining a support group for new mothers. Ask your health care provider for recommendations.  Take good care of yourself. Make sure you: ? Get plenty of sleep. If you are having trouble sleeping, talk with your health care provider. ? Eat a healthy diet. This includes plenty of fruits and vegetables, whole grains, and lean proteins. ? Exercise regularly, as told by your health care provider. Ask your health care provider what exercises are safe for you. General instructions  Take over-the-counter and prescription medicines only as told by your health care provider.  Talk with your partner or family members about your feelings during pregnancy. Share any concerns or anxieties that you may have.  Ask for help with tasks or chores when you need it. Ask friends and family members to provide meals, watch your children, or help with cleaning.  Keep all follow-up visits as told by your health care provider. This is important. Contact a health care provider if:  You (or people close to you) notice that you have any symptoms of depression.  You have depression and your symptoms get worse.  You experience side effects from medicines, such as nausea or sleep problems. Get help right away if:  You feel like hurting yourself, your baby, or someone else. If you ever feel like you may hurt yourself or others, or have thoughts about taking your own life, get help  right away. You can go to your nearest emergency department or call:  Your local emergency services (911 in the [place]  A suicide crisis helpline, such as the National Suicide Prevention Lifeline at 914-488-03461-(916)837-9861. This is open 24 hours a day. Summary  Perinatal depression is when a woman feels excessive sadness, anger, or anxiety during pregnancy or during the first 12 months after she gives birth.  If perinatal depression is not treated, it can lead to health problems for the mother and her baby.  This condition is treated with medicines, talk therapy, stress reduction therapies,  or a combination of two or more treatments.  Talk with your partner or family members about your feelings. Do not be afraid to ask for help. This information is not intended to replace advice given to you by your health care provider. Make sure you discuss any questions you have with your health care provider. Document Released: 02/16/2016 Document Revised: 12/22/2016 Document Reviewed: 02/16/2016 Elsevier Patient Education  2020 [employer].    Third Trimester of Pregnancy The third trimester is from week 28 through week 40 (months 7 through 9). The third trimester is a time when the unborn baby (fetus) is growing rapidly. At the end of the ninth month, the fetus is about 20 inches in length and weighs 6-10 pounds. Body changes during your third trimester Your body will continue to go through many changes during pregnancy. The changes vary from woman to woman. During the third trimester:  Your weight will continue to increase. You can expect to gain 25-35 pounds (11-16 kg) by the end of the pregnancy.  You may begin to get stretch marks on your hips, abdomen, and breasts.  You may urinate more often because the fetus is moving lower into your pelvis and pressing on your bladder.  You may develop or continue to have heartburn. This is caused by increased hormones that slow down muscles in the digestive  tract.  You may develop or continue to have constipation because increased hormones slow digestion and cause the muscles that push waste through your intestines to relax.  You may develop hemorrhoids. These are swollen veins (varicose veins) in the rectum that can itch or be painful.  You may develop swollen, bulging veins (varicose veins) in your legs.  You may have increased body aches in the pelvis, back, or thighs. This is due to weight gain and increased hormones that are relaxing your joints.  You may have changes in your hair. These can include thickening of your hair, rapid growth, and changes in texture. Some women also have hair loss during or after pregnancy, or hair that feels dry or thin. Your hair will most likely return to normal after your baby is born.  Your breasts will continue to grow and they will continue to become tender. A yellow fluid (colostrum) may leak from your breasts. This is the first milk you are producing for your baby.  Your belly button may stick out.  You may notice more swelling in your hands, face, or ankles.  You may have increased tingling or numbness in your hands, arms, and legs. The skin on your belly may also feel numb.  You may feel short of breath because of your expanding uterus.  You may have more problems sleeping. This can be caused by the size of your belly, increased need to urinate, and an increase in your body's metabolism.  You may notice the fetus "dropping," or moving lower in your abdomen (lightening).  You may have increased vaginal discharge.  You may notice your joints feel loose and you may have pain around your pelvic bone. What to expect at prenatal visits You will have prenatal exams every 2 weeks until week 36. Then you will have weekly prenatal exams. During a routine prenatal visit:  You will be weighed to make sure you and the baby are growing normally.  Your blood pressure will be taken.  Your abdomen will be  measured to track your baby's growth.  The fetal heartbeat will be listened to.  Any test results from  the previous visit will be discussed.  You may have a cervical check near your due date to see if your cervix has softened or thinned (effaced).  You will be tested for Group B streptococcus. This happens between 35 and 37 weeks. Your health care provider may ask you:  What your birth plan is.  How you are feeling.  If you are feeling the baby move.  If you have had any abnormal symptoms, such as leaking fluid, bleeding, severe headaches, or abdominal cramping.  If you are using any tobacco products, including cigarettes, chewing tobacco, and electronic cigarettes.  If you have any questions. Other tests or screenings that may be performed during your third trimester include:  Blood tests that check for low iron levels (anemia).  Fetal testing to check the health, activity level, and growth of the fetus. Testing is done if you have certain medical conditions or if there are problems during the pregnancy.  Nonstress test (NST). This test checks the health of your baby to make sure there are no signs of problems, such as the baby not getting enough oxygen. During this test, a belt is placed around your belly. The baby is made to move, and its heart rate is monitored during movement. What is false labor? False labor is a condition in which you feel small, irregular tightenings of the muscles in the womb (contractions) that usually go away with rest, changing position, or drinking water. These are called Braxton Hicks contractions. Contractions may last for hours, days, or even weeks before true labor sets in. If contractions come at regular intervals, become more frequent, increase in intensity, or become painful, you should see your health care provider. What are the signs of labor?  Abdominal cramps.  Regular contractions that start at 10 minutes apart and become stronger and more  frequent with time.  Contractions that start on the top of the uterus and spread down to the lower abdomen and back.  Increased pelvic pressure and dull back pain.  A watery or bloody mucus discharge that comes from the vagina.  Leaking of amniotic fluid. This is also known as your "water breaking." It could be a slow trickle or a gush. Let your health care provider know if it has a color or strange odor. If you have any of these signs, call your health care provider right away, even if it is before your due date. Follow these instructions at home: Medicines  Follow your health care provider's instructions regarding medicine use. Specific medicines may be either safe or unsafe to take during pregnancy.  Take a prenatal vitamin that contains at least 600 micrograms (mcg) of folic acid.  If you develop constipation, try taking a stool softener if your health care provider approves. Eating and drinking   Eat a balanced diet that includes fresh fruits and vegetables, whole grains, good sources of protein such as meat, eggs, or tofu, and low-fat dairy. Your health care provider will help you determine the amount of weight gain that is right for you.  Avoid raw meat and uncooked cheese. These carry germs that can cause birth defects in the baby.  If you have low calcium intake from food, talk to your health care provider about whether you should take a daily calcium supplement.  Eat four or five small meals rather than three large meals a day.  Limit foods that are high in fat and processed sugars, such as fried and sweet foods.  To prevent constipation: ?  Drink enough fluid to keep your urine clear or pale yellow. ? Eat foods that are high in fiber, such as fresh fruits and vegetables, whole grains, and beans. Activity  Exercise only as directed by your health care provider. Most women can continue their usual exercise routine during pregnancy. Try to exercise for 30 minutes at least 5  days a week. Stop exercising if you experience uterine contractions.  Avoid heavy lifting.  Do not exercise in extreme heat or humidity, or at high altitudes.  Wear low-heel, comfortable shoes.  Practice good posture.  You may continue to have sex unless your health care provider tells you otherwise. Relieving pain and discomfort  Take frequent breaks and rest with your legs elevated if you have leg cramps or low back pain.  Take warm sitz baths to soothe any pain or discomfort caused by hemorrhoids. Use hemorrhoid cream if your health care provider approves.  Wear a good support bra to prevent discomfort from breast tenderness.  If you develop varicose veins: ? Wear support pantyhose or compression stockings as told by your healthcare provider. ? Elevate your feet for 15 minutes, 3-4 times a day. Prenatal care  Write down your questions. Take them to your prenatal visits.  Keep all your prenatal visits as told by your health care provider. This is important. Safety  Wear your seat belt at all times when driving.  Make a list of emergency phone numbers, including numbers for family, friends, the hospital, and police and fire departments. General instructions  Avoid cat litter boxes and soil used by cats. These carry germs that can cause birth defects in the baby. If you have a cat, ask someone to clean the litter box for you.  Do not travel far distances unless it is absolutely necessary and only with the approval of your health care provider.  Do not use hot tubs, steam rooms, or saunas.  Do not drink alcohol.  Do not use any products that contain nicotine or tobacco, such as cigarettes and e-cigarettes. If you need help quitting, ask your health care provider.  Do not use any medicinal herbs or unprescribed drugs. These chemicals affect the formation and growth of the baby.  Do not douche or use tampons or scented sanitary pads.  Do not cross your legs for long  periods of time.  To prepare for the arrival of your baby: ? Take prenatal classes to understand, practice, and ask questions about labor and delivery. ? Make a trial run to the hospital. ? Visit the hospital and tour the maternity area. ? Arrange for maternity or paternity leave through employers. ? Arrange for family and friends to take care of pets while you are in the hospital. ? Purchase a rear-facing car seat and make sure you know how to install it in your car. ? Pack your hospital bag. ? Prepare the babys nursery. Make sure to remove all pillows and stuffed animals from the baby's crib to prevent suffocation.  Visit your dentist if you have not gone during your pregnancy. Use a soft toothbrush to brush your teeth and be gentle when you floss. Contact a health care provider if:  You are unsure if you are in labor or if your water has broken.  You become dizzy.  You have mild pelvic cramps, pelvic pressure, or nagging pain in your abdominal area.  You have lower back pain.  You have persistent nausea, vomiting, or diarrhea.  You have an unusual or bad smelling vaginal  discharge.  You have pain when you urinate. Get help right away if:  Your water breaks before 37 weeks.  You have regular contractions less than 5 minutes apart before 37 weeks.  You have a fever.  You are leaking fluid from your vagina.  You have spotting or bleeding from your vagina.  You have severe abdominal pain or cramping.  You have rapid weight loss or weight gain.  You have shortness of breath with chest pain.  You notice sudden or extreme swelling of your face, hands, ankles, feet, or legs.  Your baby makes fewer than 10 movements in 2 hours.  You have severe headaches that do not go away when you take medicine.  You have vision changes. Summary  The third trimester is from week 28 through week 40, months 7 through 9. The third trimester is a time when the unborn baby (fetus) is  growing rapidly.  During the third trimester, your discomfort may increase as you and your baby continue to gain weight. You may have abdominal, leg, and back pain, sleeping problems, and an increased need to urinate.  During the third trimester your breasts will keep growing and they will continue to become tender. A yellow fluid (colostrum) may leak from your breasts. This is the first milk you are producing for your baby.  False labor is a condition in which you feel small, irregular tightenings of the muscles in the womb (contractions) that eventually go away. These are called Braxton Hicks contractions. Contractions may last for hours, days, or even weeks before true labor sets in.  Signs of labor can include: abdominal cramps; regular contractions that start at 10 minutes apart and become stronger and more frequent with time; watery or bloody mucus discharge that comes from the vagina; increased pelvic pressure and dull back pain; and leaking of amniotic fluid. This information is not intended to replace advice given to you by your health care provider. Make sure you discuss any questions you have with your health care provider. Document Released: 12/13/2000 Document Revised: 04/11/2018 Document Reviewed: 01/25/2016 Elsevier Patient Education  2020 [employer].

## 2018-11-26 NOTE — Telephone Encounter (Signed)
FMLA/DISABILITY form for [employer] filled out, signature obtained and given to KT for processing. 

## 2018-12-02 ENCOUNTER — Inpatient Hospital Stay: Payer: No Typology Code available for payment source | Admitting: Certified Registered Nurse Anesthetist

## 2018-12-02 ENCOUNTER — Encounter (HOSPITAL_COMMUNITY): Payer: Self-pay | Admitting: Anesthesiology

## 2018-12-02 ENCOUNTER — Encounter: Payer: No Typology Code available for payment source | Admitting: Obstetrics and Gynecology

## 2018-12-02 ENCOUNTER — Inpatient Hospital Stay
Admission: EM | Admit: 2018-12-02 | Discharge: 2018-12-04 | DRG: 784 | Disposition: A | Discharge status: Home / Self Care | Payer: No Typology Code available for payment source | Attending: Certified Nurse Midwife | Admitting: Certified Nurse Midwife

## 2018-12-02 ENCOUNTER — Other Ambulatory Visit: Payer: No Typology Code available for payment source

## 2018-12-02 ENCOUNTER — Encounter
Admission: EM | Disposition: A | Discharge status: Home / Self Care | Payer: Self-pay | Source: Home / Self Care | Attending: Certified Nurse Midwife

## 2018-12-02 ENCOUNTER — Other Ambulatory Visit: Payer: Self-pay

## 2018-12-02 DIAGNOSIS — Z6837 Body mass index (BMI) 37.0-37.9, adult: Secondary | ICD-10-CM

## 2018-12-02 DIAGNOSIS — O10919 Unspecified pre-existing hypertension complicating pregnancy, unspecified trimester: Secondary | ICD-10-CM

## 2018-12-02 DIAGNOSIS — Z302 Encounter for sterilization: Secondary | ICD-10-CM | POA: Diagnosis not present

## 2018-12-02 DIAGNOSIS — O9081 Anemia of the puerperium: Secondary | ICD-10-CM | POA: Diagnosis not present

## 2018-12-02 DIAGNOSIS — A6000 Herpesviral infection of urogenital system, unspecified: Secondary | ICD-10-CM | POA: Diagnosis present

## 2018-12-02 DIAGNOSIS — O9832 Other infections with a predominantly sexual mode of transmission complicating childbirth: Secondary | ICD-10-CM | POA: Diagnosis present

## 2018-12-02 DIAGNOSIS — Z20828 Contact with and (suspected) exposure to other viral communicable diseases: Secondary | ICD-10-CM | POA: Diagnosis present

## 2018-12-02 DIAGNOSIS — D62 Acute posthemorrhagic anemia: Secondary | ICD-10-CM | POA: Diagnosis not present

## 2018-12-02 DIAGNOSIS — K219 Gastro-esophageal reflux disease without esophagitis: Secondary | ICD-10-CM | POA: Diagnosis present

## 2018-12-02 DIAGNOSIS — O34211 Maternal care for low transverse scar from previous cesarean delivery: Secondary | ICD-10-CM | POA: Diagnosis present

## 2018-12-02 DIAGNOSIS — O34219 Maternal care for unspecified type scar from previous cesarean delivery: Secondary | ICD-10-CM | POA: Diagnosis present

## 2018-12-02 DIAGNOSIS — O0990 Supervision of high risk pregnancy, unspecified, unspecified trimester: Secondary | ICD-10-CM

## 2018-12-02 DIAGNOSIS — F329 Major depressive disorder, single episode, unspecified: Secondary | ICD-10-CM | POA: Diagnosis present

## 2018-12-02 DIAGNOSIS — O99214 Obesity complicating childbirth: Secondary | ICD-10-CM | POA: Diagnosis not present

## 2018-12-02 DIAGNOSIS — O99344 Other mental disorders complicating childbirth: Secondary | ICD-10-CM | POA: Diagnosis not present

## 2018-12-02 DIAGNOSIS — O1002 Pre-existing essential hypertension complicating childbirth: Secondary | ICD-10-CM | POA: Diagnosis present

## 2018-12-02 DIAGNOSIS — O99213 Obesity complicating pregnancy, third trimester: Secondary | ICD-10-CM

## 2018-12-02 DIAGNOSIS — Z3A38 38 weeks gestation of pregnancy: Secondary | ICD-10-CM

## 2018-12-02 DIAGNOSIS — O1092 Unspecified pre-existing hypertension complicating childbirth: Secondary | ICD-10-CM

## 2018-12-02 DIAGNOSIS — O9962 Diseases of the digestive system complicating childbirth: Secondary | ICD-10-CM | POA: Diagnosis present

## 2018-12-02 LAB — COMPREHENSIVE METABOLIC PANEL
ALT: 10 U/L (ref 0–44)
AST: 19 U/L (ref 15–41)
Albumin: 2.6 g/dL — ABNORMAL LOW (ref 3.5–5.0)
Alkaline Phosphatase: 82 U/L (ref 38–126)
Anion gap: 11 (ref 5–15)
BUN: 7 mg/dL (ref 6–20)
CO2: 19 mmol/L — ABNORMAL LOW (ref 22–32)
Calcium: 8.7 mg/dL — ABNORMAL LOW (ref 8.9–10.3)
Chloride: 109 mmol/L (ref 98–111)
Creatinine, Ser: 0.64 mg/dL (ref 0.44–1.00)
GFR calc Af Amer: >60 mL/min (ref >60)
GFR calc non Af Amer: >60 mL/min (ref >60)
Glucose, Bld: 111 mg/dL — ABNORMAL HIGH (ref 70–99)
Potassium: 3.8 mmol/L (ref 3.5–5.1)
Sodium: 139 mmol/L (ref 135–145)
Total Bilirubin: 0.6 mg/dL (ref 0.3–1.2)
Total Protein: 5.8 g/dL — ABNORMAL LOW (ref 6.5–8.1)

## 2018-12-02 LAB — SARS CORONAVIRUS 2 BY RT PCR (HOSPITAL ORDER, PERFORMED IN ~~LOC~~ HOSPITAL LAB): SARS Coronavirus 2: NEGATIVE

## 2018-12-02 LAB — TYPE AND SCREEN
ABO/RH(D): O POS
Antibody Screen: NEGATIVE

## 2018-12-02 LAB — CBC
HCT: 37.4 % (ref 36.0–46.0)
Hemoglobin: 12.4 g/dL (ref 12.0–15.0)
MCH: 29 pg (ref 26.0–34.0)
MCHC: 33.2 g/dL (ref 30.0–36.0)
MCV: 87.6 fL (ref 80.0–100.0)
Platelets: 200 10*3/uL (ref 150–400)
RBC: 4.27 MIL/uL (ref 3.87–5.11)
RDW: 13.2 % (ref 11.5–15.5)
WBC: 7.8 10*3/uL (ref 4.0–10.5)
nRBC: 0 % (ref 0.0–0.2)

## 2018-12-02 SURGERY — Surgical Case
Anesthesia: Spinal

## 2018-12-02 MED ORDER — LABETALOL HCL 100 MG PO TABS
100.0000 mg | ORAL_TABLET | Freq: Two times a day (BID) | ORAL | Status: DC
  Administered 2018-12-03 – 2018-12-04 (×2): 100 mg via ORAL
  Filled 2018-12-02 (×2): qty 1

## 2018-12-02 MED ORDER — HYDRALAZINE HCL 20 MG/ML IJ SOLN: 10.0000 mg | INTRAMUSCULAR | Status: DC | PRN

## 2018-12-02 MED ORDER — FLEET ENEMA 7-19 GM/118ML RE ENEM: 1.0000 | ENEMA | Freq: Every day | RECTAL | Status: DC | PRN

## 2018-12-02 MED ORDER — SOD CITRATE-CITRIC ACID 500-334 MG/5ML PO SOLN
ORAL | Status: AC
  Filled 2018-12-02: qty 15

## 2018-12-02 MED ORDER — LABETALOL HCL 5 MG/ML IV SOLN: 80.0000 mg | INTRAVENOUS | Status: DC | PRN

## 2018-12-02 MED ORDER — PANTOPRAZOLE SODIUM 40 MG PO TBEC
40.0000 mg | DELAYED_RELEASE_TABLET | Freq: Every day | ORAL | Status: DC
  Administered 2018-12-03 – 2018-12-04 (×2): 40 mg via ORAL
  Filled 2018-12-02 (×2): qty 1

## 2018-12-02 MED ORDER — IBUPROFEN 800 MG PO TABS
800.0000 mg | ORAL_TABLET | Freq: Three times a day (TID) | ORAL | Status: DC
  Administered 2018-12-03 – 2018-12-04 (×3): 800 mg via ORAL
  Filled 2018-12-02 (×3): qty 1

## 2018-12-02 MED ORDER — LABETALOL HCL 5 MG/ML IV SOLN: 40.0000 mg | INTRAVENOUS | Status: DC | PRN

## 2018-12-02 MED ORDER — WITCH HAZEL-GLYCERIN EX PADS: 1.0000 "application " | MEDICATED_PAD | CUTANEOUS | Status: DC | PRN

## 2018-12-02 MED ORDER — SIMETHICONE 80 MG PO CHEW
80.0000 mg | CHEWABLE_TABLET | ORAL | Status: DC
  Administered 2018-12-03: 80 mg via ORAL
  Filled 2018-12-02: qty 1

## 2018-12-02 MED ORDER — ZOLPIDEM TARTRATE 5 MG PO TABS: 5.0000 mg | ORAL_TABLET | Freq: Every evening | ORAL | Status: DC | PRN

## 2018-12-02 MED ORDER — BISACODYL 10 MG RE SUPP: 10.0000 mg | Freq: Every day | RECTAL | Status: DC | PRN

## 2018-12-02 MED ORDER — MORPHINE SULFATE (PF) 0.5 MG/ML IJ SOLN
INTRAMUSCULAR | Status: AC
  Filled 2018-12-02: qty 10

## 2018-12-02 MED ORDER — MIDAZOLAM HCL 2 MG/2ML IJ SOLN
INTRAMUSCULAR | Status: AC
  Filled 2018-12-02: qty 2

## 2018-12-02 MED ORDER — FENTANYL CITRATE (PF) 100 MCG/2ML IJ SOLN
INTRAMUSCULAR | Status: DC | PRN
  Administered 2018-12-02: 35 ug via INTRAVENOUS
  Administered 2018-12-02: 15 ug via INTRATHECAL
  Administered 2018-12-02: 50 ug via INTRAVENOUS

## 2018-12-02 MED ORDER — NALBUPHINE SYRINGE 5 MG/0.5 ML
5.0000 mg | INJECTION | INTRAMUSCULAR | Status: DC | PRN
  Filled 2018-12-02: qty 0.5

## 2018-12-02 MED ORDER — SODIUM CHLORIDE 0.9 % IV SOLN
INTRAVENOUS | Status: DC | PRN
  Administered 2018-12-02: 50 ug/min via INTRAVENOUS

## 2018-12-02 MED ORDER — LACTATED RINGERS IV BOLUS
1000.0000 mL | Freq: Once | INTRAVENOUS | Status: AC
  Administered 2018-12-02: 500 mL via INTRAVENOUS

## 2018-12-02 MED ORDER — FENTANYL CITRATE (PF) 100 MCG/2ML IJ SOLN: 25.0000 ug | INTRAMUSCULAR | Status: DC | PRN

## 2018-12-02 MED ORDER — LABETALOL HCL 5 MG/ML IV SOLN: 20.0000 mg | INTRAVENOUS | Status: DC | PRN

## 2018-12-02 MED ORDER — ACETAMINOPHEN 500 MG PO TABS
1000.0000 mg | ORAL_TABLET | Freq: Four times a day (QID) | ORAL | Status: DC
  Administered 2018-12-03 – 2018-12-04 (×6): 1000 mg via ORAL
  Filled 2018-12-02 (×6): qty 2

## 2018-12-02 MED ORDER — KETOROLAC TROMETHAMINE 30 MG/ML IJ SOLN: 30.0000 mg | Freq: Four times a day (QID) | INTRAMUSCULAR | Status: AC | PRN

## 2018-12-02 MED ORDER — FAMOTIDINE 20 MG PO TABS
ORAL_TABLET | ORAL | Status: AC
  Administered 2018-12-02: 20 mg
  Filled 2018-12-02: qty 1

## 2018-12-02 MED ORDER — CEFAZOLIN SODIUM-DEXTROSE 2-4 GM/100ML-% IV SOLN
2.0000 g | INTRAVENOUS | Status: AC
  Administered 2018-12-02: 2 g via INTRAVENOUS
  Filled 2018-12-02: qty 100

## 2018-12-02 MED ORDER — NALOXONE HCL 4 MG/10ML IJ SOLN
1.0000 ug/kg/h | INTRAVENOUS | Status: DC | PRN
  Filled 2018-12-02: qty 5

## 2018-12-02 MED ORDER — OXYTOCIN 40 UNITS IN NORMAL SALINE INFUSION - SIMPLE MED
2.5000 [IU]/h | INTRAVENOUS | Status: AC
  Administered 2018-12-02: 2.5 [IU]/h via INTRAVENOUS
  Filled 2018-12-02: qty 1000

## 2018-12-02 MED ORDER — DIBUCAINE (PERIANAL) 1 % EX OINT: 1.0000 "application " | TOPICAL_OINTMENT | CUTANEOUS | Status: DC | PRN

## 2018-12-02 MED ORDER — LABETALOL HCL 100 MG PO TABS
100.0000 mg | ORAL_TABLET | Freq: Two times a day (BID) | ORAL | Status: DC
  Administered 2018-12-02 (×2): 100 mg via ORAL
  Filled 2018-12-02 (×2): qty 1

## 2018-12-02 MED ORDER — ESCITALOPRAM OXALATE 10 MG PO TABS
10.0000 mg | ORAL_TABLET | Freq: Every day | ORAL | Status: DC
  Administered 2018-12-03 – 2018-12-04 (×2): 10 mg via ORAL
  Filled 2018-12-02 (×2): qty 1

## 2018-12-02 MED ORDER — SIMETHICONE 80 MG PO CHEW
80.0000 mg | CHEWABLE_TABLET | Freq: Three times a day (TID) | ORAL | Status: DC
  Administered 2018-12-03 – 2018-12-04 (×4): 80 mg via ORAL
  Filled 2018-12-02 (×4): qty 1

## 2018-12-02 MED ORDER — NALBUPHINE SYRINGE 5 MG/0.5 ML
5.0000 mg | INJECTION | Freq: Once | INTRAMUSCULAR | Status: DC | PRN
  Filled 2018-12-02: qty 0.5

## 2018-12-02 MED ORDER — SODIUM CHLORIDE 0.9% FLUSH: 3.0000 mL | INTRAVENOUS | Status: DC | PRN

## 2018-12-02 MED ORDER — SIMETHICONE 80 MG PO CHEW: 80.0000 mg | CHEWABLE_TABLET | ORAL | Status: DC | PRN

## 2018-12-02 MED ORDER — ONDANSETRON HCL 4 MG/2ML IJ SOLN: 4.0000 mg | Freq: Three times a day (TID) | INTRAMUSCULAR | Status: DC | PRN

## 2018-12-02 MED ORDER — SODIUM CHLORIDE 0.9 % IV SOLN
500.0000 mg | INTRAVENOUS | Status: DC
  Administered 2018-12-02: 500 mg via INTRAVENOUS
  Filled 2018-12-02 (×2): qty 500

## 2018-12-02 MED ORDER — BUPIVACAINE HCL 0.5 % IJ SOLN
INTRAMUSCULAR | Status: DC | PRN
  Administered 2018-12-02: 10 mL

## 2018-12-02 MED ORDER — LACTATED RINGERS IV SOLN: INTRAVENOUS | Status: DC

## 2018-12-02 MED ORDER — PRENATAL MULTIVITAMIN CH
1.0000 | ORAL_TABLET | Freq: Every day | ORAL | Status: DC
  Administered 2018-12-03: 11:00:00 1 via ORAL
  Filled 2018-12-02: qty 1

## 2018-12-02 MED ORDER — DIPHENHYDRAMINE HCL 25 MG PO CAPS
25.0000 mg | ORAL_CAPSULE | Freq: Four times a day (QID) | ORAL | Status: DC | PRN
  Administered 2018-12-03: 25 mg via ORAL
  Filled 2018-12-02: qty 1

## 2018-12-02 MED ORDER — ONDANSETRON HCL 4 MG/2ML IJ SOLN: 4.0000 mg | Freq: Once | INTRAMUSCULAR | Status: DC | PRN

## 2018-12-02 MED ORDER — BUPIVACAINE ON-Q PAIN PUMP (FOR ORDER SET NO CHG)
INJECTION | Status: DC
  Filled 2018-12-02: qty 1

## 2018-12-02 MED ORDER — BUPIVACAINE IN DEXTROSE 0.75-8.25 % IT SOLN
INTRATHECAL | Status: DC | PRN
  Administered 2018-12-02: 1.6 mL via INTRATHECAL

## 2018-12-02 MED ORDER — OXYTOCIN 40 UNITS IN NORMAL SALINE INFUSION - SIMPLE MED
INTRAVENOUS | Status: DC | PRN
  Administered 2018-12-02: 600 mL via INTRAVENOUS

## 2018-12-02 MED ORDER — MENTHOL 3 MG MT LOZG
1.0000 | LOZENGE | OROMUCOSAL | Status: DC | PRN
  Filled 2018-12-02: qty 9

## 2018-12-02 MED ORDER — BUPIVACAINE HCL (PF) 0.5 % IJ SOLN
10.0000 mL | Freq: Once | INTRAMUSCULAR | Status: DC
  Filled 2018-12-02: qty 30

## 2018-12-02 MED ORDER — KETOROLAC TROMETHAMINE 30 MG/ML IJ SOLN
30.0000 mg | Freq: Four times a day (QID) | INTRAMUSCULAR | Status: AC | PRN
  Administered 2018-12-03 (×2): 30 mg via INTRAVENOUS
  Filled 2018-12-02 (×2): qty 1

## 2018-12-02 MED ORDER — OXYTOCIN 40 UNITS IN NORMAL SALINE INFUSION - SIMPLE MED
INTRAVENOUS | Status: AC
  Filled 2018-12-02: qty 1000

## 2018-12-02 MED ORDER — MIDAZOLAM HCL 2 MG/2ML IJ SOLN
INTRAMUSCULAR | Status: DC | PRN
  Administered 2018-12-02 (×2): 1 mg via INTRAVENOUS

## 2018-12-02 MED ORDER — HYDROMORPHONE HCL 1 MG/ML IJ SOLN: 0.2000 mg | INTRAMUSCULAR | Status: DC | PRN

## 2018-12-02 MED ORDER — MORPHINE SULFATE (PF) 0.5 MG/ML IJ SOLN
INTRAMUSCULAR | Status: DC | PRN
  Administered 2018-12-02: .1 mg via INTRATHECAL

## 2018-12-02 MED ORDER — FENTANYL CITRATE (PF) 100 MCG/2ML IJ SOLN
INTRAMUSCULAR | Status: AC
  Filled 2018-12-02: qty 2

## 2018-12-02 MED ORDER — PHENYLEPHRINE HCL (PRESSORS) 10 MG/ML IV SOLN
INTRAVENOUS | Status: DC | PRN
  Administered 2018-12-02 (×2): 100 ug via INTRAVENOUS

## 2018-12-02 MED ORDER — FERROUS SULFATE 325 (65 FE) MG PO TABS
325.0000 mg | ORAL_TABLET | Freq: Two times a day (BID) | ORAL | Status: DC
  Administered 2018-12-03: 325 mg via ORAL
  Filled 2018-12-02: qty 1

## 2018-12-02 MED ORDER — ONDANSETRON HCL 4 MG/2ML IJ SOLN
INTRAMUSCULAR | Status: DC | PRN
  Administered 2018-12-02: 4 mg via INTRAVENOUS

## 2018-12-02 MED ORDER — COCONUT OIL OIL
1.0000 "application " | TOPICAL_OIL | Status: DC | PRN
  Administered 2018-12-03: 1 via TOPICAL
  Filled 2018-12-02: qty 120

## 2018-12-02 MED ORDER — NALOXONE HCL 0.4 MG/ML IJ SOLN: 0.4000 mg | INTRAMUSCULAR | Status: DC | PRN

## 2018-12-02 MED ORDER — LACTATED RINGERS IV SOLN
INTRAVENOUS | Status: DC
  Administered 2018-12-02: 12:00:00 via INTRAVENOUS

## 2018-12-02 MED ORDER — FAMOTIDINE 20 MG PO TABS: 20.0000 mg | ORAL_TABLET | ORAL | Status: DC

## 2018-12-02 MED ORDER — SENNOSIDES-DOCUSATE SODIUM 8.6-50 MG PO TABS
2.0000 | ORAL_TABLET | ORAL | Status: DC
  Administered 2018-12-03 – 2018-12-04 (×2): 2 via ORAL
  Filled 2018-12-02 (×2): qty 2

## 2018-12-02 MED ORDER — BUPIVACAINE 0.25 % ON-Q PUMP DUAL CATH 400 ML
400.0000 mL | INJECTION | Status: DC
  Filled 2018-12-02: qty 400

## 2018-12-02 MED ORDER — BUTORPHANOL TARTRATE 1 MG/ML IJ SOLN
1.0000 mg | INTRAMUSCULAR | Status: DC | PRN
  Administered 2018-12-02 (×4): 1 mg via INTRAVENOUS
  Filled 2018-12-02 (×4): qty 1

## 2018-12-02 MED ORDER — OXYCODONE HCL 5 MG PO TABS
5.0000 mg | ORAL_TABLET | ORAL | Status: DC | PRN
  Filled 2018-12-02: qty 1

## 2018-12-02 SURGICAL SUPPLY — 34 items
CANISTER SUCT 3000ML PPV (MISCELLANEOUS) ×4 IMPLANT
CHLORAPREP W/TINT 26 (MISCELLANEOUS) ×8 IMPLANT
CLOSURE WOUND 1/2 X4 (GAUZE/BANDAGES/DRESSINGS) ×1
DERMABOND ADVANCED (GAUZE/BANDAGES/DRESSINGS) ×2
DERMABOND ADVANCED .7 DNX12 (GAUZE/BANDAGES/DRESSINGS) ×2 IMPLANT
DRESSING SURGICEL FIBRLLR 1X2 (HEMOSTASIS) ×2 IMPLANT
DRSG OPSITE POSTOP 4X10 (GAUZE/BANDAGES/DRESSINGS) ×4 IMPLANT
DRSG SURGICEL FIBRILLAR 1X2 (HEMOSTASIS) ×4
DRSG TEGADERM 4X4.75 (GAUZE/BANDAGES/DRESSINGS) ×4 IMPLANT
ELECT CAUTERY BLADE 6.4 (BLADE) ×4 IMPLANT
ELECT REM PT RETURN 9FT ADLT (ELECTROSURGICAL) ×4
ELECTRODE REM PT RTRN 9FT ADLT (ELECTROSURGICAL) ×2 IMPLANT
GLOVE BIOGEL PI IND STRL 6.5 (GLOVE) ×4 IMPLANT
GLOVE BIOGEL PI INDICATOR 6.5 (GLOVE) ×4
GOWN STRL REUS W/ TWL LRG LVL3 (GOWN DISPOSABLE) ×2 IMPLANT
GOWN STRL REUS W/ TWL XL LVL3 (GOWN DISPOSABLE) ×4 IMPLANT
GOWN STRL REUS W/TWL LRG LVL3 (GOWN DISPOSABLE) ×2
GOWN STRL REUS W/TWL XL LVL3 (GOWN DISPOSABLE) ×4
NS IRRIG 1000ML POUR BTL (IV SOLUTION) ×4 IMPLANT
PACK C SECTION AR (MISCELLANEOUS) ×4 IMPLANT
PAD OB MATERNITY 4.3X12.25 (PERSONAL CARE ITEMS) ×4 IMPLANT
PAD PREP 24X41 OB/GYN DISP (PERSONAL CARE ITEMS) ×4 IMPLANT
PENCIL SMOKE ULTRAEVAC 22 CON (MISCELLANEOUS) ×4 IMPLANT
RETRACTOR TRAXI PANNICULUS (MISCELLANEOUS) ×2 IMPLANT
SPONGE LAP 18X18 RF (DISPOSABLE) ×4 IMPLANT
STRIP CLOSURE SKIN 1/2X4 (GAUZE/BANDAGES/DRESSINGS) ×3 IMPLANT
SUT CHROMIC 0 CT 1 (SUTURE) ×4 IMPLANT
SUT MNCRL AB 4-0 PS2 18 (SUTURE) ×4 IMPLANT
SUT PLAIN 3-0 (SUTURE) ×4 IMPLANT
SUT VIC AB 0 CT1 36 (SUTURE) ×12 IMPLANT
SUT VIC AB 2-0 CT1 36 (SUTURE) ×4 IMPLANT
SUT VICRYL 0 27 CT2 27 ABS (SUTURE) ×4 IMPLANT
SYR 30ML LL (SYRINGE) ×8 IMPLANT
TRAXI PANNICULUS RETRACTOR (MISCELLANEOUS) ×2

## 2018-12-02 NOTE — Progress Notes (Signed)
L&D Progress Note  S: Complains of continued pelvic pain and pressure. HAs had 3 doses of Stadol. Is requesting more Stadol. Declines epidural for pain relief while awaiting Cesarean section  O: Vital signs: BP 134/75   Pulse 77   Temp 98.3 F (36.8 C) (Oral)   Resp 16   Ht 4\' 11"  (1.499 m)   Wt 100.7 kg   LMP 03/08/2018   BMI 44.84 kg/m    FHR: 130 baseline with moderate variaibility Toco: contractions every 2-6 minutes apart   Cervix: 1cm/50%/-2  Results for orders placed or performed during the hospital encounter of 12/02/18 (from the past 24 hour(s))  SARS Coronavirus 2 by RT PCR (hospital order, performed in [facility] lab) Nasopharyngeal Nasopharyngeal Swab     Status: None   Collection Time: 12/02/18 11:45 AM   Specimen: Nasopharyngeal Swab  Result Value Ref Range   SARS Coronavirus 2 NEGATIVE NEGATIVE  Type and screen [place]     Status: None   Collection Time: 12/02/18 11:49 AM  Result Value Ref Range   ABO/RH(D) O POS    Antibody Screen NEG    Sample Expiration      12/05/2018,2359 Performed at [facility], [address] [place], [place] [address]   CBC     Status: None   Collection Time: 12/02/18 11:49 AM  Result Value Ref Range   WBC 7.8 4.0 - 10.5 K/uL   RBC 4.27 3.87 - 5.11 MIL/uL   Hemoglobin 12.4 12.0 - 15.0 g/dL   HCT 37.4 36.0 - 46.0 %   MCV 87.6 80.0 - 100.0 fL   MCH 29.0 26.0 - 34.0 pg   MCHC 33.2 30.0 - 36.0 g/dL   RDW 13.2 11.5 - 15.5 %   Platelets 200 150 - 400 K/uL   nRBC 0.0 0.0 - 0.2 %   A: Previous Cesarean section, in early labor Some cervical change  P: Given one more dose of Stadol 1 mgm IV. Advised will hold further doses to avoid respiratory depression in the baby. Awaiting OR team  Dalia Heading, CNM

## 2018-12-02 NOTE — Op Note (Signed)
Cesarean Section Procedure Note 12/02/18  Pre-operative Diagnosis:  1. History of prior cesarean section  2. 38 weeks, latent labor  3. Desire for permanent sterilization Post-operative Diagnosis:   1. History of prior cesarean section  2. 38 weeks, latent labor  3. Desire for permanent sterilization  4.  Thin lower uterine segment Procedure: Repeat Low Transverse Cesarean Section and Bilateral Tubal Ligation Surgeon: Adrian Prows MD   Assistant(s): Clarita Leber CNM  - No other skilled surgical assistant available. Anesthesia: Spinal Estimated Blood Loss: 161 cc Complications: None; patient tolerated the procedure well.  Disposition: PACU - hemodynamically stable. Condition: stable   Findings: A female infant in the cephalic presentation. Amniotic fluid - clear   Birth weight: 7 lbs 5oz Apgars of 9 and 9.  Intact placenta with a three-vessel cord. Grossly normal uterus, tubes and ovaries bilaterally. No intraabdominal adhesions were noted.   Procedure Details    The patient was taken to operating room, identified as the correct patient and the procedure verified as C-Section Delivery. A time out was held and the above information confirmed. After induction of anesthesia, the patient was draped and prepped in the usual sterile manner. A Pfannenstiel incision was made and carried down through the subcutaneous tissue to the fascia. Fascial incision was made and extended transversely with the Mayo scissors. The fascia was separated from the underlying rectus tissue superiorly and inferiorly. The peritoneum was identified and entered bluntly. Peritoneal incision was extended longitudinally. A low transverse hysterotomy was made. The fetus was delivered atraumatically. The umbilical cord was clamped x2 and cut and the infant was handed to the awaiting pediatricians. The placenta was removed intact and appeared normal with a 3-vessel cord. The uterus was exteriorized and cleared of  all clot and debris. The hysterotomy was closed with running sutures of 0 Vicryl suture. There was a right extension of the lower uterine segment which was repaired with 0-vicryl.  A second imbricating layer was placed with the same suture. Excellent hemostasis was observed.   Desire for tubal ligation was again verbally confirmed with the patient. The right fallopian tube was followed from the cornua to the fimbria. The babcock clamp was used to grasp and elevate an avascular midsection of the tube approximately 3-4cm from the cornua. 0-chromic suture was used passed through the mesosalpinx and used to create a knuckle to fallopian tube. The tube was double ligated with 0- chromic suture and the intervening portion of tube was transected and removed with the Metzenbaum scissors. Excellent hemostasis was noted. Attention was then turned to the left fallopian tube after confirmation of identification by tracing the tube out to the fimbriae. The same procedure was then performed on the left fallopian tube. Again, excellent hemostasis was noted at the end of the procedure at both sites of the tubal ligation.    The uterus was returned to the abdomen. The pelvis was irrigated. The bovie cautery and two figure of eight sutures were placed to obtain hemostasis.  Excellent hemostasis was noted. Fibrillar was placed along the lower uterine segment. The peritoneum was closed with a running stitch of 2-0 Vicryl. The On Q Pain pump System was then placed.  Trocars were placed through the abdominal wall into the subfascial space and these were used to thread the silver soaker cathaters into place.The rectus muscles were inspected and were hemostatic. The rectus fascia was then reapproximated with running sutures of 0-vicryl, with careful placement not to incorporate the cathaters. Subcutaneous tissues are then irrigated  with saline and hemostasis assured with the bovie. The subcutaneous fat was approximated with 3-0 plain  and a running stitch.  The skin was closed with 4-0 monocryl suture in a subcuticular fashion followed by skin adhesive. The cathaters are flushed each with 5 mL of Bupivicaine and stabilized into place with dressing. Instrument, sponge, and needle counts were correct prior to the abdominal closure and at the conclusion of the case.  The patient tolerated the procedure well and was transferred to the recovery room in stable condition.   Natale Milch MD [facility], [facility] 12/02/18 8:39 PM

## 2018-12-02 NOTE — Discharge Summary (Signed)
OB Discharge Summary     Patient Name: Lacey Rodgers DOB: Jul 07, 1980 MRN: [id number]  Date of admission: 12/02/2018 Delivering MD: Dalia Heading, CNM  Date of Delivery: 12/02/2018  Date of discharge: 12/04/2018  Admitting diagnosis: 38 weeks contractions Intrauterine pregnancy: [redacted]w[redacted]d     Secondary diagnosis: Chronic Hypertension, Previous Cesarean section, Undesired fertility     Discharge diagnosis: Term Pregnancy Delivered, Reasons for cesarean section  Elective repeat                         Hospital course:  Onset of Labor With Unplanned C/S  38 y.o. yo [id number] at [redacted]w[redacted]d was admitted in Latent Labor on 12/02/2018. Patient had a labor course significant for requiring stadol for painful contractions Membrane Rupture Time/Date: 6:50 PM ,12/02/2018   The patient went for cesarean section due to Elective Repeat, and delivered a Viable infant,12/02/2018. She also had a BTL Details of operation can be found in separate operative note. Patient had an uncomplicated postpartum course.  She is ambulating,tolerating a regular diet, passing flatus, and urinating well.  Patient is discharged home in stable condition 12/04/18.                                                                 Post partum procedures:none  Complications: None  Physical exam on 12/04/2018: Vitals:   12/03/18 1127 12/03/18 1935 12/03/18 2238 12/04/18 0838  BP: 121/74 132/77 140/85 126/75  Pulse: 74 77 79 76  Resp: 18 18 18 20   Temp: 98.2 F (36.8 C) 97.8 F (36.6 C)  97.6 F (36.4 C)  TempSrc: Oral Oral  Oral  SpO2: 98% 97% 92% 98%  Weight:      Height:       General: alert, cooperative and no distress Lochia: appropriate Uterine Fundus: firm/ U-2FB/ML/NT Incision: Dressing is clean, dry, and intact DVT Evaluation: No evidence of DVT seen on physical exam.  Labs: Lab Results  Component Value Date   WBC 8.7 12/03/2018   HGB 10.8 (L) 12/03/2018   HCT 33.4 (L) 12/03/2018   MCV 88.8 12/03/2018   PLT  194 12/03/2018   CMP Latest Ref Rng & Units 12/02/2018  Glucose 70 - 99 mg/dL 111(H)  BUN 6 - 20 mg/dL 7  Creatinine 0.44 - 1.00 mg/dL 0.64  Sodium 135 - 145 mmol/L 139  Potassium 3.5 - 5.1 mmol/L 3.8  Chloride 98 - 111 mmol/L 109  CO2 22 - 32 mmol/L 19(L)  Calcium 8.9 - 10.3 mg/dL 8.7(L)  Total Protein 6.5 - 8.1 g/dL 5.8(L)  Total Bilirubin 0.3 - 1.2 mg/dL 0.6  Alkaline Phos 38 - 126 U/L 82  AST 15 - 41 U/L 19  ALT 0 - 44 U/L 10    Discharge instruction: per After Visit Summary.  Medications:  Allergies as of 12/04/2018      Reactions   Pseudoeph-doxylamine-dm-apap Hives      Medication List    STOP taking these medications   guaiFENesin-dextromethorphan 100-10 MG/5ML syrup Commonly known as: ROBITUSSIN DM   Proctofoam HC rectal foam Generic drug: hydrocortisone-pramoxine     TAKE these medications   acetaminophen 500 MG tablet Commonly known as: TYLENOL Take 2 tablets (1,000 mg total) by mouth every 6 (six) hours as  needed for mild pain or moderate pain.   escitalopram 10 MG tablet Commonly known as: Lexapro Take 1 tablet (10 mg total) by mouth daily.   ibuprofen 600 MG tablet Commonly known as: ADVIL Take 1 tablet (600 mg total) by mouth every 6 (six) hours as needed.   labetalol 100 MG tablet Commonly known as: NORMODYNE Take 1 tablet (100 mg total) by mouth 2 (two) times daily.   omeprazole 20 MG capsule Commonly known as: PRILOSEC Take 1 capsule (20 mg total) by mouth daily as needed. What changed:   when to take this  reasons to take this   oxyCODONE 5 MG immediate release tablet Commonly known as: Oxy IR/ROXICODONE Take 1 tablet (5 mg total) by mouth every 6 (six) hours as needed for up to 7 days for moderate pain or severe pain.   PRENATAL VITAMIN PO Take 1 tablet by mouth daily.   valACYclovir 500 MG tablet Commonly known as: VALTREX Take 1 tablet (500 mg total) by mouth 2 (two) times daily as needed. What changed:   when to take  this  reasons to take this       Diet: routine diet  Activity: Advance as tolerated. Pelvic rest for 6 weeks.   Outpatient follow up: Follow-up Information    Natale Milch, MD. Go on 12/11/2018.   Specialty: Obstetrics and Gynecology Why: For incision check Contact information: [address] [place] [address] [phone]             Postpartum contraception: Tubal Ligation Rhogam Given postpartum: no Rubella vaccine given postpartum: no Varicella vaccine given postpartum: no TDaP given antepartum or postpartum: Yes  Newborn Data: Live born female [name] Birth Weight: 7 lb 4.8 oz (3310 g) APGAR: 9, 9  Newborn Delivery   Birth date/time: 12/02/2018 18:50:00 Delivery type: C-Section, Low Transverse Trial of labor: No C-section categorization: Repeat       Baby Feeding: Breast  Disposition:home with mother  SIGNED:  Farrel Conners, CNM 12/04/2018 10:56 AM

## 2018-12-02 NOTE — Anesthesia Post-op Follow-up Note (Signed)
Anesthesia QCDR form completed.        

## 2018-12-02 NOTE — Transfer of Care (Signed)
Immediate Anesthesia Transfer of Care Note  Patient: Lacey Rodgers  Procedure(s) Performed: CESAREAN SECTION WITH BILATERAL TUBAL LIGATION  Patient Location: PACU  Anesthesia Type:Spinal  Level of Consciousness: awake, alert , oriented and pateint uncooperative  Airway & Oxygen Therapy: Patient Spontanous Breathing  Post-op Assessment: Report given to RN and Post -op Vital signs reviewed and stable  Post vital signs: Reviewed and stable  Last Vitals:  Vitals Value Taken Time  BP    Temp    Pulse    Resp    SpO2      Last Pain:  Vitals:   12/02/18 1610  TempSrc: Oral  PainSc: 8       Patients Stated Pain Goal: 2 (25/36/64 4034)  Complications: No apparent anesthesia complications

## 2018-12-02 NOTE — Anesthesia Preprocedure Evaluation (Signed)
Anesthesia Evaluation  Patient identified by MRN, date of birth, ID band Patient awake    Reviewed: Allergy & Precautions, NPO status , Patient's Chart, lab work & pertinent test results  History of Anesthesia Complications Negative for: history of anesthetic complications  Airway Mallampati: I  TM Distance: >3 FB Neck ROM: Full    Dental  (+) Dental Advidsory Given, Caps, Teeth Intact   Pulmonary neg pulmonary ROS,           Cardiovascular Exercise Tolerance: Good hypertension, Pt. on medications (-) angina(-) dysrhythmias (-) Valvular Problems/Murmurs     Neuro/Psych  Headaches, PSYCHIATRIC DISORDERS Depression    GI/Hepatic Neg liver ROS, GERD  ,  Endo/Other  neg diabetesMorbid obesity  Renal/GU negative Renal ROS     Musculoskeletal   Abdominal   Peds  Hematology   Anesthesia Other Findings Past Medical History: No date: Depression No date: Genital herpes No date: GERD (gastroesophageal reflux disease) No date: Hypertension No date: Migraine No date: Obesity affecting pregnancy  Scoliosis  Reproductive/Obstetrics (+) Pregnancy                             Anesthesia Physical  Anesthesia Plan  ASA: III  Anesthesia Plan: Spinal   Post-op Pain Management:    Induction:   PONV Risk Score and Plan:   Airway Management Planned:   Additional Equipment:   Intra-op Plan:   Post-operative Plan:   Informed Consent:     Dental advisory given  Plan Discussed with: CRNA and Anesthesiologist  Anesthesia Plan Comments:         Anesthesia Quick Evaluation

## 2018-12-02 NOTE — Anesthesia Procedure Notes (Addendum)
Spinal  Patient location during procedure: OR Start time: 12/02/2018 6:03 PM End time: 12/02/2018 6:24 PM Staffing Anesthesiologist: Martha Clan, MD Resident/CRNA: Aline Brochure, CRNA Performed: resident/CRNA and anesthesiologist  Preanesthetic Checklist Completed: patient identified, site marked, surgical consent, pre-op evaluation, timeout performed, IV checked, risks and benefits discussed and monitors and equipment checked Spinal Block Patient position: sitting Prep: ChloraPrep Patient monitoring: heart rate, continuous pulse ox, blood pressure and cardiac monitor Approach: midline Location: L2-3 Injection technique: single-shot Needle Needle type: Whitacre and Introducer  Needle gauge: 24 G Needle length: 9 cm Assessment Sensory level: T10 Additional Notes Negative paresthesia. Negative blood return. Positive free-flowing CSF. Expiration date of kit checked and confirmed. Patient tolerated procedure well, without complications.

## 2018-12-02 NOTE — OB Triage Note (Signed)
Pt. Presented to L/D triage with reported contractions since this morning. She describes the pain as intermittent, and rates it 8/10. It has been unrelieved by rest. She reports no bleeding or LOF and positive fetal movement. No urinary symptoms. VSS. Patient stable at this time.

## 2018-12-02 NOTE — H&P (Signed)
OB History & Physical   History of Present Illness:  Chief Complaint:   HPI:  Lacey Rodgers is a 38 y.o. G56P1021 female with EDC=12/13/2018 at [redacted]w[redacted]d dated by LMP=7wk.  Her pregnancy has been complicated by chronic hypertension (currently on labetalol 100 mgm BID), depression, genital herpes, obesity (current BMI 44.84 kg/m2) and a prior Cesarean section for arrest of labor in the first stage..  She presents to L&D with complaints of contractions since 0330 this AM. She rates the pain 8/10. She denies leakage of water from the vagina or vaginal bleeding. Baby is active. Is scheduled for a repeat CS and BTL on 12/10/2018   Prenatal care site: Prenatal care at [facility] has also been remarkable for  Cl[facility] Prenatal Labs  Dating LMP = 7 week [place] Blood type: O/Positive/-- (04/29 0935)   Genetic Screen NIPS: Normal XY Antibody:Negative (04/29 0935)  Anatomic [place] Normal, anterior placenta Rubella: 1.92 (04/29 0935) Varicella: Immune  GTT Early: 100 Third trimester: 116 RPR: Non Reactive (09/10 1047)   Rhogam N/A HBsAg: Negative (04/29 0935)   TDaP vaccine 10/15/2018  Flu Shot: 10/17/2018 HIV: Non Reactive (09/10 1047)   Baby Food       Breast                         [id number]  Contraception Tubal ligation Pap: 05/06/2015 NIL HPV negative  CBB     CS/VBAC Repeat 12/10/2018   Support Person           Maternal Medical History:   Past Medical History:  Diagnosis Date  . Depression   . Genital herpes   . GERD (gastroesophageal reflux disease)   . Hypertension   . Migraine   . Obesity affecting pregnancy     Past Surgical History:  Procedure Laterality Date  . CESAREAN SECTION N/A 12/16/2015   Procedure: CESAREAN SECTION;  Surgeon: Will Bonnet, MD;  Location: [facility] ORS;  Service: Obstetrics;  Laterality: N/A;  . DILATION AND CURETTAGE OF UTERUS  01/2013  . NO PAST SURGERIES      Allergies  Allergen Reactions  . Pseudoeph-Doxylamine-Dm-Apap Hives    Prior to  Admission medications   Medication Sig Start Date End Date Taking? Authorizing Provider  escitalopram (LEXAPRO) 10 MG tablet Take 1 tablet (10 mg total) by mouth daily. 11/26/18  Yes Schuman, Christanna R, MD         labetalol (NORMODYNE) 100 MG tablet Take 1 tablet (100 mg total) by mouth 2 (two) times daily. 11/26/18  Yes Schuman, Christanna R, MD  omeprazole (PRILOSEC) 20 MG capsule Take 20 mg by mouth daily.    Yes [provider]  Prenatal Vit-Fe Fumarate-FA (PRENATAL VITAMIN PO) Take 1 tablet by mouth daily.   Yes [provider]  valACYclovir (VALTREX) 500 MG tablet Take 1 tablet (500 mg total) by mouth 2 (two) times daily. 11/26/18  Yes Schuman, Stefanie Libel, MD            Social History: She  reports that she has never smoked. She has never used smokeless tobacco. She reports that she does not drink alcohol or use drugs.  Family History: family history includes Cancer (age of onset: 57) in her brother; Hypertension in her brother, father, maternal grandfather, maternal grandmother, mother, paternal grandfather, and paternal grandmother.   Review of Systems: Negative x 10 systems reviewed except as noted in the HPI.      Physical Exam:  Vital Signs:  BP 136/73 (BP Location: Right Leg)   Pulse 97   Temp 98.5 F (36.9 C) (Oral)   Resp 16   Ht 4\' 11"  (1.499 m)   Wt 100.7 kg   LMP 03/08/2018   BMI 44.84 kg/m  General: no acute distress. Concentrating through contractions  HEENT: normocephalic, atraumatic Heart: regular rate & rhythm.  No murmurs/rubs/gallops Lungs: clear to auscultation bilaterally Abdomen: soft, gravid, non-tender;  EFW: 7# Pelvic:   External: Normal external female genitalia  Cervix: Dilation: Closed / Effacement (%): 40 / Station: -2  On arrival, then closed/ 50%/-1 1-2 hours later  Extremities: non-tender, symmetric, no edema bilaterally.  DTRs: +3/+3  Neurologic: Alert & oriented x 3.   Baseline FHR: baseline 140 with accelerations  to 170s, moderate variability Toco: contractions every 2-5 minutes apart  Assessment:  Lacey Rodgers is a 38 y.o. G61P1021 female at [redacted]w[redacted]d with prior Cesarean section in early labor. Desires ERCS, declines TOLAC Desires BTL FWB: Cat 1 tracing .   Plan:  1. Admit to Labor & Delivery in preparation for repeat CS and BTL later today. DIscussed case with both Dr [redacted]w[redacted]d, anesthesia and Dr Noralyn Pick. Will plan on surgery around 1630 (8 hours after she ate) today. Will try to manage pain with IV analgesia or epidural. If begins to progress rapidly, will move for surgery sooner.  2. CBC, T&S, Clrs, IVF, Covid testing, NPO 3. GBS negative.   4. Consents obtained. Discussed risks of surgery to include infection, bleeding, risks of injury to other organs or blood vessels and the risks of anesthesia. Has previously signed consent for blood transfusion.  Also discussed permanence of tubal ligation, small risk of failure, risks of ectopic pregnancy with a pregnancy, and the risks of bleeding. She wishes to proceed with BTL 5.  Breast 6. TDAP and flu vaccine given AP 7. O POS/ RI/ VI    Jerene Pitch  12/02/2018 11:32 AM

## 2018-12-03 ENCOUNTER — Encounter: Payer: No Typology Code available for payment source | Admitting: Obstetrics and Gynecology

## 2018-12-03 ENCOUNTER — Ambulatory Visit: Payer: No Typology Code available for payment source

## 2018-12-03 ENCOUNTER — Encounter: Payer: Self-pay | Admitting: Obstetrics and Gynecology

## 2018-12-03 LAB — PROTEIN / CREATININE RATIO, URINE
Creatinine, Urine: 276 mg/dL
Protein Creatinine Ratio: 0.18 mg/mg{Cre} — ABNORMAL HIGH (ref 0.00–0.15)
Total Protein, Urine: 49 mg/dL

## 2018-12-03 LAB — CBC
HCT: 33.4 % — ABNORMAL LOW (ref 36.0–46.0)
Hemoglobin: 10.8 g/dL — ABNORMAL LOW (ref 12.0–15.0)
MCH: 28.7 pg (ref 26.0–34.0)
MCHC: 32.3 g/dL (ref 30.0–36.0)
MCV: 88.8 fL (ref 80.0–100.0)
Platelets: 194 10*3/uL (ref 150–400)
RBC: 3.76 MIL/uL — ABNORMAL LOW (ref 3.87–5.11)
RDW: 13.2 % (ref 11.5–15.5)
WBC: 8.7 10*3/uL (ref 4.0–10.5)
nRBC: 0 % (ref 0.0–0.2)

## 2018-12-03 LAB — RPR: RPR Ser Ql: NONREACTIVE

## 2018-12-03 MED ORDER — FERROUS SULFATE 325 (65 FE) MG PO TABS
325.0000 mg | ORAL_TABLET | Freq: Every day | ORAL | Status: DC
  Administered 2018-12-04: 325 mg via ORAL
  Filled 2018-12-03: qty 1

## 2018-12-03 MED ORDER — OXYCODONE HCL 5 MG PO TABS
5.0000 mg | ORAL_TABLET | ORAL | Status: DC | PRN
  Administered 2018-12-04: 5 mg via ORAL

## 2018-12-03 NOTE — Anesthesia Postprocedure Evaluation (Signed)
Anesthesia Post Note  Patient: Lacey Rodgers  Procedure(s) Performed: CESAREAN SECTION WITH BILATERAL TUBAL LIGATION  Patient location during evaluation: Mother Baby Anesthesia Type: Spinal Level of consciousness: awake, oriented and awake and alert Pain management: pain level controlled Vital Signs Assessment: post-procedure vital signs reviewed and stable Respiratory status: spontaneous breathing Cardiovascular status: blood pressure returned to baseline Postop Assessment: no headache Anesthetic complications: no     Last Vitals:  Vitals:   12/03/18 0349 12/03/18 0734  BP: 107/60 110/61  Pulse: 83 77  Resp: 18 20  Temp: 36.8 C 36.9 C  SpO2: 97% 97%    Last Pain:  Vitals:   12/03/18 0734  TempSrc: Oral  PainSc:                  Buckner [place]

## 2018-12-03 NOTE — Progress Notes (Signed)
POD #1 (12 hours post op) S/P repeat Cesarean section and BTL Subjective:  Doing well. Slept a little off and on during the night. Tolerating regular diet for breakfast. Foley removed this AM, has not voided yet.    Objective:  Blood pressure 110/61, pulse 77, temperature 98.4 F (36.9 C), temperature source Oral, resp. rate 20, height 4\' 11"  (1.499 m), weight 100.7 kg, last menstrual period 03/08/2018, SpO2 97 %,  currently breastfeeding. Patient Vitals for the past 24 hrs:  BP Temp Temp src Pulse Resp SpO2 Height Weight  12/03/18 0734 110/61 98.4 F (36.9 C) Oral 77 20 97 % - -  12/03/18 0349 107/60 98.3 F (36.8 C) Oral 83 18 97 % - -  12/03/18 0002 124/67 98.3 F (36.8 C) Oral 88 18 96 % - -  12/02/18 2303 126/62 98.3 F (36.8 C) Oral 89 18 96 % - -  12/02/18 2229 137/77 - - 92 - 98 % - -  12/02/18 2201 132/88 - - 88 - 98 % - -  12/02/18 2148 - - - 90 (!) 23 98 % - -  12/02/18 2139 135/77 - - 90 16 99 % - -  12/02/18 2135 - - - 91 - 99 % - -  12/02/18 2134 - - - 89 - 100 % - -  12/02/18 2133 - - - 89 - 100 % - -  12/02/18 2132 - - - 90 - 100 % - -  12/02/18 2131 - - - 93 - 100 % - -  12/02/18 2130 (!) 163/87 - - 88 - 100 % - -  12/02/18 2113 (!) 143/87 - - 91 - 99 % - -  12/02/18 2057 - - - 94 - 98 % - -  12/02/18 2056 - - - 96 - 98 % - -  12/02/18 2055 - - - 94 - 98 % - -  12/02/18 2054 - - - 93 - 99 % - -  12/02/18 2046 (!) 152/87 - - 96 - 99 % - -  12/02/18 2041 135/68 97.7 F (36.5 C) Oral 96 16 98 % - -  12/02/18 2030 132/68 - - 88 16 98 % - -  12/02/18 1626 135/83 - - 85 - - - -  12/02/18 1610 - 98.3 F (36.8 C) Oral - - - - -  12/02/18 1249 134/75 - - 77 - - - -  12/02/18 1142 (!) 143/84 - - 79 - - - -  12/02/18 1126 (!) 143/84 - - 84 - - - -  12/02/18 0928 136/73 98.5 F (36.9 C) Oral 97 16 - 4\' 11"  (1.499 m) 100.7 kg  Last dose of labetalol 100 mgm  Was at 2203 last night. Blood pressures WNL since then.   Foley output 800 ml in about 10-11  hours  General: WF in NAD Pulmonary: no increased work of breathing Heart: RRR without murmur Abdomen: non-distended, non-tender, fundus firm at level of umbilicus-1 FB, bowel sounds hypoactive Incision: Honeycomb dressing C&D&I, ON Q intact Extremities: SCDs on  Results for orders placed or performed during the hospital encounter of 12/02/18 (from the past 72 hour(s))  SARS Coronavirus 2 by RT PCR (hospital order, performed in [facility] hospital lab) Nasopharyngeal Nasopharyngeal Swab     Status: None   Collection Time: 12/02/18 11:45 AM   Specimen: Nasopharyngeal Swab  Result Value Ref Range   SARS Coronavirus 2 NEGATIVE NEGATIVE    Comment: (NOTE) SARS-CoV-2 target nucleic acids are NOT DETECTED.  The SARS-CoV-2 RNA is generally detectable in upper and lower respiratory specimens during the acute phase of infection. The lowest concentration of SARS-CoV-2 viral copies this assay can detect is 250 copies / mL. A negative result does not preclude SARS-CoV-2 infection and should not be used as the sole basis for treatment or other patient management decisions.  A negative result may occur with improper specimen collection / handling, submission of specimen other than nasopharyngeal swab, presence of viral mutation(s) within the areas targeted by this assay, and inadequate number of viral copies (<250 copies / mL). A negative result must be combined with clinical observations, patient history, and epidemiological information. Fact Sheet for Patients:   BoilerBrush.com.cy Fact Sheet for Healthcare Providers: https://pope.com/ This test is not yet approved or cleared  by the [place] FDA and has been authorized for detection and/or diagnosis of SARS-CoV-2 by FDA under an Emergency Use Authorization (EUA).  This EUA will remain in effect (meaning this test can be used) for the duration of the COVID-19 declaration under Section  564(b)(1) of the Act, 21 U.S.C. section 360bbb-3(b)(1), unless the authorization is terminated or revoked sooner. Performed at [facility], [address] [place], [place] [address]   Type and screen [facility]     Status: None   Collection Time: 12/02/18 11:49 AM  Result Value Ref Range   ABO/RH(D) O POS    Antibody Screen NEG    Sample Expiration      12/05/2018,2359 Performed at [facility], [address] [place], [place] [address]   CBC     Status: None   Collection Time: 12/02/18 11:49 AM  Result Value Ref Range   WBC 7.8 4.0 - 10.5 K/uL   RBC 4.27 3.87 - 5.11 MIL/uL   Hemoglobin 12.4 12.0 - 15.0 g/dL   HCT 62.3 76.2 - 83.1 %   MCV 87.6 80.0 - 100.0 fL   MCH 29.0 26.0 - 34.0 pg   MCHC 33.2 30.0 - 36.0 g/dL   RDW 51.7 61.6 - 07.3 %   Platelets 200 150 - 400 K/uL   nRBC 0.0 0.0 - 0.2 %    Comment: Performed at [facility], [address] [place], [place] [address]  Comprehensive metabolic panel     Status: Abnormal   Collection Time: 12/02/18 10:13 PM  Result Value Ref Range   Sodium 139 135 - 145 mmol/L   Potassium 3.8 3.5 - 5.1 mmol/L   Chloride 109 98 - 111 mmol/L   CO2 19 (L) 22 - 32 mmol/L   Glucose, Bld 111 (H) 70 - 99 mg/dL   BUN 7 6 - 20 mg/dL   Creatinine, Ser 6.94 0.44 - 1.00 mg/dL   Calcium 8.7 (L) 8.9 - 10.3 mg/dL   Total Protein 5.8 (L) 6.5 - 8.1 g/dL   Albumin 2.6 (L) 3.5 - 5.0 g/dL   AST 19 15 - 41 U/L   ALT 10 0 - 44 U/L   Alkaline Phosphatase 82 38 - 126 U/L   Total Bilirubin 0.6 0.3 - 1.2 mg/dL   GFR calc non Af Amer >60 >60 mL/min   GFR calc Af Amer >60 >60 mL/min   Anion gap 11 5 - 15    Comment: Performed at [facility], [address] [place], [place] [address]  CBC     Status: Abnormal   Collection Time: 12/03/18  5:48 AM  Result Value Ref Range   WBC 8.7 4.0 -  10.5 K/uL   RBC 3.76 (L) 3.87 - 5.11 MIL/uL   Hemoglobin 10.8 (L) 12.0 - 15.0 g/dL   HCT 14.733.4 (L) 82.936.0 -  46.0 %   MCV 88.8 80.0 - 100.0 fL   MCH 28.7 26.0 - 34.0 pg   MCHC 32.3 30.0 - 36.0 g/dL   RDW 56.213.2 13.011.5 - 86.515.5 %   Platelets 194 150 - 400 K/uL   nRBC 0.0 0.0 - 0.2 %    Comment: Performed at [facility], [address] [place], [place] [address]  Protein / creatinine ratio, urine     Status: Abnormal   Collection Time: 12/03/18  6:11 AM  Result Value Ref Range   Creatinine, Urine 276 mg/dL   Total Protein, Urine 49 mg/dL    Comment: NO NORMAL RANGE ESTABLISHED FOR THIS TEST   Protein Creatinine Ratio 0.18 (H) 0.00 - 0.15 mg/mg[Cre]    Comment: Performed at [facility], [address] [place], [place] [address]     Assessment:   38 y.o. [id number] postoperativeday # 1-stable  Trial of voiding  Ambulate today  PO analgesics  Continue postpartum care CHTN-continue labetalol 100 mgm BID. Hold dose if blood pressure less than 110/60   Plan:  1)Mild  acute blood loss anemia - hemodynamically stable and asymptomatic - po vitamins with iron supplement  2)O POS/ VI/ RI  3) TDAP given AP   4) Breast/Contraception-BTL  5) Disposition-possible discharge on POD #2 or #3  Farrel Connersolleen Julianny Milstein, CNM

## 2018-12-03 NOTE — Lactation Note (Signed)
This note was copied from a baby's chart. Lactation Consultation Note  Patient Name: Lacey Rodgers Date: 12/03/2018 Reason for consult: Follow-up assessment  Mother called for [facility] assistance, and LC and [place] student entered the room to [name] resting with his dad. Mom took [place] and started positioning him for breastfeeding, but [facility] student offered to remove his clothes so that he could be skin to skin to feed.   [name] had a dirty diaper (pee and poop) so his diaper was changed as well before being returned to mom. [name] was placed in football position at the left breast for a feeding attempt, but would not latch. [facility] student assisted with sandwiching the breast, positioning [name] and attempted to hand express colostrum to entice him to latch. [facility] student could not express any colostrum at this attempt. [name] was very sleepy, and never achieved a good latch or suck. Mom stated that [name] had fed at 12:30 so he may not have been hungry enough yet.   Mom is concerned that she is not making enough milk for [name] so we reviewed expectations for pees and poops, how to tell that baby is full and happy with relaxed hands, and that cluster feeding is normal and expected in newborns.     Maternal Data Formula Feeding for Exclusion: No  Feeding Feeding Type: Breast Fed  LATCH Score                   Interventions Interventions: Breast feeding basics reviewed;Assisted with latch;Skin to skin;Hand express;Adjust position;Support pillows  Lactation Tools Discussed/Used     Consult Status Consult Status: Follow-up Date: 12/03/18 Follow-up type: In-patient    [name] 12/03/2018, 2:50 PM

## 2018-12-03 NOTE — Lactation Note (Signed)
This note was copied from a baby's chart. Lactation Consultation Note  Patient Name: Lacey Rodgers [id number] Date: 12/03/2018 Reason for consult: Follow-up assessment  LC student entered room after nurse stated that assistance was needed. Found Lacey Rodgers nursing at the left breast in football hold. Lacey Rodgers appeared to be done feeding after 7 minutes, but mom stated that she is very concerned that baby is not getting enough to eat, and that her husband is concerned too. [name] student talked through output expectations and behavior after a feed as signs of satiation. Also educated mom that if the nurses or doctors were concerned about intake they would let her know.   Lacey Rodgers was fussy and [name] student burped him after which he began showing hunger cues again. Mom was concerned that she could not feed at the right breast due to a blister that she has on her areola from pumping. [name] student assured her that she should continue to feed on that side, and that her nurse could provide coconut oil for soothing and lubrication while pumping. Lacey Rodgers was latched at the right breast in cradle hold, and fed for at least 10 minutes. Mom was instructed to continue to feed Lacey Rodgers until he was done, and to stimulate him to attempt to get him to suck when he took a long break. Baby was still nursing when [name] and [name] student left the room.     Maternal Data Formula Feeding for Exclusion: No  Feeding Feeding Type: Breast Fed  LATCH Score Latch: Grasps breast easily, tongue down, lips flanged, rhythmical sucking.  Audible Swallowing: Spontaneous and intermittent  Type of Nipple: Everted at rest and after stimulation  Comfort (Breast/Nipple): Filling, red/small blisters or bruises, mild/mod discomfort(small blister on areola from pumping )  Hold (Positioning): Assistance needed to correctly position infant at breast and maintain latch.  LATCH Score: 8  Interventions Interventions: Support pillows;Adjust  position;Position options;Breast feeding basics reviewed;Skin to skin;Coconut oil  Lactation Tools Discussed/Used     Consult Status Consult Status: Follow-up Date: 12/03/18 Follow-up type: In-patient    Lacey Rodgers 12/03/2018, 5:21 PM

## 2018-12-03 NOTE — Lactation Note (Signed)
This note was copied from a baby's chart. Lactation Consultation Note  Patient Name: Lacey Rodgers [id number] Date: 12/03/2018 Reason for consult: Initial assessment  LC student entered room to find that baby was away for circumcision. Mom has one other child, and said that breastfeeding just didn't work out the first time. She says that she feels better about it this time. Baby [name] seems to be feeding well, but Mom had concerns in the night that he was not getting anything as she was unable to "squeeze" anything out of her breast. This was why she asked for formula. Reviewed hand expression method so that she can try again to assess her production if she would like to, but assured her that there is almost certainly more being produced than she thinks. Reviewed feeding cues and that catching them early is better, and encouraged Mom to track the pees and poops. Discussed that [name] may be tired today and not as interested in feeding after his circumcision, and that would be totally normal.   Lactation number was written on the board and Mom was encouraged to call out for any assistance.    Maternal Data Formula Feeding for Exclusion: No Has patient been taught Hand Expression?: Yes Does the patient have breastfeeding experience prior to this delivery?: Yes  Feeding Feeding Type: Breast Fed  LATCH Score                   Interventions Interventions: Breast feeding basics reviewed;Hand express  Lactation Tools Discussed/Used     Consult Status Consult Status: Follow-up Date: 12/03/18 Follow-up type: In-patient    Lacey Rodgers 12/03/2018, 9:48 AM

## 2018-12-04 LAB — SURGICAL PATHOLOGY

## 2018-12-04 MED ORDER — ACETAMINOPHEN 500 MG PO TABS: 1000.0000 mg | ORAL_TABLET | Freq: Four times a day (QID) | ORAL | 0 refills | Status: DC | PRN

## 2018-12-04 MED ORDER — OXYCODONE HCL 5 MG PO TABS: 5.0000 mg | ORAL_TABLET | Freq: Four times a day (QID) | ORAL | 0 refills | Status: AC | PRN

## 2018-12-04 MED ORDER — IBUPROFEN 600 MG PO TABS: 600.0000 mg | ORAL_TABLET | Freq: Four times a day (QID) | ORAL | 3 refills | Status: DC | PRN

## 2018-12-04 MED ORDER — OMEPRAZOLE 20 MG PO CPDR: 20.0000 mg | DELAYED_RELEASE_CAPSULE | Freq: Every day | ORAL | Status: DC | PRN

## 2018-12-04 MED ORDER — VALACYCLOVIR HCL 500 MG PO TABS: 500.0000 mg | ORAL_TABLET | Freq: Two times a day (BID) | ORAL | 5 refills | Status: DC | PRN

## 2018-12-04 NOTE — Progress Notes (Signed)
POD #2 Repeat Cesarean section and BTL. Subjective:   Doing well. Wants to go home.  Tolerating a regular diet and passing flatus. Voiding without difficulty. Ambulating without assist.   Objective:  Blood pressure 126/75, pulse 76, temperature 97.6 F (36.4 C), temperature source Oral, resp. rate 20, height 4\' 11"  (1.499 m), weight 100.7 kg, last menstrual period 03/08/2018, SpO2 98 %, unknown if currently breastfeeding.  General: WF in NAD Heart: RRR without murmur Pulmonary: no increased work of breathing/ CTAB Abdomen: non-distended, non-tender, fundus firm at level of umbilicus-2FB/ML/NT Incision: Honeycomb dressing intact. ON Q intact Extremities: trace edema, no erythema, no tenderness  Results for orders placed or performed during the hospital encounter of 12/02/18 (from the past 72 hour(s))  SARS Coronavirus 2 by RT PCR (hospital order, performed in [facility] hospital lab) Nasopharyngeal Nasopharyngeal Swab     Status: None   Collection Time: 12/02/18 11:45 AM   Specimen: Nasopharyngeal Swab  Result Value Ref Range   SARS Coronavirus 2 NEGATIVE NEGATIVE    Comment: (NOTE) SARS-CoV-2 target nucleic acids are NOT DETECTED. The SARS-CoV-2 RNA is generally detectable in upper and lower respiratory specimens during the acute phase of infection. The lowest concentration of SARS-CoV-2 viral copies this assay can detect is 250 copies / mL. A negative result does not preclude SARS-CoV-2 infection and should not be used as the sole basis for treatment or other patient management decisions.  A negative result may occur with improper specimen collection / handling, submission of specimen other than nasopharyngeal swab, presence of viral mutation(s) within the areas targeted by this assay, and inadequate number of viral copies (<250 copies / mL). A negative result must be combined with clinical observations, patient history, and epidemiological information. Fact Sheet for Patients:    12/04/18 Fact Sheet for Healthcare Providers: BoilerBrush.com.cy This test is not yet approved or cleared  by the https://pope.com/ FDA and has been authorized for detection and/or diagnosis of SARS-CoV-2 by FDA under an Emergency Use Authorization (EUA).  This EUA will remain in effect (meaning this test can be used) for the duration of the COVID-19 declaration under Section 564(b)(1) of the Act, 21 U.S.C. section 360bbb-3(b)(1), unless the authorization is terminated or revoked sooner. Performed at [facility], [address] [place], [place]   Type and screen [facility]     Status: None   Collection Time: 12/02/18 11:49 AM  Result Value Ref Range   ABO/RH(D) O POS    Antibody Screen NEG    Sample Expiration      12/05/2018,2359 Performed at [facility], [address] [place], [place]   CBC     Status: None   Collection Time: 12/02/18 11:49 AM  Result Value Ref Range   WBC 7.8 4.0 - 10.5 K/uL   RBC 4.27 3.87 - 5.11 MIL/uL   Hemoglobin 12.4 12.0 - 15.0 g/dL   HCT 12/04/18 42.7 - 06.2 %   MCV 87.6 80.0 - 100.0 fL   MCH 29.0 26.0 - 34.0 pg   MCHC 33.2 30.0 - 36.0 g/dL   RDW 37.6 28.3 - 15.1 %   Platelets 200 150 - 400 K/uL   nRBC 0.0 0.0 - 0.2 %    Comment: Performed at [facility], [address] [place], [place]  RPR     Status: None   Collection Time: 12/02/18 11:49 AM  Result Value Ref Range   RPR Ser Ql NON REACTIVE NON  REACTIVE    Comment: Performed at [facility], [place] [address] [place], [place] [address]  Comprehensive metabolic panel     Status: Abnormal   Collection Time: 12/02/18 10:13 PM  Result Value Ref Range   Sodium 139 135 - 145 mmol/L   Potassium 3.8 3.5 - 5.1 mmol/L   Chloride 109 98 - 111 mmol/L   CO2 19 (L) 22 - 32 mmol/L   Glucose, Bld 111 (H) 70 - 99 mg/dL   BUN 7 6 - 20 mg/dL   Creatinine, Ser 0.64  0.44 - 1.00 mg/dL   Calcium 8.7 (L) 8.9 - 10.3 mg/dL   Total Protein 5.8 (L) 6.5 - 8.1 g/dL   Albumin 2.6 (L) 3.5 - 5.0 g/dL   AST 19 15 - 41 U/L   ALT 10 0 - 44 U/L   Alkaline Phosphatase 82 38 - 126 U/L   Total Bilirubin 0.6 0.3 - 1.2 mg/dL   GFR calc non Af Amer >60 >60 mL/min   GFR calc Af Amer >60 >60 mL/min   Anion gap 11 5 - 15    Comment: Performed at [facility], [address] [place], [place] [address]  CBC     Status: Abnormal   Collection Time: 12/03/18  5:48 AM  Result Value Ref Range   WBC 8.7 4.0 - 10.5 K/uL   RBC 3.76 (L) 3.87 - 5.11 MIL/uL   Hemoglobin 10.8 (L) 12.0 - 15.0 g/dL   HCT 33.4 (L) 36.0 - 46.0 %   MCV 88.8 80.0 - 100.0 fL   MCH 28.7 26.0 - 34.0 pg   MCHC 32.3 30.0 - 36.0 g/dL   RDW 13.2 11.5 - 15.5 %   Platelets 194 150 - 400 K/uL   nRBC 0.0 0.0 - 0.2 %    Comment: Performed at [facility], [address] [place], [place] [address]  Protein / creatinine ratio, urine     Status: Abnormal   Collection Time: 12/03/18  6:11 AM  Result Value Ref Range   Creatinine, Urine 276 mg/dL   Total Protein, Urine 49 mg/dL    Comment: NO NORMAL RANGE ESTABLISHED FOR THIS TEST   Protein Creatinine Ratio 0.18 (H) 0.00 - 0.15 mg/mg[Cre]    Comment: Performed at [facility], [address], [place], [place] [address]     Assessment:   38 y.o. [id number] postoperativeday # 2-stable  Discharge home and follow up in 1 week  Given discharge instructions  Plan:  1) Mild acute blood loss anemia - hemodynamically stable and asymptomatic - po vitamins with iron  2) O POS/ RI/ VI  3) TDAP given AP  4) Breast/Contraception-BTL  Dalia Heading, CNM

## 2018-12-04 NOTE — Lactation Note (Signed)
This note was copied from a baby's chart. Lactation Consultation Note  Patient Name: Lacey Rodgers Date: 12/04/2018 Reason for consult: Follow-up assessment;Mother's request;Difficult latch;Nipple pain/trauma  Mom reports feedings overnight to have went well. Early evening yesterday baby began cluster feeding, for approximately 4 hours, and then slept for about 7 hours overnight. Mom reporting some mild discomfort with breastfeeding on left breast. Baby is being discharged today, and mom requests assistance with next feeding to ensure everything is well. Mom called for assistance, and showed LC her right breast when has a compression stripe, and cut vertically on her nipple, along with the blister sore developed yesterday from pumping with her own personal DEBP. LC recommended feeding on opposite breast, and gave care and use instructions of comfort gels.  Baby did fight a bit at going to the breast, turning away, and trying to continuously put right hand in mouth. LC placed baby in football hold with hand tucked under the breast, and held opposite hand bringing baby in close to mom. LC hand expressed a drop of colostrum, and held tissue in teacup hold to bring baby to breast, baby latched easily, mom noting discomfort, bottom lip appeared to be tucked, pulled gently on chin and mom reported now feeling tugs and no pinching.  Reviewed with parents nipple/wound care and healing directions, alternating use of comfort gels and coconut oil, allowing nipples to air dry before putting on clothing, alternating positions with feeding to help minimize pressure on wounds. Provided guidance on achieving a deep latch: sandwich hold, alignment of baby, nose to nipple, wide open mouth, and pulling down on chin for flanged bottom lip.  Reviewed breastfeeding basics and what to expect in the days and weeks to come, newborn stomach size, growth spurts, output, newborn behaviors. Information given for  outpatient lactation support post discharge, and community breastfeeding support.  Maternal Data Formula Feeding for Exclusion: No Has patient been taught Hand Expression?: Yes Does the patient have breastfeeding experience prior to this delivery?: Yes  Feeding Feeding Type: Breast Fed  LATCH Score Latch: Grasps breast easily, tongue down, lips flanged, rhythmical sucking.  Audible Swallowing: A few with stimulation  Type of Nipple: Everted at rest and after stimulation  Comfort (Breast/Nipple): Filling, red/small blisters or bruises, mild/mod discomfort  Hold (Positioning): Assistance needed to correctly position infant at breast and maintain latch.  LATCH Score: 7  Interventions Interventions: Breast feeding basics reviewed;Assisted with latch;Hand express;Breast compression;Adjust position;Support pillows;Position options;Comfort gels;Coconut oil  Lactation Tools Discussed/Used     Consult Status Consult Status: Follow-up Date: 12/04/18 Follow-up type: In-patient    [name] 12/04/2018, 11:41 AM

## 2018-12-04 NOTE — Discharge Instructions (Signed)
Breast Pumping Tips There may be times when you cannot feed your baby from your breast, such as when you are at work or on a trip. Breast pumping allows you to remove milk from your breast in order to store for later use. There are three ways to pump. You can use:  Your hand to massage and squeeze your breast (hand expression).  A handheld manual pump.  An electric pump. When you first start to pump, you may not get much milk, but after a few days your breasts should start to make more. Pumping can help stimulate your milk supply after your baby is born. It can also help maintain your milk supply when you are away from your baby. When should I pump? You can start pumping soon after your baby is born. Here are some tips on when to pump:  When with your baby: ? Pump after breastfeeding. ? Pump from the free breast while you breastfeed.  When away from your baby: ? Pump every 2-3 hours for about 15 minutes. ? Pump both breasts at the same time if you can.  If your baby gets formula feeding, pump around the time your baby gets that feeding.  If you drank alcohol, wait 2 hours before pumping.  If you are having a procedure with anesthesia, talk to your health care provider about when you should pump before and after. How do I prepare to pump? Take steps to relax. This makes it easier to stimulate your let-down reflex, which is what makes breast milk flow. To help:  Smell one of your infant's blankets or an item of clothing.  Look at a picture or video of your infant.  Sit in a quiet, private space.  Massage your breast and nipple.  Place a warm cloth on your breast. The cloth should be a little wet.  Play relaxing music.  Picture your milk flowing. What are some tips? General tips for pumping breast milk   Always wash your hands before pumping.  If you are not getting very much milk or pumping is uncomfortable, make adjustments to your pump or try using different type of  pumps.  Drink enough fluid to keep your urine clear or pale yellow.  Wear clothing that opens in the front or allows easy access to your breasts.  Pump breast milk directly into clean bottles or other storage containers.  Do not use any products that contain nicotine or tobacco, such as cigarettes and e-cigarettes. These can lower your milk supply and harm your infant. If you need help quitting, ask your health care provider. Tips for storing breast milk   Store breast milk in a clean, BPA-free container, such as glass or plastic bottles or milk storage bags.  Store breast milk in 2-4 ounce batches to reduce waste.  Swirl the breast milk in the container to mix any cream that floats to the top. Do not shake it.  Label all stored milk with the date you pumped it.  The amount of time you can keep breast milk depends on where it is stored: ? Room temperature: 6-8 hours, if the milk is clean. It is best if used within 4 hours. ? Cooler with ice packs: 24 hours. ? Refrigerator: 5-8 days, if the milk is clean. It is best if used within 3 days. ? Freezer: 9-12 months, if the milk is clean and stored away from the freezer door. It is best if used within 6 months.  When using a  refrigerator or freezer, put the milk in the back to keep it as cold as possible.  Thaw frozen milk using warm water. Do not use the microwave. Tips for choosing a breast pump The right pump for you will depend on your comfort and how often you will be away from your baby. When choosing a pump, consider the following:  Manual breast pumps do not need electricity to work. They are usually cheaper than electric pumps, but they can be harder to use. They may be a good choice if you are occasionally away from your baby.  Electric breast pumps are usually more expensive than manual pumps, but they can be easier for some women to use. They can also collect more milk than manual pumps. This makes them a good choice for women  who work in an office or need to be away from their baby for longer periods of time.  The suction cup (flange) should be the right size. If it is the wrong size, it may cause pain and nipple damage.  Before buying a pump, find out whether your insurance covers the cost of a breast pump. Tips for maintaining a breast pump  Check your pump's manual for cleaning tips.  Clean the pump after each use. To do this: ? Wipe down the electrical unit. Use a dry, soft cloth or clean paper towel. Do not put the electrical unit in water or cleaning products. ? Wash the plastic pump parts with soap and warm water or in the dishwasher, if the parts are dishwasher safe. You do not need to clean the tubing unless it comes in contact with breast milk. Let the parts air dry. Avoid drying them with a cloth or towel. ? When the pump parts are clean and dry, put the pump back together. Then store the pump.  If there is water in the tubing when it comes time to pump, attach the tubing to the pump and turn on the pump. Run the pump until the tube is dry.  Avoid touching the inside of pump parts that come in contact with breast milk. Summary  Pumping can help stimulate your milk supply after your baby is born. It can also help maintain your milk supply when you are away from your baby.  When you are away from your infant for several hours, pump for about 15 minutes every 2-3 hours. Pump both breasts at the same time, if you can.  Your health care provider or lactation consultant can help you decide which breast pump is right for you. The right pump for you depends on your comfort, work schedule, and how often you may be away from your baby. This information is not intended to replace advice given to you by your health care provider. Make sure you discuss any questions you have with your health care provider. Document Released: 06/08/2009 Document Revised: 04/10/2018 Document Reviewed: 01/24/2016 Elsevier Patient  Education  [place]. Discharge Instructions:   Follow-up Appointment: 1 week, please call the office to schedule  If there are any new medications, they have been ordered and will be available for pickup at the listed pharmacy on your way home from the hospital.   Call the office if you have any of the following: headache, visual changes, fever >101.0 F, chills, shortness of breath, breast concerns, excessive vaginal bleeding, incision drainage or problems, leg pain or redness, depression or any other concerns. If you have vaginal discharge with an odor, let your doctor know.  It is normal to bleed for up to 6 weeks. You should not soak through more than 1 pad in 1 hour. If you have a blood clot larger than your fist with continued bleeding, call your doctor.   After a c-section, you should expect a small amount of blood or clear fluid coming from the incision and abdominal cramping/soreness. Inspect your incision site daily. Stand in front of a mirror to look for any redness, incision opening, or discolored/odorness drainage. Take a shower daily and continue good hygiene. Use own towel and washcloth (do not share). Make sure your sheets on your bed are clean. No pets sleeping around your incision site. Dressing will be removed at your postpartum visit. If the dressing does become wet or soiled underneath, it is okay to remove it before your visit.    On-Q pump: You will remove on day 4 after insertion or if the ball becomes flat before day 4. You will remove on: 12/06/2018  Activity: Do not lift > 15 lbs for 6 weeks (do not lift anything heavier than your baby). No intercourse, tampons, swimming pools, hot tubs, baths (only showers) for 6 weeks.  No driving for 1-2 weeks. Do not drive while taking narcotic or opioid pain medication.  Continue taking your prenatal vitamin, especially if breastfeeding. Increase calories and fluids (water) while breastfeeding.   Your milk will come in,  in the next couple of days (right now it is colostrum). You may have a slight fever when your milk comes in, but it should go away on its own.  If it does not, and rises above 101 F please call the doctor. You will also feel achy and your breasts will be firm. They will also start to leak. If you are breastfeeding, continue as you have been and you can pump/express milk for comfort.   If you have too much milk, your breasts can become engorged, which could lead to mastitis. This is an infection of the milk ducts. It can be very painful and you will need to notify your doctor to obtain a prescription for antibiotics. You can also treat it with a shower or hot/cold compress.   For concerns about your baby, please call your pediatrician.  For breastfeeding concerns, the lactation consultant can be reached at [phone].   Postpartum blues (feelings of happy one minute and sad another minute) are normal for the first few weeks but if it gets worse let your doctor know.   Congratulations! We enjoyed caring for you and your new bundle of joy!

## 2018-12-04 NOTE — Progress Notes (Signed)
DC teaching completed.  Mom verb u/o and has f/u appoint for incision check.

## 2018-12-04 NOTE — Progress Notes (Signed)
DC to home to car via wc with nursing staff.  Hold baby in lap

## 2018-12-04 NOTE — Progress Notes (Signed)
CSW received consult due to score 12 on Edinburgh Depression Screen.    CSW spoke with MOB at bedside to address further needs. Upon entering the room CSW congratulated MOB on the birth of infant. CSW observed that another guest was in room holding infant. CSW asked if this was FOB and MOB reported that it was. CSW advised MOB of HIPPA policy and MOB agreeable to FOB remaining in room. CSW advised MOB of CSW's role and the reason for CSW coming to visit with her. MOB reported that the last 7 days she has been dealing with a lot from being out of work, to [employer] temporary custody of her brothers children. Per MOB, she has just been dealing with things through this time. CSW understanding and offered Mob support. MOB reported that she does have a history of anxiety where she was placed on Lexapro for it. MOB reports that this has been very helpful for her, since restarting this medication. CSW offered MOB therapy resources in which MOB declined at this time. MOB reported no feelings of SI or HI since giving birth.   MOB reports that infant will get follow up care at [facility] and will sleep in basinet once arrived home.   CSW provided education regarding Baby Blues vs PMADs and provided MOB with resources for mental health follow up.  CSW encouraged MOB to evaluate her mental health throughout the postpartum period with the use of the New Mom Checklist developed by [employer]partum [employer] as well as the [place] Postnatal Depression Scale and notify a medical professional if symptoms arise.       Lacey Rodgers, MSW, LCSW [facility] [place] [facility] [place] [phone]

## 2018-12-04 NOTE — Progress Notes (Signed)
wnl

## 2018-12-05 ENCOUNTER — Other Ambulatory Visit: Admission: RE | Admit: 2018-12-05 | Payer: No Typology Code available for payment source | Source: Ambulatory Visit

## 2018-12-06 ENCOUNTER — Other Ambulatory Visit: Payer: No Typology Code available for payment source

## 2018-12-10 ENCOUNTER — Inpatient Hospital Stay: Admit: 2018-12-10 | Payer: No Typology Code available for payment source | Admitting: Obstetrics and Gynecology

## 2018-12-10 SURGERY — Surgical Case
Anesthesia: Choice

## 2018-12-11 ENCOUNTER — Ambulatory Visit (INDEPENDENT_AMBULATORY_CARE_PROVIDER_SITE_OTHER): Payer: No Typology Code available for payment source | Admitting: Obstetrics and Gynecology

## 2018-12-11 ENCOUNTER — Other Ambulatory Visit: Payer: Self-pay

## 2018-12-11 ENCOUNTER — Encounter: Payer: Self-pay | Admitting: Obstetrics and Gynecology

## 2018-12-11 DIAGNOSIS — Z98891 History of uterine scar from previous surgery: Secondary | ICD-10-CM

## 2018-12-11 NOTE — Progress Notes (Signed)
  OBSTETRICS POSTPARTUM CLINIC PROGRESS NOTE  Subjective:     Lacey Rodgers is a 38 y.o. [phone] female who presents for a postpartum visit. She is 1 week postpartum following a Term pregnancy and delivery by C-section.  I have fully reviewed the prenatal and intrapartum course. Anesthesia: spinal.  Postpartum course has been complicated by uncomplicated.  Baby is feeding by Breast.  Bleeding: patient has not  resumed menses.  Bowel function is normal. Bladder function is normal.  Patient is not sexually active. Contraception method desired is none.  Postpartum depression screening: reports stable mood  The following portions of the patient's history were reviewed and updated as appropriate: allergies, current medications, past family history, past medical history, past social history, past surgical history and problem list.  Review of Systems Pertinent items are noted in HPI.  Objective:    BP 130/82   Temp (!) 96.7 F (35.9 C)   Ht 4\' 11"  (1.499 m)   Wt 208 lb (94.3 kg)   LMP 03/08/2018   Breastfeeding Yes   BMI 42.01 kg/m   General:  alert and no distress   Breasts:  inspection negative, no nipple discharge or bleeding, no masses or nodularity palpable  Lungs: clear to auscultation bilaterally  Heart:  regular rate and rhythm, S1, S2 normal, no murmur, click, rub or gallop  Abdomen: soft, non-tender; bowel sounds normal; no masses,  no organomegaly.   Well healed Pfannenstiel incision   Vulva:  normal  Vagina: normal vagina, no discharge, exudate, lesion, or erythema  Cervix:  no cervical motion tenderness and no lesions  Corpus: normal size, contour, position, consistency, mobility, non-tender  Adnexa:  normal adnexa and no mass, fullness, tenderness  Rectal Exam: Not performed.          Assessment:  Post Partum Care visit There are no diagnoses linked to this encounter.  Plan:  See orders and Patient Instructions Follow up in: 5 weeks or as needed.    Adrian Prows MD [facility], [facility] 12/11/2018 3:41 PM

## 2018-12-13 ENCOUNTER — Other Ambulatory Visit: Payer: Self-pay

## 2018-12-13 DIAGNOSIS — Z20822 Contact with and (suspected) exposure to covid-19: Secondary | ICD-10-CM

## 2018-12-14 LAB — NOVEL CORONAVIRUS, NAA: SARS-CoV-2, NAA: NOT DETECTED

## 2019-01-16 ENCOUNTER — Telehealth: Payer: Self-pay | Admitting: Obstetrics and Gynecology

## 2019-01-16 ENCOUNTER — Ambulatory Visit: Payer: No Typology Code available for payment source | Admitting: Obstetrics and Gynecology

## 2019-01-16 NOTE — Telephone Encounter (Signed)
Patient had to r/s her 6 wk PP from today due to child care issues.  She is rescheduled to 2/10 but she is needing a return to work note, to return 2/8.

## 2019-01-17 ENCOUNTER — Encounter: Payer: Self-pay | Admitting: Obstetrics and Gynecology

## 2019-01-17 NOTE — Telephone Encounter (Signed)
Note placed in pts chart and sent through mychart.

## 2019-02-12 ENCOUNTER — Ambulatory Visit: Payer: No Typology Code available for payment source | Admitting: Obstetrics and Gynecology

## 2019-02-25 ENCOUNTER — Telehealth: Payer: No Typology Code available for payment source | Admitting: Physician Assistant

## 2019-02-25 DIAGNOSIS — H9202 Otalgia, left ear: Secondary | ICD-10-CM | POA: Diagnosis not present

## 2019-02-25 DIAGNOSIS — J029 Acute pharyngitis, unspecified: Secondary | ICD-10-CM

## 2019-02-25 MED ORDER — AMOXICILLIN 500 MG PO CAPS: 500.0000 mg | ORAL_CAPSULE | Freq: Two times a day (BID) | ORAL | 0 refills | Status: DC

## 2019-02-25 NOTE — Progress Notes (Signed)
We are sorry that you are not feeling well.  Here is how we plan to help!  Based on what you have shared with me it is likely that you have strep pharyngitis.  Strep pharyngitis is inflammation and infection in the back of the throat.  This is an infection cause by bacteria and is treated with antibiotics.  I have prescribed Amoxicillin 500 mg twice a day for 10 days. For throat pain, we recommend over the counter oral pain relief medications such as acetaminophen or aspirin, or anti-inflammatory medications such as ibuprofen or naproxen sodium. Topical treatments such as oral throat lozenges or sprays may be used as needed. Strep infections are not as easily transmitted as other respiratory infections, however we still recommend that you avoid close contact with loved ones, especially the very young and elderly.  Remember to wash your hands thoroughly throughout the day as this is the number one way to prevent the spread of infection and wipe down door knobs and counters with disinfectant.   Home Care:  Only take medications as instructed by your medical team.  Complete the entire course of an antibiotic.  Do not take these medications with alcohol.  A steam or ultrasonic humidifier can help congestion.  You can place a towel over your head and breathe in the steam from hot water coming from a faucet.  Avoid close contacts especially the very young and the elderly.  Cover your mouth when you cough or sneeze.  Always remember to wash your hands.  Get Help Right Away If:  You develop worsening fever or sinus pain.  You develop a severe head ache or visual changes.  Your symptoms persist after you have completed your treatment plan.  Make sure you  Understand these instructions.  Will watch your condition.  Will get help right away if you are not doing well or get worse.  Your e-visit answers were reviewed by a board certified advanced clinical practitioner to complete your  personal care plan.  Depending on the condition, your plan could have included both over the counter or prescription medications.  If there is a problem please reply  once you have received a response from your provider.  Your safety is important to us.  If you have drug allergies check your prescription carefully.    You can use MyChart to ask questions about today's visit, request a non-urgent call back, or ask for a work or school excuse for 24 hours related to this e-Visit. If it has been greater than 24 hours you will need to follow up with your provider, or enter a new e-Visit to address those concerns.  You will get an e-mail in the next two days asking about your experience.  I hope that your e-visit has been valuable and will speed your recovery. Thank you for using e-visits.  Greater than 5 minutes, yet less than 10 minutes of time have been spent researching, coordinating, and implementing care for this patient today  

## 2019-03-03 ENCOUNTER — Ambulatory Visit (INDEPENDENT_AMBULATORY_CARE_PROVIDER_SITE_OTHER): Payer: No Typology Code available for payment source | Admitting: Obstetrics and Gynecology

## 2019-03-03 ENCOUNTER — Other Ambulatory Visit: Payer: Self-pay

## 2019-03-03 ENCOUNTER — Encounter: Payer: Self-pay | Admitting: Obstetrics and Gynecology

## 2019-03-03 ENCOUNTER — Other Ambulatory Visit: Payer: Self-pay | Admitting: Obstetrics and Gynecology

## 2019-03-03 ENCOUNTER — Other Ambulatory Visit (HOSPITAL_COMMUNITY)
Admission: RE | Admit: 2019-03-03 | Discharge: 2019-03-03 | Disposition: A | Discharge status: Home / Self Care | Payer: No Typology Code available for payment source | Source: Ambulatory Visit | Attending: Obstetrics and Gynecology | Admitting: Obstetrics and Gynecology

## 2019-03-03 DIAGNOSIS — Z124 Encounter for screening for malignant neoplasm of cervix: Secondary | ICD-10-CM

## 2019-03-03 DIAGNOSIS — F419 Anxiety disorder, unspecified: Secondary | ICD-10-CM

## 2019-03-03 DIAGNOSIS — Z98891 History of uterine scar from previous surgery: Secondary | ICD-10-CM

## 2019-03-03 DIAGNOSIS — F329 Major depressive disorder, single episode, unspecified: Secondary | ICD-10-CM

## 2019-03-03 MED ORDER — ESCITALOPRAM OXALATE 20 MG PO TABS: 20.0000 mg | ORAL_TABLET | Freq: Every day | ORAL | 6 refills | Status: DC

## 2019-03-03 NOTE — Patient Instructions (Signed)
Managing Anxiety, Adult After being diagnosed with an anxiety disorder, you may be relieved to know why you have felt or behaved a certain way. You may also feel overwhelmed about the treatment ahead and what it will mean for your life. With care and support, you can manage this condition and recover from it. How to manage lifestyle changes Managing stress and anxiety  Stress is your body's reaction to life changes and events, both good and bad. Most stress will last just a few hours, but stress can be ongoing and can lead to more than just stress. Although stress can play a major role in anxiety, it is not the same as anxiety. Stress is usually caused by something external, such as a deadline, test, or competition. Stress normally passes after the triggering event has ended.  Anxiety is caused by something internal, such as imagining a terrible outcome or worrying that something will go wrong that will devastate you. Anxiety often does not go away even after the triggering event is over, and it can become long-term (chronic) worry. It is important to understand the differences between stress and anxiety and to manage your stress effectively so that it does not lead to an anxious response. Talk with your health care provider or a counselor to learn more about reducing anxiety and stress. He or she may suggest tension reduction techniques, such as:  Music therapy. This can include creating or listening to music that you enjoy and that inspires you.  Mindfulness-based meditation. This involves being aware of your normal breaths while not trying to control your breathing. It can be done while sitting or walking.  Centering prayer. This involves focusing on a word, phrase, or sacred image that means something to you and brings you peace.  Deep breathing. To do this, expand your stomach and inhale slowly through your nose. Hold your breath for 3-5 seconds. Then exhale slowly, letting your stomach muscles  relax.  Self-talk. This involves identifying thought patterns that lead to anxiety reactions and changing those patterns.  Muscle relaxation. This involves tensing muscles and then relaxing them. Choose a tension reduction technique that suits your lifestyle and personality. These techniques take time and practice. Set aside 5-15 minutes a day to do them. Therapists can offer counseling and training in these techniques. The training to help with anxiety may be covered by some insurance plans. Other things you can do to manage stress and anxiety include:  Keeping a stress/anxiety diary. This can help you learn what triggers your reaction and then learn ways to manage your response.  Thinking about how you react to certain situations. You may not be able to control everything, but you can control your response.  Making time for activities that help you relax and not feeling guilty about spending your time in this way.  Visual imagery and yoga can help you stay calm and relax.  Medicines Medicines can help ease symptoms. Medicines for anxiety include:  Anti-anxiety drugs.  Antidepressants. Medicines are often used as a primary treatment for anxiety disorder. Medicines will be prescribed by a health care provider. When used together, medicines, psychotherapy, and tension reduction techniques may be the most effective treatment. Relationships Relationships can play a big part in helping you recover. Try to spend more time connecting with trusted friends and family members. Consider going to couples counseling, taking family education classes, or going to family therapy. Therapy can help you and others better understand your condition. How to recognize changes in your   anxiety Everyone responds differently to treatment for anxiety. Recovery from anxiety happens when symptoms decrease and stop interfering with your daily activities at home or work. This may mean that you will start to:  Have  better concentration and focus. Worry will interfere less in your daily thinking.  Sleep better.  Be less irritable.  Have more energy.  Have improved memory. It is important to recognize when your condition is getting worse. Contact your health care provider if your symptoms interfere with home or work and you feel like your condition is not improving. Follow these instructions at home: Activity  Exercise. Most adults should do the following: ? Exercise for at least 150 minutes each week. The exercise should increase your heart rate and make you sweat (moderate-intensity exercise). ? Strengthening exercises at least twice a week.  Get the right amount and quality of sleep. Most adults need 7-9 hours of sleep each night. Lifestyle   Eat a healthy diet that includes plenty of vegetables, fruits, whole grains, low-fat dairy products, and lean protein. Do not eat a lot of foods that are high in solid fats, added sugars, or salt.  Make choices that simplify your life.  Do not use any products that contain nicotine or tobacco, such as cigarettes, e-cigarettes, and chewing tobacco. If you need help quitting, ask your health care provider.  Avoid caffeine, alcohol, and certain over-the-counter cold medicines. These may make you feel worse. Ask your pharmacist which medicines to avoid. General instructions  Take over-the-counter and prescription medicines only as told by your health care provider.  Keep all follow-up visits as told by your health care provider. This is important. Where to find support You can get help and support from these sources:  Self-help groups.  Online and community organizations.  A trusted spiritual leader.  Couples counseling.  Family education classes.  Family therapy. Where to find more information You may find that joining a support group helps you deal with your anxiety. The following sources can help you locate counselors or support groups near  you:  Mental Health America: www.mentalhealthamerica.net  Anxiety and Depression Association of America (ADAA): www.adaa.org  National Alliance on Mental Illness (NAMI): www.nami.org Contact a health care provider if you:  Have a hard time staying focused or finishing daily tasks.  Spend many hours a day feeling worried about everyday life.  Become exhausted by worry.  Start to have headaches, feel tense, or have nausea.  Urinate more than normal.  Have diarrhea. Get help right away if you have:  A racing heart and shortness of breath.  Thoughts of hurting yourself or others. If you ever feel like you may hurt yourself or others, or have thoughts about taking your own life, get help right away. You can go to your nearest emergency department or call:  Your local emergency services (911 in the [place]  A suicide crisis helpline, such as the National Suicide Prevention Lifeline at 1-800-273-8255. This is open 24 hours a day. Summary  Taking steps to learn and use tension reduction techniques can help calm you and help prevent triggering an anxiety reaction.  When used together, medicines, psychotherapy, and tension reduction techniques may be the most effective treatment.  Family, friends, and partners can play a big part in helping you recover from an anxiety disorder. This information is not intended to replace advice given to you by your health care provider. Make sure you discuss any questions you have with your health care provider. Document Revised:   05/21/2018 Document Reviewed: 05/21/2018 Elsevier Patient Education  2020 [employer].    [facility] [place] Same Day Access Hours:  Monday, Wednesday and Friday, 8am - 3pm Walk-In Crisis Hours: 7 days/wk, 8am - 8pm [address] [place], [place] [address] [phone]  [employer] [phone]  Mobile Crisis Unit [phone]   Therapists/Counselors/Psychologists   Karen  [place], [place]  & Jacqlyn Krauss [place]    [phone]        [address]       [place], [place] [address]        Ival Bible, CSW [phone] [address] [place], [place] [address]  Harle Battiest, [place]        [phone]        [address]      [place], [place] [address]        Chyrel Masson, MS [phone] [address] [place], [place] [address]   Oscar La, LMFT       [phone]        [address]       [place], [place] [address]        Felecia Jan [phone] [address] [place], [place] [address]  Lester Chamisal        [phone]        [address]       [place], [place] [address]        Kerin Salen [phone] [address] [place], [place] [address]  Tyron Russell, PsyD       [phone]        [address]       [place], [place] [address]        Elita Quick, LPC [phone] [address]  [place], [place] [address]   Debarah Crape        [phone]        [address] [place], [place] [address]         Rosana Hoes [facility] [phone] [email or web address]   Sation Konchella       [phone]        [address]       [place], [place] [address]        Morton Stall [facility] [phone] [email or web address]

## 2019-03-03 NOTE — Progress Notes (Signed)
  OBSTETRICS POSTPARTUM CLINIC PROGRESS NOTE  Subjective:     Lacey Rodgers is a 39 y.o. [phone] female who presents for a postpartum visit. She is several weeks postpartum following a Term pregnancy and delivery by C-section repeat; no problems after deliver.  I have fully reviewed the prenatal and intrapartum course. Anesthesia: spinal.  Postpartum course has been complicated by uncomplicated.  Baby is feeding by Bottle.  Bleeding: patient has  resumed menses.  Bowel function is normal. Bladder function is normal.  Patient is sexually active. Contraception method desired is tubal ligation.  Postpartum depression screening: positive. Edinburgh 11.  The following portions of the patient's history were reviewed and updated as appropriate: allergies, current medications, past family history, past medical history, past social history, past surgical history and problem list.  Review of Systems Pertinent items are noted in HPI.  Objective:    BP 130/80   Ht 4\' 11"  (1.499 m)   Wt 234 lb (106.1 kg)   LMP 03/03/2019 (Exact Date)   Breastfeeding No   BMI 47.26 kg/m   General:  alert and no distress   Breasts:  inspection negative, no nipple discharge or bleeding, no masses or nodularity palpable  Lungs: clear to auscultation bilaterally  Heart:  regular rate and rhythm, S1, S2 normal, no murmur, click, rub or gallop  Abdomen: soft, non-tender; bowel sounds normal; no masses,  no organomegaly.   Well healed Pfannenstiel incision   Vulva:  normal  Vagina: normal vagina, no discharge, exudate, lesion, or erythema  Cervix:  no cervical motion tenderness and no lesions  Corpus: normal size, contour, position, consistency, mobility, non-tender  Adnexa:  normal adnexa and no mass, fullness, tenderness  Rectal Exam: Not performed.          Assessment:  Post Partum Care visit 1. S/P cesarean section Incision is healing well  2. Postpartum care and examination  3. Cervical cancer  screening - Cytology - PAP today  4. Anxiety and depression Increased Lexapro to 20 mg - Ambulatory referral to Psychiatry - Given list for local therapists   Plan:  See orders and Patient Instructions Follow up in: 12 months or as needed.   05/03/2019 MD [facility], [facility] 03/03/2019 5:11 PM

## 2019-03-07 LAB — CYTOLOGY - PAP
Comment: NEGATIVE
Diagnosis: NEGATIVE
High risk HPV: NEGATIVE

## 2019-03-07 NOTE — Progress Notes (Signed)
Please call patient with normal pap result.

## 2019-04-21 ENCOUNTER — Other Ambulatory Visit: Payer: Self-pay | Admitting: Obstetrics & Gynecology

## 2019-07-01 ENCOUNTER — Telehealth: Payer: Self-pay | Admitting: Family Medicine

## 2019-07-01 MED ORDER — LABETALOL HCL 100 MG PO TABS: 100.0000 mg | ORAL_TABLET | Freq: Two times a day (BID) | ORAL | 0 refills | Status: DC

## 2019-07-01 NOTE — Telephone Encounter (Signed)
Patient called to schedule her annual physical She stated she needed an appointment before she could get a refill on labetalol (NORMODYNE) 100 MG tablet.  Patient wanted to see if she could get a refill so she will have enough to make it to her appt 7/21

## 2019-07-01 NOTE — Telephone Encounter (Addendum)
Refill sent as requested.  Left message for [place] that I sent her a refill in to [facility].

## 2019-07-01 NOTE — Addendum Note (Signed)
Addended by: Damita Lack on: 07/01/2019 02:12 PM   Modules accepted: Orders

## 2019-07-06 ENCOUNTER — Ambulatory Visit (INDEPENDENT_AMBULATORY_CARE_PROVIDER_SITE_OTHER): Payer: No Typology Code available for payment source

## 2019-07-06 ENCOUNTER — Other Ambulatory Visit: Payer: Self-pay

## 2019-07-06 ENCOUNTER — Ambulatory Visit
Admission: EM | Admit: 2019-07-06 | Discharge: 2019-07-06 | Disposition: A | Discharge status: Home / Self Care | Payer: No Typology Code available for payment source | Attending: Family Medicine | Admitting: Family Medicine

## 2019-07-06 DIAGNOSIS — S161XXA Strain of muscle, fascia and tendon at neck level, initial encounter: Secondary | ICD-10-CM

## 2019-07-06 DIAGNOSIS — M542 Cervicalgia: Secondary | ICD-10-CM

## 2019-07-06 MED ORDER — MELOXICAM 15 MG PO TABS: 15.0000 mg | ORAL_TABLET | Freq: Every day | ORAL | 0 refills | Status: DC

## 2019-07-06 MED ORDER — CYCLOBENZAPRINE HCL 10 MG PO TABS: 10.0000 mg | ORAL_TABLET | Freq: Every day | ORAL | 0 refills | Status: DC

## 2019-07-06 NOTE — Discharge Instructions (Addendum)
Heat to area, stretching

## 2019-07-06 NOTE — ED Triage Notes (Signed)
Pt states for past few months she has had neck pain. Last few days much worse and difficulty turning to the left.

## 2019-07-06 NOTE — ED Provider Notes (Signed)
[facility]    CSN: [id number] Arrival date & time: 07/06/19  1355      History   Chief Complaint Chief Complaint  Patient presents with  . Neck Pain    HPI Lacey Rodgers is a 39 y.o. female.   39 yo female with a c/o left sided neck pain for the past several (2-3 months). Does not recall any specific injury. States sometimes feels numbness and tingling down her arms. Neck pain is worse when turning her neck.    Neck Pain   Past Medical History:  Diagnosis Date  . Depression   . Genital herpes   . GERD (gastroesophageal reflux disease)   . Hypertension   . Migraine   . Obesity affecting pregnancy     Patient Active Problem List   Diagnosis Date Noted  . Delivery by cesarean section at 37-39 weeks of gestation due to labor 12/04/2018  . Postpartum care following cesarean delivery 12/04/2018  . Indication for care in labor and delivery, antepartum 12/02/2018  . Previous cesarean section complicating pregnancy 12/02/2018  . Supervision of high risk pregnancy, antepartum 05/22/2018  . Obesity affecting pregnancy 05/22/2018  . BMI 37.0-37.9, adult 05/22/2018  . Chronic hypertension affecting pregnancy 05/15/2018  . History of cesarean delivery, currently pregnant 04/17/2018  . Cough 03/15/2018  . Infected pierced ear 10/24/2017  . Skin rash 05/25/2017  . Tendinitis of left rotator cuff 03/08/2017  . Moderate recurrent major depression (HCC) 02/08/2017  . Migraine aura, persistent 10/10/2012  . GENITAL HERPES 10/23/2008  . COMMON MIGRAINE 10/23/2008  . Essential hypertension 10/23/2008  . GERD 10/23/2008    Past Surgical History:  Procedure Laterality Date  . CESAREAN SECTION N/A 12/16/2015   Procedure: CESAREAN SECTION;  Surgeon: Conard Novak, MD;  Location: [facility];  Service: Obstetrics;  Laterality: N/A;  . CESAREAN SECTION WITH BILATERAL TUBAL LIGATION  12/02/2018   Procedure: CESAREAN SECTION WITH BILATERAL TUBAL LIGATION;  Surgeon:  Natale Milch, MD;  Location: [facility] OR[facility];  Service: Obstetrics;;  TOB 1850 WEIGHT 7lb 5oz LENGTH 19.75 APGAR 9/9  . DILATION AND CURETTAGE OF UTERUS  01/2013    OB History    Gravida  4   Para  2   Term  2   Preterm  0   AB  2   Living  2     SAB      TAB      Ectopic      Multiple  0   Live Births  2            Home Medications    Prior to Admission medications   Medication Sig Start Date End Date Taking? Authorizing Provider  cyclobenzaprine (FLEXERIL) 10 MG tablet Take 1 tablet (10 mg total) by mouth at bedtime. 07/06/19   Payton Mccallum, MD  escitalopram (LEXAPRO) 20 MG tablet Take 1 tablet (20 mg total) by mouth daily. 03/03/19   Schuman, Jaquelyn Bitter, MD  ibuprofen (ADVIL) 600 MG tablet Take 1 tablet (600 mg total) by mouth every 6 (six) hours as needed. 12/04/18   Farrel Conners, CNM  labetalol (NORMODYNE) 100 MG tablet Take 1 tablet (100 mg total) by mouth 2 (two) times daily. 07/01/19   Copland, Karleen Hampshire, MD  meloxicam (MOBIC) 15 MG tablet Take 1 tablet (15 mg total) by mouth daily. 07/06/19   Payton Mccallum, MD  omeprazole (PRILOSEC) 20 MG capsule Take 1 capsule (20 mg total) by mouth daily as needed. 12/04/18   Sharen Hones,  Colleen, CNM  valACYclovir (VALTREX) 500 MG tablet Take 1 tablet (500 mg total) by mouth 2 (two) times daily as needed. 12/04/18   Farrel Conners, CNM    Family History Family History  Problem Relation Age of Onset  . Hypertension Mother   . Hypertension Father   . Hypertension Brother   . Cancer Brother 13  . Hypertension Maternal Grandmother   . Hypertension Maternal Grandfather   . Hypertension Paternal Grandmother   . Hypertension Paternal Grandfather     Social History Social History   Tobacco Use  . Smoking status: Never Smoker  . Smokeless tobacco: Never Used  Vaping Use  . Vaping Use: Never used  Substance Use Topics  . Alcohol use: Yes    Alcohol/week: 0.0 standard drinks    Comment: occ  . Drug use:  No     Allergies   Pseudoeph-doxylamine-dm-apap   Review of Systems Review of Systems  Musculoskeletal: Positive for neck pain.     Physical Exam Triage Vital Signs ED Triage Vitals  Enc Vitals Group     BP 07/06/19 1405 (!) 157/89     Pulse Rate 07/06/19 1405 87     Resp 07/06/19 1405 16     Temp 07/06/19 1405 97.9 F (36.6 C)     Temp Source 07/06/19 1405 Oral     SpO2 07/06/19 1405 98 %     Weight 07/06/19 1403 227 lb (103 kg)     Height 07/06/19 1403 4\' 11"  (1.499 m)     Head Circumference --      Peak Flow --      Pain Score 07/06/19 1403 8     Pain Loc --      Pain Edu? --      Excl. in GC? --    No data found.  Updated Vital Signs BP (!) 157/89 (BP Location: Left Arm)   Pulse 87   Temp 97.9 F (36.6 C) (Oral)   Resp 16   Ht 4\' 11"  (1.499 m)   Wt 103 kg   LMP 06/22/2019   SpO2 98%   BMI 45.85 kg/m   Visual Acuity Right Eye Distance:   Left Eye Distance:   Bilateral Distance:    Right Eye Near:   Left Eye Near:    Bilateral Near:     Physical Exam Vitals and nursing note reviewed.  Constitutional:      General: She is not in acute distress.    Appearance: She is not toxic-appearing or diaphoretic.  Cardiovascular:     Rate and Rhythm: Normal rate.  Musculoskeletal:     Cervical back: Normal range of motion and neck supple. Spasms and tenderness present. No swelling, edema, deformity, erythema, signs of trauma, lacerations or rigidity. Pain with movement present.     Comments: Tenderness over the left trapezius muscle  Lymphadenopathy:     Cervical: No cervical adenopathy.  Neurological:     Mental Status: She is alert.      UC Treatments / Results  Labs (all labs ordered are listed, but only abnormal results are displayed) Labs Reviewed - No data to display  EKG   Radiology DG Cervical Spine Complete  Result Date: 07/06/2019 CLINICAL DATA:  Neck pain, getting worse EXAM: CERVICAL SPINE - COMPLETE 4+ VIEW COMPARISON:  None.  FINDINGS: There is no evidence of cervical spine fracture or prevertebral soft tissue swelling. Alignment is normal. No other significant bone abnormalities are identified. IMPRESSION: Negative cervical spine radiographs. Electronically  Signed   By: Judie Petit.  Shick M.D.   On: 07/06/2019 14:29    Procedures Procedures (including critical care time)  Medications Ordered in UC Medications - No data to display  Initial Impression / Assessment and Plan / UC Course  I have reviewed the triage vital signs and the nursing notes.  Pertinent labs & imaging results that were available during my care of the patient were reviewed by me and considered in my medical decision making (see chart for details).      Final Clinical Impressions(s) / UC Diagnoses   Final diagnoses:  Strain of neck muscle, initial encounter  Neck pain     Discharge Instructions     Heat to area, stretching    ED Prescriptions    Medication Sig Dispense Auth. Provider   cyclobenzaprine (FLEXERIL) 10 MG tablet Take 1 tablet (10 mg total) by mouth at bedtime. 30 tablet Payton Mccallum, MD   meloxicam (MOBIC) 15 MG tablet Take 1 tablet (15 mg total) by mouth daily. 30 tablet Payton Mccallum, MD      1. x-ray results and diagnosis reviewed with patient 2. rx as per orders above; reviewed possible side effects, interactions, risks and benefits  3. Recommend supportive treatment as above  4. Follow-up prn if symptoms worsen or don't improve   PDMP not reviewed this encounter.   Payton Mccallum, MD 07/06/19 [phone]

## 2019-07-23 ENCOUNTER — Other Ambulatory Visit: Payer: Self-pay

## 2019-07-23 ENCOUNTER — Encounter: Payer: Self-pay | Admitting: Family Medicine

## 2019-07-23 ENCOUNTER — Ambulatory Visit (INDEPENDENT_AMBULATORY_CARE_PROVIDER_SITE_OTHER): Payer: No Typology Code available for payment source | Admitting: Family Medicine

## 2019-07-23 VITALS — BP 118/80 | HR 89 | Temp 97.3°F | Ht 59.5 in | Wt 223.5 lb

## 2019-07-23 DIAGNOSIS — Z131 Encounter for screening for diabetes mellitus: Secondary | ICD-10-CM | POA: Diagnosis not present

## 2019-07-23 DIAGNOSIS — Z13220 Encounter for screening for lipoid disorders: Secondary | ICD-10-CM | POA: Diagnosis not present

## 2019-07-23 DIAGNOSIS — R5383 Other fatigue: Secondary | ICD-10-CM | POA: Diagnosis not present

## 2019-07-23 DIAGNOSIS — Z0000 Encounter for general adult medical examination without abnormal findings: Secondary | ICD-10-CM | POA: Diagnosis not present

## 2019-07-23 NOTE — Progress Notes (Signed)
Lacey Brahmbhatt T. Rollins Wrightson, MD, [place] at [facility] [place], [address]  Phone: [phone]  FAX: [phone]  Lacey Rodgers - 39 y.o. female  MRN [id number]  Date of Birth: 03/05/1980  Date: 07/23/2019  PCP: Owens Loffler, MD  Referral: Owens Loffler, MD  Chief Complaint  Patient presents with  . Annual Exam    This visit occurred during the SARS-CoV-2 public health emergency.  Safety protocols were in place, including screening questions prior to the visit, additional usage of staff PPE, and extensive cleaning of exam room while observing appropriate contact time as indicated for disinfecting solutions.   Patient Care Team: Owens Loffler, MD as PCP - General Subjective:   Lacey Rodgers is a 39 y.o. pleasant patient who presents with the following:  Health Maintenance Summary Reviewed and updated, unless pt declines services.  Tobacco History Reviewed. Non-smoker Alcohol: No concerns, no excessive use Exercise Habits:  plays and walks the track STD concerns: none Drug Use: None Lumps or breast concerns: no  Covid done  Check Hep C  Neck check  Wt Readings from Last 3 Encounters:  07/23/19 223 lb 8 oz (101.4 kg)  07/06/19 227 lb (103 kg)  03/03/19 234 lb (106.1 kg)    199 Pre birth 221  AM, atkins shakes or premier shakes Snack - greek yogurt Keto meals Dinner - a Optometrist and will do a carb balance wrap    Health Maintenance  Topic Date Due  . Hepatitis C Screening  Never done  . COVID-19 Vaccine (1) Never done  . INFLUENZA VACCINE  08/03/2019  . PAP SMEAR-Modifier  03/03/2022  . TETANUS/TDAP  10/14/2028  . HIV Screening  Completed    Immunization History  Administered Date(s) Administered  . Influenza,inj,Quad PF,6+ Mos 11/12/2013, 10/16/2017  . MMR 12/19/2015  . Tdap 10/28/2015, 10/15/2018   Patient Active Problem List   Diagnosis Date  Noted  . BMI 40.0-44.9, adult ([place]) 05/22/2018  . Moderate recurrent major depression ([place]) 02/08/2017  . Migraine aura, persistent 10/10/2012  . GENITAL HERPES 10/23/2008  . COMMON MIGRAINE 10/23/2008  . Essential hypertension 10/23/2008  . GERD 10/23/2008    Past Medical History:  Diagnosis Date  . Depression   . Genital herpes   . GERD (gastroesophageal reflux disease)   . Hypertension   . Migraine   . Obesity affecting pregnancy     Past Surgical History:  Procedure Laterality Date  . CESAREAN SECTION N/A 12/16/2015   Procedure: CESAREAN SECTION;  Surgeon: Will Bonnet, MD;  Location: [facility] ORS;  Service: Obstetrics;  Laterality: N/A;  . CESAREAN SECTION WITH BILATERAL TUBAL LIGATION  12/02/2018   Procedure: CESAREAN SECTION WITH BILATERAL TUBAL LIGATION;  Surgeon: Homero Fellers, MD;  Location: [facility] ORS;  Service: Obstetrics;;  TOB 1850 WEIGHT 7lb 5oz LENGTH 19.75 APGAR 9/9  . DILATION AND CURETTAGE OF UTERUS  01/2013    Family History  Problem Relation Age of Onset  . Hypertension Mother   . Hypertension Father   . Hypertension Brother   . Cancer Brother 51  . Hypertension Maternal Grandmother   . Hypertension Maternal Grandfather   . Hypertension Paternal Grandmother   . Hypertension Paternal Grandfather     Past Medical History, Surgical History, Social History, Family History, Problem List, Medications, and Allergies have been reviewed and updated if relevant.  Review of Systems: Pertinent positives are listed above.  Otherwise, a full 14  point review of systems has been done in full and it is negative except where it is noted positive.  Objective:   BP 118/80   Pulse 89   Temp (!) 97.3 F (36.3 C) (Temporal)   Ht 4' 11.5" (1.511 m)   Wt 223 lb 8 oz (101.4 kg)   LMP 07/23/2019   SpO2 96%   BMI 44.39 kg/m  Ideal Body Weight: Weight in (lb) to have BMI = 25: 125.6 No exam data present Depression screen [facility] 2/9 03/08/2017 02/08/2017    Decreased Interest 2 2  Down, Depressed, Hopeless 2 3  PHQ - 2 Score 4 5  Altered sleeping 2 3  Tired, decreased energy 3 3  Change in appetite 2 3  Feeling bad or failure about yourself  1 2  Trouble concentrating 3 3  Moving slowly or fidgety/restless 0 2  Suicidal thoughts 0 0  PHQ-9 Score 15 21  Difficult doing work/chores Somewhat difficult Very difficult     GEN: well developed, well nourished, no acute distress Eyes: conjunctiva and lids normal, PERRLA, EOMI ENT: TM clear, nares clear, oral exam WNL Neck: supple, no lymphadenopathy, no thyromegaly, no JVD Pulm: clear to auscultation and percussion, respiratory effort normal CV: regular rate and rhythm, S1-S2, no murmur, rub or gallop, no bruits Chest: no scars, masses, no lumps BREAST: breast exam declined GI: soft, non-tender; no hepatosplenomegaly, masses; active bowel sounds all quadrants GU: GU exam declined Lymph: no cervical, axillary or inguinal adenopathy MSK: gait normal, muscle tone and strength WNL, no joint swelling, effusions, discoloration, crepitus  SKIN: clear, good turgor, color WNL, no rashes, lesions, or ulcerations Neuro: normal mental status, normal strength, sensation, and motion Psych: alert; oriented to person, place and time, normally interactive and not anxious or depressed in appearance.   All labs reviewed with patient. Results for orders placed or performed in visit on 03/03/19  Cytology - PAP  Result Value Ref Range   High risk HPV Negative    Adequacy      Satisfactory for evaluation; transformation zone component PRESENT.   Diagnosis      - Negative for intraepithelial lesion or malignancy (NILM)   Comment Normal Reference Range HPV - Negative     Assessment and Plan:     ICD-10-CM   1. Healthcare maintenance  Z00.00    We talked about weight loss, and post-pregnancy weight-loss has been frustrating. Encouraged exercise.  Fatigue with a 7 mo and a 74 1/38 year old.  Health  Maintenance Exam: The patient's preventative maintenance and recommended screening tests for an annual wellness exam were reviewed in full today. Brought up to date unless services declined.  Counselled on the importance of diet, exercise, and its role in overall health and mortality. The patient's FH and SH was reviewed, including their home life, tobacco status, and drug and alcohol status.  Follow-up in 1 year for physical exam or additional follow-up below.  Follow-up: No follow-ups on file. Or follow-up in 1 year if not noted.  No future appointments.  No orders of the defined types were placed in this encounter.  There are no discontinued medications. No orders of the defined types were placed in this encounter.   Signed,  Maud Deed. Lin Hackmann, MD   Allergies as of 07/23/2019      Reactions   Pseudoeph-doxylamine-dm-apap Hives      Medication List       Accurate as of July 23, 2019  3:12 PM. If you have  any questions, ask your nurse or doctor.        cyclobenzaprine 10 MG tablet Commonly known as: FLEXERIL Take 1 tablet (10 mg total) by mouth at bedtime.   escitalopram 20 MG tablet Commonly known as: Lexapro Take 1 tablet (20 mg total) by mouth daily.   ibuprofen 600 MG tablet Commonly known as: ADVIL Take 1 tablet (600 mg total) by mouth every 6 (six) hours as needed.   labetalol 100 MG tablet Commonly known as: NORMODYNE Take 1 tablet (100 mg total) by mouth 2 (two) times daily.   meloxicam 15 MG tablet Commonly known as: MOBIC Take 1 tablet (15 mg total) by mouth daily.   omeprazole 20 MG capsule Commonly known as: PRILOSEC Take 1 capsule (20 mg total) by mouth daily as needed.   valACYclovir 500 MG tablet Commonly known as: VALTREX Take 1 tablet (500 mg total) by mouth 2 (two) times daily as needed.

## 2019-07-23 NOTE — Patient Instructions (Addendum)
Intermittent fasting - works well with keto diet.

## 2019-07-24 LAB — LIPID PANEL
Cholesterol: 211 mg/dL — ABNORMAL HIGH (ref 0–200)
HDL: 63.1 mg/dL (ref >39.00)
LDL Cholesterol: 132 mg/dL — ABNORMAL HIGH (ref 0–99)
NonHDL: 148.38
Total CHOL/HDL Ratio: 3
Triglycerides: 82 mg/dL (ref 0.0–149.0)
VLDL: 16.4 mg/dL (ref 0.0–40.0)

## 2019-07-24 LAB — BASIC METABOLIC PANEL
BUN: 20 mg/dL (ref 6–23)
CO2: 25 mEq/L (ref 19–32)
Calcium: 9.3 mg/dL (ref 8.4–10.5)
Chloride: 103 mEq/L (ref 96–112)
Creatinine, Ser: 0.78 mg/dL (ref 0.40–1.20)
GFR: 82.46 mL/min (ref >60.00)
Glucose, Bld: 87 mg/dL (ref 70–99)
Potassium: 3.8 mEq/L (ref 3.5–5.1)
Sodium: 136 mEq/L (ref 135–145)

## 2019-07-24 LAB — HEPATIC FUNCTION PANEL
ALT: 11 U/L (ref 0–35)
AST: 14 U/L (ref 0–37)
Albumin: 4.3 g/dL (ref 3.5–5.2)
Alkaline Phosphatase: 36 U/L — ABNORMAL LOW (ref 39–117)
Bilirubin, Direct: 0.1 mg/dL (ref 0.0–0.3)
Total Bilirubin: 0.3 mg/dL (ref 0.2–1.2)
Total Protein: 6.9 g/dL (ref 6.0–8.3)

## 2019-07-24 LAB — CBC WITH DIFFERENTIAL/PLATELET
Basophils Absolute: 0.1 10*3/uL (ref 0.0–0.1)
Basophils Relative: 1.5 % (ref 0.0–3.0)
Eosinophils Absolute: 0.1 10*3/uL (ref 0.0–0.7)
Eosinophils Relative: 1.8 % (ref 0.0–5.0)
HCT: 44.6 % (ref 36.0–46.0)
Hemoglobin: 14.8 g/dL (ref 12.0–15.0)
Lymphocytes Relative: 24.4 % (ref 12.0–46.0)
Lymphs Abs: 1.8 10*3/uL (ref 0.7–4.0)
MCHC: 33.2 g/dL (ref 30.0–36.0)
MCV: 88.3 fl (ref 78.0–100.0)
Monocytes Absolute: 0.5 10*3/uL (ref 0.1–1.0)
Monocytes Relative: 6.8 % (ref 3.0–12.0)
Neutro Abs: 4.9 10*3/uL (ref 1.4–7.7)
Neutrophils Relative %: 65.5 % (ref 43.0–77.0)
Platelets: 254 10*3/uL (ref 150.0–400.0)
RBC: 5.05 Mil/uL (ref 3.87–5.11)
RDW: 13.3 % (ref 11.5–15.5)
WBC: 7.5 10*3/uL (ref 4.0–10.5)

## 2019-07-24 LAB — TSH: TSH: 1.94 u[IU]/mL (ref 0.35–4.50)

## 2019-07-24 LAB — HEMOGLOBIN A1C: Hgb A1c MFr Bld: 5.5 % (ref 4.6–6.5)

## 2019-07-24 LAB — HEPATITIS C ANTIBODY
Hepatitis C Ab: NONREACTIVE
SIGNAL TO CUT-OFF: 0.01 (ref <1.00)

## 2019-09-16 ENCOUNTER — Other Ambulatory Visit: Payer: Self-pay

## 2019-09-16 ENCOUNTER — Telehealth: Payer: Self-pay

## 2019-09-16 ENCOUNTER — Ambulatory Visit
Admission: EM | Admit: 2019-09-16 | Discharge: 2019-09-16 | Disposition: A | Discharge status: Home / Self Care | Payer: No Typology Code available for payment source | Attending: Family Medicine | Admitting: Family Medicine

## 2019-09-16 DIAGNOSIS — M79662 Pain in left lower leg: Secondary | ICD-10-CM

## 2019-09-16 DIAGNOSIS — T148XXA Other injury of unspecified body region, initial encounter: Secondary | ICD-10-CM

## 2019-09-16 MED ORDER — CYCLOBENZAPRINE HCL 10 MG PO TABS: 10.0000 mg | ORAL_TABLET | Freq: Two times a day (BID) | ORAL | 0 refills | Status: DC | PRN

## 2019-09-16 MED ORDER — MELOXICAM 15 MG PO TABS: 15.0000 mg | ORAL_TABLET | Freq: Every day | ORAL | 0 refills | Status: DC

## 2019-09-16 NOTE — ED Triage Notes (Addendum)
Pt is here with left calf muscle strain that happened Saturday, pt has taken Advil to relieve discomfort.

## 2019-09-16 NOTE — Telephone Encounter (Signed)
[facility] Day - Client TELEPHONE ADVICE RECORD [facility] Patient Name: Lacey Rodgers Gender: Female DOB: 1980/01/22 Age: 39 Y 7 M 4 D Return Phone Number: [phone] (Primary), [phone] (Secondary) Address: City/State/Zip: [name] [address] Client [facility] Day - Client Client Site [facility] - Day Physician Copland, Karleen Hampshire - MD Contact Type Call Who Is Calling Patient / Member / Family / Caregiver Call Type Triage / Clinical Relationship To Patient Self Return Phone Number [phone] (Primary) Chief Complaint Leg Pain Reason for Call Symptomatic / Request for Health Information Initial Comment Caller states that her left calf is hurting and there are sharp pains going through it. The pain started by feeling jiggly but then turned into the pain. It started when she was putting her child to bed. It does feel hard on the bottom of her leg. GOTO Facility Not Listed [employer], UC Translation No Nurse Assessment Nurse: Elesa Hacker, RN, Nash Dimmer Date/Time (Eastern Time): 09/16/2019 3:49:26 PM Confirm and document reason for call. If symptomatic, describe symptoms. ---Caller states that her left calf is hurting and there are sharp pains going through it. The pain started by feeling jiggly but then turned into the pain. It started when she was putting her child to bed. It does feel hard on the bottom of her leg. States that it has been going on for a couple of days. Advised that no known injury. Has the patient had close contact with a person known or suspected to have the novel coronavirus illness OR traveled / lives in area with major community spread (including international travel) in the last 14 days from the onset of symptoms? * If Asymptomatic, screen for exposure and travel within the last 14 days. ---No Does the patient have any new or worsening symptoms? ---Yes Will a triage be completed?  ---Yes Related visit to physician within the last 2 weeks? ---No Does the PT have any chronic conditions? (i.e. diabetes, asthma, this includes High risk factors for pregnancy, etc.) ---Yes List chronic conditions. ---HTN Is the patient pregnant or possibly pregnant? (Ask all females between the ages of 58-55) ---No Is this a behavioral health or substance abuse call? ---NoPLEASE NOTE: All timestamps contained within this report are represented as [place] Standard Time. CONFIDENTIALTY NOTICE: This fax transmission is intended only for the addressee. It contains information that is legally privileged, confidential or otherwise protected from use or disclosure. If you are not the intended recipient, you are strictly prohibited from reviewing, disclosing, copying using or disseminating any of this information or taking any action in reliance on or regarding this information. If you have received this fax in error, please notify us immediately by telephone so that we can arrange for its return to [place]. Phone: [phone], Toll-Free: [phone], Fax: [phone] Page: 2 of 2 Call Id: [id number] Guidelines Guideline Title Affirmed Question Affirmed Notes Nurse Date/Time [name] Time) Leg Pain [1] Thigh or calf pain AND [2] only 1 side AND [3] present > 1 hour (Exception: chronic unchanged pain) Deaton, RN, Nash Dimmer 09/16/2019 3:50:40 PM Disp. Time [name] Time) Disposition Final User 09/16/2019 3:54:41 PM See HCP within 4 Hours (or PCP triage) Yes Deaton, RN, Cory Roughen Disagree/Comply Comply Caller Understands Yes PreDisposition Did not know what to do Care Advice Given Per Guideline SEE HCP (OR PCP TRIAGE) WITHIN 4 HOURS: * IF OFFICE WILL BE OPEN: You need to be seen within the next 3 or 4 hours. Call your doctor (or NP/PA) now  or as soon as the office opens. CALL EMS IF: * You develop any chest pain or shortness of breath. CARE ADVICE given per Leg Pain (Adult) guideline. * UCC: Some  UCCs can manage patients who are stable and have less serious symptoms (e.g., minor illnesses and injuries). The triager must know the [facility] capabilities before sending a patient there. If unsure, call ahead. Comments User: Lacey Scot, RN Date/Time [name] Time): 09/16/2019 3:56:03 PM Called backline, per directive x 2. No answer. Called office number for appt. User: Lacey Scot, RN Date/Time [name] Time): 09/16/2019 3:59:08 PM Office advised that no appts available. Caller going to UC. User: Lacey Scot, RN Date/Time [name] Time): 09/16/2019 3:59:20 PM Spoke with Lacey Rodgers. Referrals GO TO FACILITY OTHER - SPECIFY

## 2019-09-16 NOTE — Telephone Encounter (Signed)
Pt is currently in the UC

## 2019-09-16 NOTE — ED Provider Notes (Signed)
[facility]   [id number] 09/16/19 Arrival Time: 1658  ST:[id number] PAIN  SUBJECTIVE: History from: patient. Mckenzey Parcell is a 39 y.o. female complains of left calf pain that began about 3 days ago.  Reports that she was at work and noticed that the area was hurting when she went down some stairs at work.  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  Has been taking Advil with minimal relief.  Describes the pain as a cramping.  Denies redness, swelling, heat. Symptoms are made worse with activity.  Denies similar symptoms in the past.  Denies fever, chills, erythema, ecchymosis, effusion, weakness, numbness and tingling, saddle paresthesias, loss of bowel or bladder function.      ROS: As per HPI.  All other pertinent ROS negative.     Past Medical History:  Diagnosis Date  . Depression   . Genital herpes   . GERD (gastroesophageal reflux disease)   . Hypertension   . Migraine   . Obesity affecting pregnancy    Past Surgical History:  Procedure Laterality Date  . CESAREAN SECTION N/A 12/16/2015   Procedure: CESAREAN SECTION;  Surgeon: Conard Novak, MD;  Location: [facility] ORS;  Service: Obstetrics;  Laterality: N/A;  . CESAREAN SECTION WITH BILATERAL TUBAL LIGATION  12/02/2018   Procedure: CESAREAN SECTION WITH BILATERAL TUBAL LIGATION;  Surgeon: Natale Milch, MD;  Location: [facility] ORS;  Service: Obstetrics;;  TOB 1850 WEIGHT 7lb 5oz LENGTH 19.75 APGAR 9/9  . DILATION AND CURETTAGE OF UTERUS  01/2013   Allergies  Allergen Reactions  . Pseudoeph-Doxylamine-Dm-Apap Hives   No current facility-administered medications on file prior to encounter.   Current Outpatient Medications on File Prior to Encounter  Medication Sig Dispense Refill  . escitalopram (LEXAPRO) 20 MG tablet Take 1 tablet (20 mg total) by mouth daily. 30 tablet 6  . ibuprofen (ADVIL) 600 MG tablet Take 1 tablet (600 mg total) by mouth every 6 (six) hours as needed. 60 tablet 3    . labetalol (NORMODYNE) 100 MG tablet Take 1 tablet (100 mg total) by mouth 2 (two) times daily. 60 tablet 0  . omeprazole (PRILOSEC) 20 MG capsule Take 1 capsule (20 mg total) by mouth daily as needed.    . valACYclovir (VALTREX) 500 MG tablet Take 1 tablet (500 mg total) by mouth 2 (two) times daily as needed. 30 tablet 5   Social History   Socioeconomic History  . Marital status: Married    Spouse name: Not on file  . Number of children: 1  . Years of education: Not on file  . Highest education level: Not on file  Occupational History  . Not on file  Tobacco Use  . Smoking status: Never Smoker  . Smokeless tobacco: Never Used  Vaping Use  . Vaping Use: Never used  Substance and Sexual Activity  . Alcohol use: Yes    Alcohol/week: 0.0 standard drinks    Comment: occ  . Drug use: No  . Sexual activity: Yes    Birth control/protection: Surgical    Comment: Tubal   Other Topics Concern  . Not on file  Social History Narrative  . Not on file   Social Determinants of Health   Financial Resource Strain: Low Risk   . Difficulty of Paying Living Expenses: Not hard at all  Food Insecurity: No Food Insecurity  . Worried About Programme researcher, broadcasting/film/video in the Last Year: Never true  . Ran Out of Food in  the Last Year: Never true  Transportation Needs: No Transportation Needs  . Lack of Transportation (Medical): No  . Lack of Transportation (Non-Medical): No  Physical Activity: Sufficiently Active  . Days of Exercise per Week: 5 days  . Minutes of Exercise per Session: 30 min  Stress: Stress Concern Present  . Feeling of Stress : To some extent  Social Connections: Unknown  . Frequency of Communication with Friends and Family: More than three times a week  . Frequency of Social Gatherings with Friends and Family: More than three times a week  . Attends Religious Services: Patient refused  . Active Member of Clubs or Organizations: Patient refused  . Attends Banker  Meetings: Patient refused  . Marital Status: Married  Catering manager Violence:   . Fear of Current or Ex-Partner: Not on file  . Emotionally Abused: Not on file  . Physically Abused: Not on file  . Sexually Abused: Not on file   Family History  Problem Relation Age of Onset  . Hypertension Mother   . Hypertension Father   . Hypertension Brother   . Cancer Brother 58  . Hypertension Maternal Grandmother   . Hypertension Maternal Grandfather   . Hypertension Paternal Grandmother   . Hypertension Paternal Grandfather     OBJECTIVE:  Vitals:   09/16/19 1744  BP: 125/83  Pulse: 83  Resp: 19  Temp: 99.8 F (37.7 C)  TempSrc: Oral  SpO2: 98%    General appearance: ALERT; in no acute distress.  Head: NCAT Lungs: Normal respiratory effort CV:  pulses 2+ bilaterally. Cap refill < 2 seconds Musculoskeletal:  Inspection: Skin warm, dry, clear and intact without obvious erythema, effusion, or ecchymosis.  Palpation: Nontender to palpation ROM: FROM active and passive Skin: warm and dry Neurologic: Ambulates without difficulty; Sensation intact about the upper/ lower extremities Psychological: alert and cooperative; normal mood and affect  DIAGNOSTIC STUDIES:  No results found.   ASSESSMENT & PLAN:  1. Pain of left calf   2. Muscle strain       Meds ordered this encounter  Medications  . cyclobenzaprine (FLEXERIL) 10 MG tablet    Sig: Take 1 tablet (10 mg total) by mouth 2 (two) times daily as needed for muscle spasms.    Dispense:  20 tablet    Refill:  0    Order Specific Question:   Supervising Provider    Answer:   Merrilee Jansky [id number]  . meloxicam (MOBIC) 15 MG tablet    Sig: Take 1 tablet (15 mg total) by mouth daily.    Dispense:  30 tablet    Refill:  0    Order Specific Question:   Supervising Provider    Answer:   Merrilee Jansky [id number]    Wear compression socks while at work Wear the Ace wrap to support the calf muscle while at  work Continue conservative management of rest, ice, and gentle stretches Take meloxicam as needed for pain relief.  Do not take with ibuprofen or Aleve.  (may cause abdominal discomfort, ulcers, and GI bleeds avoid taking with other NSAIDs) Take cyclobenzaprine at nighttime for symptomatic relief. Avoid driving or operating heavy machinery while using medication. Follow up with PCP if symptoms persist Return or go to the ER if you have any new or worsening symptoms (fever, chills, chest pain, abdominal pain, changes in bowel or bladder habits, pain radiating into lower legs)   Reviewed expectations re: course of current medical issues. Questions answered.  Outlined signs and symptoms indicating need for more acute intervention. Patient verbalized understanding. After Visit Summary given.       Moshe Cipro, NP 09/16/19 1815

## 2019-09-16 NOTE — Discharge Instructions (Addendum)
I have sent in meloxicam for you to take as an anti-inflammatory.  Do not take ibuprofen or Aleve with this medication.  You may take Tylenol with this.  I have also sent in Flexeril for you to take twice a day as needed for muscle spasms  We have wrapped her leg with an Ace wrap, I would wear this at work while you are walking around.  I would also recommend that you wear compression socks while you are at work  Follow-up with this office or primary care as needed  Follow-up with the ER for redness, swelling, heat to the area, trouble swallowing, trouble breathing, other concerning symptoms

## 2019-09-17 ENCOUNTER — Other Ambulatory Visit: Payer: Self-pay | Admitting: Family Medicine

## 2020-03-01 ENCOUNTER — Other Ambulatory Visit: Payer: Self-pay | Admitting: Obstetrics and Gynecology

## 2020-03-01 ENCOUNTER — Ambulatory Visit (INDEPENDENT_AMBULATORY_CARE_PROVIDER_SITE_OTHER): Payer: No Typology Code available for payment source | Admitting: Family Medicine

## 2020-03-01 ENCOUNTER — Encounter: Payer: Self-pay | Admitting: Family Medicine

## 2020-03-01 ENCOUNTER — Ambulatory Visit (INDEPENDENT_AMBULATORY_CARE_PROVIDER_SITE_OTHER)
Admission: RE | Admit: 2020-03-01 | Discharge: 2020-03-01 | Disposition: A | Discharge status: Home / Self Care | Payer: No Typology Code available for payment source | Source: Ambulatory Visit | Attending: Family Medicine | Admitting: Family Medicine

## 2020-03-01 ENCOUNTER — Other Ambulatory Visit: Payer: Self-pay | Admitting: Family Medicine

## 2020-03-01 ENCOUNTER — Other Ambulatory Visit: Payer: Self-pay

## 2020-03-01 VITALS — BP 114/60 | HR 86 | Temp 97.5°F | Ht 59.5 in | Wt 234.0 lb

## 2020-03-01 DIAGNOSIS — M542 Cervicalgia: Secondary | ICD-10-CM

## 2020-03-01 DIAGNOSIS — M4186 Other forms of scoliosis, lumbar region: Secondary | ICD-10-CM | POA: Diagnosis not present

## 2020-03-01 DIAGNOSIS — G5621 Lesion of ulnar nerve, right upper limb: Secondary | ICD-10-CM | POA: Diagnosis not present

## 2020-03-01 DIAGNOSIS — M545 Low back pain, unspecified: Secondary | ICD-10-CM

## 2020-03-01 DIAGNOSIS — F419 Anxiety disorder, unspecified: Secondary | ICD-10-CM

## 2020-03-01 DIAGNOSIS — F32A Depression, unspecified: Secondary | ICD-10-CM

## 2020-03-01 DIAGNOSIS — G5603 Carpal tunnel syndrome, bilateral upper limbs: Secondary | ICD-10-CM

## 2020-03-01 MED ORDER — PREDNISONE 20 MG PO TABS: ORAL_TABLET | ORAL | 0 refills | Status: DC

## 2020-03-01 NOTE — Progress Notes (Signed)
Tay Whitwell T. Bennye Nix, MD, CAQ Sports Medicine  Primary Care and Sports Medicine [facility] at [facility] [address] [place], [address]  Phone: [phone]  FAX: [phone]  Lacey Rodgers - 40 y.o. female  MRN [id number]  Date of Birth: [birth year]  Date: 03/01/2020  PCP: [name], MD  Referral: [name], MD  Chief Complaint  Patient presents with  . Back Pain  . Numbness    In finger-bilateral but right is worse  . Arm Pain    Lower Right arm-radiates to elbow    This visit occurred during the SARS-CoV-2 public health emergency.  Safety protocols were in place, including screening questions prior to the visit, additional usage of staff PPE, and extensive cleaning of exam room while observing appropriate contact time as indicated for disinfecting solutions.   Subjective:   Ibtisam Benge is a 40 y.o. very pleasant female patient with Body mass index is 46.47 kg/m. who presents with the following:  She is here with multiple MSK complaints with some associated neurological symptoms.  R arm pain and numbness:  She does have pain and tingling in an ulnar distribution on the right UE, and this is from the elbow to the hand.  This does get quite a bit worse when she is driving and with sleep.  Shaking her hand and arm does provide some relief.  She has not tried to do anything to help with this at home.  She also has some tingling in a medial distribution on the R >> L with use and compression of the wrist.  Has not tried splints or other intervention.  Back pain:  She has also had a history of intermittent chronic back pain and neck pain with a very significant history of scoliosis.  2020 lumbar spine films do note:  16 degree levoconvex scoliosis with mild rotatory component.  Back pain has progressed over time, and now it is hurting more without radicular symptoms or significant LE numbness.  CTS on the R Ulnar Neuro on the  R Cervical on the right - localized Chronic LBP on the right  Back x-ray today She has never done any formal physical therapy.  Wt Readings from Last 3 Encounters:  03/01/20 234 lb (106.1 kg)  07/23/19 223 lb 8 oz (101.4 kg)  07/06/19 227 lb (103 kg)    Review of Systems is noted in the HPI, as appropriate   Objective:   BP 114/60   Pulse 86   Temp (!) 97.5 F (36.4 C) (Temporal)   Ht 4' 11.5" (1.511 m)   Wt 234 lb (106.1 kg)   LMP 02/17/2020   SpO2 99%   BMI 46.47 kg/m    GEN: alert,appropriate PSYCH: Normally interactive. Cooperative during the interview.   CERVICAL SPINE EXAM Range of motion: Flexion, extension, lateral bending, and rotation: Mild loss of range of motion in all directions Pain with terminal motion: Yes, more with flexion and with lateral movements. Spinous Processes: NT SCM: NT Upper paracervical muscles: Notable caudally Upper traps: NT C5-T1 intact, sensation and motor   The entirety of the hand is nontender to examination. Tinel's and Phalen's tests are both positive on the right and induce tingling and numbness.  Phalen's at the elbow also induces some neuropathic pain as well as some numbness in the ulnar distribution.  Grip is intact.  With regards to her lower back, her range of motion is grossly full, but she does have pain with terminal movements.  She also does have pain in the erector spinae complex diffusely from approximately L3-S1.  She also has some pain in the posterior pelvis.  Radiology: DG Lumbar Spine Complete  Result Date: 03/02/2020 CLINICAL DATA:  Chronic back pain EXAM: LUMBAR SPINE - COMPLETE 4+ VIEW COMPARISON:  03/14/2018 FINDINGS: Five lumbar type vertebral segments. Vertebral body heights are maintained without evidence of fracture. Similar levocurvature of the lumbar spine with apex at the L2-3 level. Mild L2-3 disc height loss. No significant facet arthropathy is evident. SI joints unremarkable. IMPRESSION: Mild  levocurvature of the lumbar spine, similar to prior. Electronically Signed   By: Duanne Guess D.O.   On: 03/02/2020 14:07    Assessment and Plan:     ICD-10-CM   1. Low back pain of over 3 months duration  M54.50 DG Lumbar Spine Complete    Ambulatory referral to Physical Therapy  2. Other forms of scoliosis, lumbar region  M41.86 DG Lumbar Spine Complete    Ambulatory referral to Physical Therapy  3. Cubital tunnel syndrome on right  G56.21   4. Carpal tunnel syndrome, bilateral  G56.03   5. Cervicalgia  M54.2 Ambulatory referral to Physical Therapy   Neck pain combined with some radicular symptoms and numbness and tingling on the right side.  Question if combination of cubital tunnel versus carpal tunnel, this seems to be the distribution, but secondary to neck pathology cannot fully be excluded.  Chronic back pain in the setting of significant scoliosis with chronic symptoms and pain.  And going to have the patient do some formal physical therapy for her back and neck.  Also pulse with steroids.  Challenging for persistent symptoms.  Additional work-up including EMG and nerve conduction studies of the upper extremity may be reasonable and appropriate if not improving.  Additional interventions in the lumbar spine also may be indicated if the patient so wishes.  Meds ordered this encounter  Medications  . predniSONE (DELTASONE) 20 MG tablet    Sig: 2 tabs po for 7 days, then 1 tab po for 7 days    Dispense:  21 tablet    Refill:  0   Medications Discontinued During This Encounter  Medication Reason  . ibuprofen (ADVIL) 600 MG tablet Completed Course   Orders Placed This Encounter  Procedures  . DG Lumbar Spine Complete  . Ambulatory referral to Physical Therapy    Follow-up: Return in about 6 weeks (around 04/12/2020).  Signed,  Elpidio Galea. Dujuan Stankowski, MD   Outpatient Encounter Medications as of 03/01/2020  Medication Sig  . escitalopram (LEXAPRO) 20 MG tablet TAKE 1  TABLET BY MOUTH DAILY.  Marland Kitchen labetalol (NORMODYNE) 100 MG tablet TAKE 1 TABLET (100 MG TOTAL) BY MOUTH 2 (TWO) TIMES DAILY.  Marland Kitchen omeprazole (PRILOSEC) 20 MG capsule Take 1 capsule (20 mg total) by mouth daily as needed.  . predniSONE (DELTASONE) 20 MG tablet 2 tabs po for 7 days, then 1 tab po for 7 days  . valACYclovir (VALTREX) 500 MG tablet Take 1 tablet (500 mg total) by mouth 2 (two) times daily as needed.  . cyclobenzaprine (FLEXERIL) 10 MG tablet Take 1 tablet (10 mg total) by mouth 2 (two) times daily as needed for muscle spasms. (Patient not taking: Reported on 03/01/2020)  . meloxicam (MOBIC) 15 MG tablet Take 1 tablet (15 mg total) by mouth daily. (Patient not taking: Reported on 03/01/2020)  . [DISCONTINUED] ibuprofen (ADVIL) 600 MG tablet Take 1 tablet (600 mg total) by mouth every 6 (six)  hours as needed.   No facility-administered encounter medications on file as of 03/01/2020.

## 2020-03-01 NOTE — Patient Instructions (Addendum)
Wrestling pad with driving on the R elbow  Carpal tunnel wrist splints at night

## 2020-03-15 ENCOUNTER — Other Ambulatory Visit: Payer: Self-pay

## 2020-03-15 ENCOUNTER — Ambulatory Visit: Payer: No Typology Code available for payment source | Attending: Family Medicine

## 2020-03-15 DIAGNOSIS — M546 Pain in thoracic spine: Secondary | ICD-10-CM | POA: Insufficient documentation

## 2020-03-15 DIAGNOSIS — G8929 Other chronic pain: Secondary | ICD-10-CM | POA: Diagnosis present

## 2020-03-15 DIAGNOSIS — M545 Low back pain, unspecified: Secondary | ICD-10-CM | POA: Insufficient documentation

## 2020-03-16 NOTE — Therapy (Signed)
[facility] [address], [place], [address] Phone: [phone]   Fax:  [phone]  Physical Therapy Evaluation  Patient Details  Name: Lacey Rodgers MRN: [id number] Date of Birth: 1980-07-03 Referring Provider (PT): Copland MD   Encounter Date: 03/15/2020   PT End of Session - 03/16/20 1515    Visit Number 1    Number of Visits 17    Date for PT Re-Evaluation 06/07/20    PT Start Time 1600    PT Stop Time 1645    PT Time Calculation (min) 45 min    Activity Tolerance Patient tolerated treatment well    Behavior During Therapy [facility] for tasks assessed/performed           Past Medical History:  Diagnosis Date  . Depression   . Genital herpes   . GERD (gastroesophageal reflux disease)   . Hypertension   . Migraine   . Obesity affecting pregnancy     Past Surgical History:  Procedure Laterality Date  . CESAREAN SECTION N/A 12/16/2015   Procedure: CESAREAN SECTION;  Surgeon: Conard Novak, MD;  Location: [facility] ORS;  Service: Obstetrics;  Laterality: N/A;  . CESAREAN SECTION WITH BILATERAL TUBAL LIGATION  12/02/2018   Procedure: CESAREAN SECTION WITH BILATERAL TUBAL LIGATION;  Surgeon: Natale Milch, MD;  Location: [facility] ORS;  Service: Obstetrics;;  TOB 1850 WEIGHT 7lb 5oz LENGTH 19.75 APGAR 9/9  . DILATION AND CURETTAGE OF UTERUS  01/2013    There were no vitals filed for this visit.    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 Patient states she does some exercises to help with pain such as LTR, knees to chest, heating pad and bridges. Patient reports she has carpal tunnel. Goes to [place] with kids to play for fun. Pain states the pain has been worse since having the 2nd child.    Pertinent History PMH: C-Section: 2017, 2020; HTN;     Limitations Lifting;Standing    Patient Stated Goals pain relief    Currently in Pain? Yes    Pain Score 3    worst: 6/10; best 2-3/10   Pain Location Back    Pain Orientation Lower    Pain Descriptors / Indicators Aching    Pain Type Chronic pain              [facility] PT Assessment - 03/16/20 0001      Assessment   Medical Diagnosis low back pain    Referring Provider (PT) Copland MD    Onset Date/Surgical Date 01/03/96    Hand Dominance Right    Next MD Visit unknown    Prior Therapy no      Balance Screen   Has the patient fallen in the past 6 months No    Has the patient had a decrease in activity level because of a fear of falling?  Yes    Is the patient reluctant to leave their home because of a fear of falling?  No      Home [employer] information centre manager residence      Prior Function   Level of Independence Independent    Vocation Full time employment    Vocation Requirements standing, walking    Leisure playing with children      Cognition   Overall Cognitive  Status Within Functional Limits for tasks assessed      Observation/Other Assessments   Observations Increased R sided elevation on bent over test      Sensation   Light Touch Appears Intact      Functional Tests   Functional tests Squat      Sit to Stand   Comments Increased difficulty with squatting      Posture/Postural Control   Posture Comments FHP; forward rounded shouldera      ROM / Strength   AROM / PROM / Strength AROM;Strength      AROM   AROM Assessment Site Hip;Knee;Thoracic;Lumbar    Right/Left Hip Right;Left    Right Hip Extension 10    Right Hip Flexion 100    Right Hip External Rotation  30    Right Hip Internal Rotation  25    Right Hip ABduction 30    Right Hip ADduction 10    Left Hip Extension 10    Left Hip Flexion 100    Left Hip External Rotation  30    Left Hip Internal Rotation  25    Left Hip ABduction 30    Left Hip ADduction 10    Right/Left  Knee Right;Left    Right Knee Extension 5    Right Knee Flexion 130    Left Knee Extension 5    Left Knee Flexion 130    Lumbar Flexion WNL   return to standing   Lumbar Extension 50% - pain    Lumbar - Right Side Bend 50% limited - pain    Lumbar - Left Side Bend WNL    Lumbar - Right Rotation 50% limited -pain    Lumbar - Left Rotation WNL    Thoracic Flexion WNL    Thoracic Extension 75 % limited    Thoracic - Right Side Bend 50% limited - pain    Thoracic - Left Side Bend WNL    Thoracic - Right Rotation 50% limited - pain    Thoracic - Left Rotation WNL      Strength   Strength Assessment Site Hip;Lumbar;Thoracic    Right/Left Hip Left;Right    Right Hip Flexion 5/5    Right Hip Extension 4/5    Right Hip External Rotation  4/5    Right Hip Internal Rotation 4+/5    Right Hip ABduction 4/5    Right Hip ADduction 4/5    Left Hip Flexion 5/5    Left Hip Extension 4/5    Left Hip External Rotation 4/5    Left Hip Internal Rotation 4+/5    Left Hip ABduction 4/5    Left Hip ADduction 4/5    Lumbar Flexion 5/5    Lumbar Extension 5/5    Thoracic Flexion 5/5    Thoracic Extension 5/5      Palpation   Spinal mobility Hypomobility L1-5 centrally - increased pain; hypomobility Thoracic T7-12 on R    Palpation comment Increased TTP along lumbar extensors, hip abductors      Special Tests    Special Tests Lumbar    Lumbar Tests Slump Test      Slump test   Findings Positive    Side Left    Comment -light pain           Objective measurements completed on examination: See above findings.     TREATMENT Therapeutic Exercise Lumbar extension in standing -- 3 x 10 with towel Hip abduction in standing -- x 10 B  Hip extension in standing -- x 10 B  Performed exercises to address AROM concerns   PT Education - 03/16/20 1514    Education Details POC; form and exercise; HEP: lumbar extension,    Person(s) Educated Patient    Methods  Explanation;Demonstration;Handout    Comprehension Verbalized understanding;Returned demonstration            PT Short Term Goals - 03/16/20 1524      PT SHORT TERM GOAL #1   Title Patient will be independent HEP to continue benefits of therapy after DC    Baseline dependent with HEP    Time 6    Period Weeks    Status New             PT Long Term Goals - 03/16/20 1528      PT LONG TERM GOAL #1   Title Patient will have a significant improvement with FOTO to indicate decreased pain along the Lumbar spine    Time 6    Period Weeks    Status New      PT LONG TERM GOAL #2   Title Patient will have a worst pain score of 3/10 to indicate signficant improvement lumbar pain on the VAS.    Baseline 6/10    Time 6    Period Weeks    Status New      PT LONG TERM GOAL #3   Title Patient will be able to stand for >1 hour to indicate signficant improvement in lumbar and thoracic function.    Baseline 5 minutes    Time 6    Period Weeks    Status New                  Plan - 03/16/20 1516    Clinical Impression Statement Patient is a 40 yo right hand dominant female presenting with increased pain and spasm B along the R and L side along the lower thoracic spine and centrally along the LB. Patient demonstrates lumbar and thoracic dysfunction as indicated by increased pain with thoracic and lumbar movements, specifically L lateral flexion and rotation. Patient also demonstrates poor extension AROM along the lumbar and thoracic spine limiting patient's ability to stand for longer periods of time. Patient will benefit from further skilled therapy to return to prior level of function.           Patient will benefit from skilled therapeutic intervention in order to improve the following deficits and impairments:     Visit Diagnosis: Chronic midline low back pain without sciatica  Pain in thoracic spine     Problem List Patient Active Problem List   Diagnosis Date  Noted  . BMI 40.0-44.9, adult (HCC) 05/22/2018  . Moderate recurrent major depression (HCC) 02/08/2017  . Migraine aura, persistent 10/10/2012  . GENITAL HERPES 10/23/2008  . COMMON MIGRAINE 10/23/2008  . Essential hypertension 10/23/2008  . GERD 10/23/2008    Myrene Galas, PT DPT 03/16/2020, 3:53 PM  [facility] PHYS[facility] AND SPORTS MEDICINE [address], [place], [address] Phone: [phone]   Fax:  [phone]  Name: Lacey Rodgers MRN: [id number] Date of Birth: 1980-08-20

## 2020-03-24 ENCOUNTER — Ambulatory Visit: Payer: No Typology Code available for payment source

## 2020-03-24 ENCOUNTER — Other Ambulatory Visit: Payer: Self-pay

## 2020-03-24 DIAGNOSIS — G8929 Other chronic pain: Secondary | ICD-10-CM

## 2020-03-24 DIAGNOSIS — M545 Low back pain, unspecified: Secondary | ICD-10-CM

## 2020-03-24 DIAGNOSIS — M546 Pain in thoracic spine: Secondary | ICD-10-CM

## 2020-03-25 NOTE — Therapy (Signed)
[facility]IC[facility] [address] [place], [address] Phone: [phone]   Fax:  [phone]  Physical Therapy Treatment  Patient Details  Name: Lacey Rodgers MRN: [id number] Date of Birth: Feb 25, 1980 Referring Provider (PT): Copland MD   Encounter Date: 03/24/2020   PT End of Session - 03/25/20 0916    Visit Number 2    Number of Visits 17    Date for PT Re-Evaluation 06/07/20    PT Start Time 1515    PT Stop Time 1600    PT Time Calculation (min) 45 min    Activity Tolerance Patient tolerated treatment well    Behavior During Therapy [facility] for tasks assessed/performed           Past Medical History:  Diagnosis Date  . Depression   . Genital herpes   . GERD (gastroesophageal reflux disease)   . Hypertension   . Migraine   . Obesity affecting pregnancy     Past Surgical History:  Procedure Laterality Date  . CESAREAN SECTION N/A 12/16/2015   Procedure: CESAREAN SECTION;  Surgeon: Conard Novak, MD;  Location: [facility] ORS;  Service: Obstetrics;  Laterality: N/A;  . CESAREAN SECTION WITH BILATERAL TUBAL LIGATION  12/02/2018   Procedure: CESAREAN SECTION WITH BILATERAL TUBAL LIGATION;  Surgeon: Natale Milch, MD;  Location: [facility] ORS;  Service: Obstetrics;;  TOB 1850 WEIGHT 7lb 5oz LENGTH 19.75 APGAR 9/9  . DILATION AND CURETTAGE OF UTERUS  01/2013    There were no vitals filed for this visit.   Subjective Assessment - 03/24/20 1518    Subjective Patient states increased pain since the evaluation. Patient reports the pain has been around a 5-6 /10. Currently 4/10.    Pertinent History PMH: C-Section: 2017, 2020; HTN;    Limitations Lifting;Standing    Patient Stated Goals pain relief    Currently in Pain? Yes    Pain Score 4     Pain Location Back    Pain Orientation Lower    Pain Descriptors / Indicators Aching    Pain Type Chronic pain    Pain Onset More than a month ago    Pain  Frequency Intermittent                 TREATMENT Therapeutic Exercise Knees to chest with physioball - 2 x 20  Pelvic tilts in supine - 2 x 30  LTRs in hooklying - x 30  Sitting Pelvic tilts - x 30  Prayer stretch with physioball straight and with rotation - x 20  Sitting thoracic rotation against manual resistance - x 20   Performed to decrease pain and spasms   Manual therapy  STM performed to the multifidus and deep extensors of the lumbar spine with patient positioned in prone to decrease increased spasms and pain. Decreased pain after performance, however required light pressure to perform.  Mobilizations Grade II centrally to the lumbar spine - 2 x 30 L3-5 ; unilaterally to the L L3-5 2 x 30sec to decrease pain and spasms along her spine - minimal improvement noted       PT Education - 03/24/20 1548    Education Details form/technique with exercise    Person(s) Educated Patient    Methods Explanation;Demonstration    Comprehension Verbalized understanding;Returned demonstration            PT Short Term Goals - 03/16/20 1524      PT SHORT TERM GOAL #1  Title Patient will be independent HEP to continue benefits of therapy after DC    Baseline dependent with HEP    Time 6    Period Weeks    Status New             PT Long Term Goals - 03/16/20 1528      PT LONG TERM GOAL #1   Title Patient will have a significant improvement with FOTO to indicate decreased pain along the Lumbar spine    Time 6    Period Weeks    Status New      PT LONG TERM GOAL #2   Title Patient will have a worst pain score of 3/10 to indicate signficant improvement lumbar pain on the VAS.    Baseline 6/10    Time 6    Period Weeks    Status New      PT LONG TERM GOAL #3   Title Patient will be able to stand for >1 hour to indicate signficant improvement in lumbar and thoracic function.    Baseline 5 minutes    Time 6    Period Weeks    Status New                  Plan - 03/25/20 5784    Clinical Impression Statement Performed exercises to address lumbar stabilization, mobilizations, and analgesic effects. Patient performs these well overall, however continues to have limited AROM seconary to pain. This improves with exercises performed indicated in improvement in overall mobility. Patient will benefit from furhter skilled therapy to return to prior level of function.    Personal Factors and Comorbidities Comorbidity 1    Comorbidities Obesity    Examination-Activity Limitations Lift;Squat    Examination-Participation Restrictions Cleaning;Occupation    Stability/Clinical Decision Making Stable/Uncomplicated    Rehab Potential Good    PT Frequency 2x / week    PT Duration 6 weeks    PT Treatment/Interventions Passive range of motion;Manual techniques;Therapeutic exercise;Therapeutic activities;Neuromuscular re-education;Joint Manipulations;Spinal Manipulations;Patient/family education;Balance training;Moist Heat;Iontophoresis 4mg /ml Dexamethasone;Cryotherapy;Dry needling    PT Next Visit Plan strengthening    PT Home Exercise Plan see education section           Patient will benefit from skilled therapeutic intervention in order to improve the following deficits and impairments:  Decreased coordination,Pain,Increased muscle spasms,Decreased endurance,Decreased range of motion,Decreased strength,Hypomobility,Obesity  Visit Diagnosis: Chronic midline low back pain without sciatica  Pain in thoracic spine     Problem List Patient Active Problem List   Diagnosis Date Noted  . BMI 40.0-44.9, adult (HCC) 05/22/2018  . Moderate recurrent major depression (HCC) 02/08/2017  . Migraine aura, persistent 10/10/2012  . GENITAL HERPES 10/23/2008  . COMMON MIGRAINE 10/23/2008  . Essential hypertension 10/23/2008  . GERD 10/23/2008    10/25/2008, PT DPT 03/25/2020, 9:26 AM  [place] [facility] PHYSICAL  AND SPORTS MEDICINE [address], [address], [place] Phone: [phone]   Fax:  [phone]  Name: Lacey Rodgers MRN: Suezanne Cheshire Date of Birth: 1980-05-27

## 2020-03-31 ENCOUNTER — Ambulatory Visit: Payer: No Typology Code available for payment source

## 2020-03-31 ENCOUNTER — Other Ambulatory Visit: Payer: Self-pay

## 2020-03-31 DIAGNOSIS — M545 Low back pain, unspecified: Secondary | ICD-10-CM | POA: Diagnosis not present

## 2020-03-31 DIAGNOSIS — M546 Pain in thoracic spine: Secondary | ICD-10-CM

## 2020-03-31 NOTE — Therapy (Signed)
[facility] [address] [place], [address] Phone: [phone]   Fax:  [phone]  Physical Therapy Treatment  Patient Details  Name: Lacey Rodgers MRN: [id number] Date of Birth: 01-06-1980 Referring Provider (PT): Copland MD   Encounter Date: 03/31/2020   PT End of Session - 03/31/20 1533    Visit Number 3    Number of Visits 17    Date for PT Re-Evaluation 06/07/20    PT Start Time 1515    PT Stop Time 1600    PT Time Calculation (min) 45 min    Activity Tolerance Patient tolerated treatment well    Behavior During Therapy [facility] for tasks assessed/performed           Past Medical History:  Diagnosis Date  . Depression   . Genital herpes   . GERD (gastroesophageal reflux disease)   . Hypertension   . Migraine   . Obesity affecting pregnancy     Past Surgical History:  Procedure Laterality Date  . CESAREAN SECTION N/A 12/16/2015   Procedure: CESAREAN SECTION;  Surgeon: Conard Novak, MD;  Location: [facility] ORS;  Service: Obstetrics;  Laterality: N/A;  . CESAREAN SECTION WITH BILATERAL TUBAL LIGATION  12/02/2018   Procedure: CESAREAN SECTION WITH BILATERAL TUBAL LIGATION;  Surgeon: Natale Milch, MD;  Location: [facility] ORS;  Service: Obstetrics;;  TOB 1850 WEIGHT 7lb 5oz LENGTH 19.75 APGAR 9/9  . DILATION AND CURETTAGE OF UTERUS  01/2013    There were no vitals filed for this visit.   Subjective Assessment - 03/31/20 1520    Subjective Patient reports increased soreness Saturday, but states she has been feeling better the past two days.    Pertinent History PMH: C-Section: 2017, 2020; HTN;    Limitations Lifting;Standing    Patient Stated Goals pain relief    Currently in Pain? Yes    Pain Score 2     Pain Location Back    Pain Orientation Lower    Pain Descriptors / Indicators Aching    Pain Type Chronic pain    Pain Onset More than a month ago    Pain Frequency  Intermittent           TREATMENT Therapeutic Exercise DeadBug - 4 x 4 B (only LEs) Childs pose - x 20 Bird dog in quadruped - x 10 (UE and LE)  Pelvic tilts in supine - 2 x 20  LTRs in hooklying - x 20  Hip Hikes - x 10  Donkey Kicks with elbows on elevated table - x 10  Stir the pot - x 10    Performed to decrease pain and spasms     PT Education - 03/31/20 1528    Education Details form/technique with exercise    Person(s) Educated Patient    Methods Explanation;Demonstration    Comprehension Verbalized understanding;Returned demonstration            PT Short Term Goals - 03/16/20 1524      PT SHORT TERM GOAL #1   Title Patient will be independent HEP to continue benefits of therapy after DC    Baseline dependent with HEP    Time 6    Period Weeks    Status New             PT Long Term Goals - 03/16/20 1528      PT LONG TERM GOAL #1   Title Patient will have a significant improvement  with FOTO to indicate decreased pain along the Lumbar spine    Time 6    Period Weeks    Status New      PT LONG TERM GOAL #2   Title Patient will have a worst pain score of 3/10 to indicate signficant improvement lumbar pain on the VAS.    Baseline 6/10    Time 6    Period Weeks    Status New      PT LONG TERM GOAL #3   Title Patient will be able to stand for >1 hour to indicate signficant improvement in lumbar and thoracic function.    Baseline 5 minutes    Time 6    Period Weeks    Status New                 Plan - 03/31/20 1540    Clinical Impression Statement Performed exercise to address Lumbar pain and spasms with the use of movement. Patient continues to demonstrate poor core stabilization with noted abberant movements with performing exercises such as deadbugs. Patient is improving overall with less pain compared to the previous session. Patient will benefit form further skilled therapy to return to prior level of function.    Personal Factors and  Comorbidities Comorbidity 1    Comorbidities Obesity    Examination-Activity Limitations Lift;Squat    Examination-Participation Restrictions Cleaning;Occupation    Stability/Clinical Decision Making Stable/Uncomplicated    Rehab Potential Good    PT Frequency 2x / week    PT Duration 6 weeks    PT Treatment/Interventions Passive range of motion;Manual techniques;Therapeutic exercise;Therapeutic activities;Neuromuscular re-education;Joint Manipulations;Spinal Manipulations;Patient/family education;Balance training;Moist Heat;Iontophoresis 4mg /ml Dexamethasone;Cryotherapy;Dry needling    PT Next Visit Plan strengthening    PT Home Exercise Plan see education section           Patient will benefit from skilled therapeutic intervention in order to improve the following deficits and impairments:  Decreased coordination,Pain,Increased muscle spasms,Decreased endurance,Decreased range of motion,Decreased strength,Hypomobility,Obesity  Visit Diagnosis: Chronic midline low back pain without sciatica  Pain in thoracic spine     Problem List Patient Active Problem List   Diagnosis Date Noted  . BMI 40.0-44.9, adult (HCC) 05/22/2018  . Moderate recurrent major depression (HCC) 02/08/2017  . Migraine aura, persistent 10/10/2012  . GENITAL HERPES 10/23/2008  . COMMON MIGRAINE 10/23/2008  . Essential hypertension 10/23/2008  . GERD 10/23/2008    10/25/2008, PT DPT 03/31/2020, 4:07 PM  [facility] PHYSICAL AND SPORTS MEDICINE [address], [place] Phone: [phone]   Fax:  [phone]  Name: Lacey Rodgers MRN: Suezanne Cheshire Date of Birth: Mar 05, 1980

## 2020-04-04 ENCOUNTER — Ambulatory Visit
Admission: EM | Admit: 2020-04-04 | Discharge: 2020-04-04 | Disposition: A | Discharge status: Home / Self Care | Payer: No Typology Code available for payment source | Attending: Physician Assistant | Admitting: Physician Assistant

## 2020-04-04 ENCOUNTER — Encounter: Payer: Self-pay | Admitting: Emergency Medicine

## 2020-04-04 ENCOUNTER — Other Ambulatory Visit: Payer: Self-pay

## 2020-04-04 DIAGNOSIS — H6692 Otitis media, unspecified, left ear: Secondary | ICD-10-CM | POA: Diagnosis not present

## 2020-04-04 DIAGNOSIS — J0140 Acute pansinusitis, unspecified: Secondary | ICD-10-CM | POA: Diagnosis present

## 2020-04-04 DIAGNOSIS — I10 Essential (primary) hypertension: Secondary | ICD-10-CM

## 2020-04-04 DIAGNOSIS — Z20822 Contact with and (suspected) exposure to covid-19: Secondary | ICD-10-CM | POA: Insufficient documentation

## 2020-04-04 DIAGNOSIS — R059 Cough, unspecified: Secondary | ICD-10-CM

## 2020-04-04 MED ORDER — AMOXICILLIN-POT CLAVULANATE 875-125 MG PO TABS: 1.0000 | ORAL_TABLET | Freq: Two times a day (BID) | ORAL | 0 refills | Status: AC

## 2020-04-04 NOTE — Discharge Instructions (Addendum)
Recommend start Augmentin 875mg  twice a day as directed. May use OTC Afrin nasal spray as directed- use for only 2 to 3 days to help with congestion. Continue to push fluids to help loosen up mucus in sinuses and chest. Continue Delsym every 12 hours as needed for cough. Avoid OTC decongestants and other medications that can raise your blood pressure. Rest. Stay at home. May take Tylenol 1000mg  or Ibuprofen 600mg  every 8 hours as needed for pain. Follow-up pending COVID 19 test results and in 3 to 4 days if not improving.

## 2020-04-04 NOTE — ED Triage Notes (Signed)
Pt c/o cough, nasal congestion, dizziness and itchy/watery eyes. Started about 4 days ago.

## 2020-04-04 NOTE — ED Provider Notes (Signed)
[facility]    CSN: [id number] Arrival date & time: 04/04/20  1101      History   Chief Complaint Chief Complaint  Patient presents with  . Cough  . Nasal Congestion    HPI Lacey Rodgers is a 40 y.o. female.   40 year old female presents with nasal congestion, cough, headache, ear pressure, dizziness and scratchy throat for 4 to 5 days. Started with some itchy/watery eyes. Denies any fever or GI symptoms. Has tried OTC Mucinex, Theraflu, Robitussin and various other sinus and allergy medications with no relief. No known exposure to COVID 19. Has been vaccinated. Other chronic health issues include HTN, GERD, migraine headaches, and depression. Currently on Labetalol, Lexapro, Prilosec daily and Valtrex prn.   The history is provided by the patient.    Past Medical History:  Diagnosis Date  . Depression   . Genital herpes   . GERD (gastroesophageal reflux disease)   . Hypertension   . Migraine   . Obesity affecting pregnancy     Patient Active Problem List   Diagnosis Date Noted  . BMI 40.0-44.9, adult (HCC) 05/22/2018  . Moderate recurrent major depression (HCC) 02/08/2017  . Migraine aura, persistent 10/10/2012  . GENITAL HERPES 10/23/2008  . COMMON MIGRAINE 10/23/2008  . Essential hypertension 10/23/2008  . GERD 10/23/2008    Past Surgical History:  Procedure Laterality Date  . CESAREAN SECTION N/A 12/16/2015   Procedure: CESAREAN SECTION;  Surgeon: Conard Novak, MD;  Location: [facility] ORS;  Service: Obstetrics;  Laterality: N/A;  . CESAREAN SECTION WITH BILATERAL TUBAL LIGATION  12/02/2018   Procedure: CESAREAN SECTION WITH BILATERAL TUBAL LIGATION;  Surgeon: Natale Milch, MD;  Location: [facility];  Service: Obstetrics;;  TOB 1850 WEIGHT 7lb 5oz LENGTH 19.75 APGAR 9/9  . DILATION AND CURETTAGE OF UTERUS  01/2013    OB History    Gravida  4   Para  2   Term  2   Preterm  0   AB  2   Living  2     SAB      IAB       Ectopic      Multiple  0   Live Births  2            Home Medications    Prior to Admission medications   Medication Sig Start Date End Date Taking? Authorizing Provider  amoxicillin-clavulanate (AUGMENTIN) 875-125 MG tablet Take 1 tablet by mouth every 12 (twelve) hours for 7 days. 04/04/20 04/11/20 Yes Mennie Spiller, Ali Lowe, NP  escitalopram (LEXAPRO) 20 MG tablet TAKE 1 TABLET BY MOUTH DAILY. 03/01/20 03/01/21 Yes Schuman, Christanna R, MD  labetalol (NORMODYNE) 100 MG tablet TAKE 1 TABLET BY MOUTH 2 TIMES DAILY. 09/17/19 09/16/20 Yes Copland, Karleen Hampshire, MD  omeprazole (PRILOSEC) 20 MG capsule Take 1 capsule (20 mg total) by mouth daily as needed. 12/04/18  Yes Farrel Conners, CNM  valACYclovir (VALTREX) 500 MG tablet Take 1 tablet (500 mg total) by mouth 2 (two) times daily as needed. 12/04/18  Yes Farrel Conners, CNM  meloxicam (MOBIC) 15 MG tablet Take 1 tablet (15 mg total) by mouth daily. Patient not taking: Reported on 03/01/2020 09/16/19   Moshe Cipro, NP    Family History Family History  Problem Relation Age of Onset  . Hypertension Mother   . Hypertension Father   . Hypertension Brother   . Cancer Brother 68  . Hypertension Maternal Grandmother   . Hypertension Maternal Grandfather   .  Hypertension Paternal Grandmother   . Hypertension Paternal Grandfather     Social History Social History   Tobacco Use  . Smoking status: Never Smoker  . Smokeless tobacco: Never Used  Vaping Use  . Vaping Use: Never used  Substance Use Topics  . Alcohol use: Yes    Alcohol/week: 0.0 standard drinks    Comment: occ  . Drug use: No     Allergies   [id number]   Review of Systems Review of Systems  Constitutional: Positive for fatigue. Negative for activity change, appetite change, chills, diaphoresis and fever.  HENT: Positive for congestion, ear pain, postnasal drip, rhinorrhea, sinus pressure, sinus pain and sore throat (scratchy). Negative for  ear discharge, facial swelling, mouth sores, nosebleeds and trouble swallowing.   Eyes: Positive for discharge (watery) and itching. Negative for photophobia, pain, redness and visual disturbance.  Respiratory: Positive for cough. Negative for chest tightness, shortness of breath and wheezing.   Gastrointestinal: Negative for diarrhea, nausea and vomiting.  Musculoskeletal: Negative for arthralgias, myalgias, neck pain and neck stiffness.  Skin: Negative for color change and rash.  Allergic/Immunologic: Negative for environmental allergies, food allergies and immunocompromised state.  Neurological: Positive for dizziness, light-headedness and headaches. Negative for tremors, seizures, syncope, facial asymmetry, speech difficulty, weakness and numbness.  Hematological: Negative for adenopathy. Does not bruise/bleed easily.     Physical Exam Triage Vital Signs ED Triage Vitals  Enc Vitals Group     BP 04/04/20 1123 (!) 156/105     Pulse Rate 04/04/20 1123 77     Resp 04/04/20 1123 18     Temp 04/04/20 1123 98.5 F (36.9 C)     Temp Source 04/04/20 1123 Oral     SpO2 04/04/20 1123 98 %     Weight 04/04/20 1120 233 lb 14.5 oz (106.1 kg)     Height 04/04/20 1120 4' 11.5" (1.511 m)     Head Circumference --      Peak Flow --      Pain Score 04/04/20 1120 0     Pain Loc --      Pain Edu? --      Excl. in GC? --    No data found.  Updated Vital Signs BP (!) 156/105 (BP Location: Right Arm)   Pulse 77   Temp 98.5 F (36.9 C) (Oral)   Resp 18   Ht 4' 11.5" (1.511 m)   Wt 233 lb 14.5 oz (106.1 kg)   LMP 03/31/2020 (Approximate)   SpO2 98%   Breastfeeding No   BMI 46.45 kg/m   Visual Acuity Right Eye Distance:   Left Eye Distance:   Bilateral Distance:    Right Eye Near:   Left Eye Near:    Bilateral Near:     Physical Exam Vitals and nursing note reviewed.  Constitutional:      General: She is awake. She is not in acute distress.    Appearance: She is well-developed  and well-groomed. She is ill-appearing.     Comments: She is sitting comfortably on the exam table in no acute distress but appears tired and ill.   HENT:     Head: Normocephalic and atraumatic.     Right Ear: Hearing, ear canal and external ear normal. A middle ear effusion is present. Tympanic membrane is bulging. Tympanic membrane is not injected or erythematous.     Left Ear: Hearing, ear canal and external ear normal. A middle ear effusion is present. Tympanic membrane is injected, erythematous  and bulging.     Nose: Mucosal edema and congestion present.     Right Sinus: Maxillary sinus tenderness and frontal sinus tenderness present.     Left Sinus: Maxillary sinus tenderness and frontal sinus tenderness present.     Mouth/Throat:     Lips: Pink.     Mouth: Mucous membranes are moist.     Pharynx: Uvula midline. Oropharyngeal exudate and posterior oropharyngeal erythema present. No pharyngeal swelling or uvula swelling.     Comments: Slight yellowish post nasal drainage present.  Eyes:     General:        Right eye: No discharge.        Left eye: No discharge.     Extraocular Movements: Extraocular movements intact.     Conjunctiva/sclera: Conjunctivae normal.  Cardiovascular:     Rate and Rhythm: Normal rate and regular rhythm.     Heart sounds: Normal heart sounds. No murmur heard.   Pulmonary:     Effort: Pulmonary effort is normal. No respiratory distress.     Breath sounds: Normal air entry. No decreased air movement. Examination of the right-upper field reveals rhonchi. Examination of the left-upper field reveals rhonchi. Rhonchi (with coughing) present. No decreased breath sounds, wheezing or rales.  Musculoskeletal:        General: Normal range of motion.     Cervical back: Normal range of motion and neck supple.  Lymphadenopathy:     Cervical: No cervical adenopathy.  Skin:    General: Skin is warm and dry.     Capillary Refill: Capillary refill takes less than 2  seconds.     Findings: No rash.  Neurological:     General: No focal deficit present.     Mental Status: She is alert and oriented to person, place, and time.  Psychiatric:        Mood and Affect: Mood normal.        Behavior: Behavior normal. Behavior is cooperative.        Thought Content: Thought content normal.        Judgment: Judgment normal.      UC Treatments / Results  Labs (all labs ordered are listed, but only abnormal results are displayed) Labs Reviewed  SARS CORONAVIRUS 2 (TAT 6-24 HRS)    EKG   Radiology No results found.  Procedures Procedures (including critical care time)  Medications Ordered in UC Medications - No data to display  Initial Impression / Assessment and Plan / UC Course  I have reviewed the triage vital signs and the nursing notes.  Pertinent labs & imaging results that were available during my care of the patient were reviewed by me and considered in my medical decision making (see chart for details).    Reviewed with patient that she has a left ear infection with sinus infection- will treat for probable bacterial infection- start Augmentin  twice a day as directed. Discussed that blood pressure elevated probably due to OTC decongestants and current illness. Recommend trial OTC Afrin nasal spray as directed- use for only 2 to 3 days. Continue to push fluids to help loosen up mucus in sinuses and chest. May take OTC Delsym every 12 hours as needed for cough. Patient has allergy to "Nyquil" but since she can take Sudafed and Robitussin- her allergy may be the antihistamine. Avoid Sudafed and other OTC decongestants since they can raise blood pressure. Rest. Stay at home. May take Tylenol  or Ibuprofen  every 8 hours as needed  for pain. Note written for work. Follow-up pending COVID 19 test results and in 3 to 4 days if not improving.  Final Clinical Impressions(s) / UC Diagnoses   Final diagnoses:  Acute left otitis media   Acute non-recurrent pansinusitis  Cough  Elevated blood pressure reading in office with diagnosis of hypertension     Discharge Instructions     Recommend start Augmentin 875mg  twice a day as directed. May use OTC Afrin nasal spray as directed- use for only 2 to 3 days to help with congestion. Continue to push fluids to help loosen up mucus in sinuses and chest. Continue Delsym every 12 hours as needed for cough. Avoid OTC decongestants and other medications that can raise your blood pressure. Rest. Stay at home. May take Tylenol 1000mg  or Ibuprofen 600mg  every 8 hours as needed for pain. Follow-up pending COVID 19 test results and in 3 to 4 days if not improving.     ED Prescriptions    Medication Sig Dispense Auth. Provider   amoxicillin-clavulanate (AUGMENTIN) 875-125 MG tablet Take 1 tablet by mouth every 12 (twelve) hours for 7 days. 14 tablet Decie Verne, , NP     PDMP not reviewed this encounter.   , NP 04/05/20 1040

## 2020-04-05 ENCOUNTER — Ambulatory Visit: Payer: No Typology Code available for payment source | Attending: Family Medicine

## 2020-04-05 DIAGNOSIS — M546 Pain in thoracic spine: Secondary | ICD-10-CM | POA: Insufficient documentation

## 2020-04-05 DIAGNOSIS — G8929 Other chronic pain: Secondary | ICD-10-CM | POA: Insufficient documentation

## 2020-04-05 DIAGNOSIS — M545 Low back pain, unspecified: Secondary | ICD-10-CM | POA: Insufficient documentation

## 2020-04-05 LAB — SARS CORONAVIRUS 2 (TAT 6-24 HRS): SARS Coronavirus 2: NEGATIVE

## 2020-04-07 ENCOUNTER — Ambulatory Visit: Payer: No Typology Code available for payment source

## 2020-04-07 ENCOUNTER — Other Ambulatory Visit: Payer: Self-pay

## 2020-04-07 DIAGNOSIS — G8929 Other chronic pain: Secondary | ICD-10-CM

## 2020-04-07 DIAGNOSIS — M546 Pain in thoracic spine: Secondary | ICD-10-CM

## 2020-04-07 DIAGNOSIS — M545 Low back pain, unspecified: Secondary | ICD-10-CM | POA: Diagnosis present

## 2020-04-08 NOTE — Therapy (Signed)
[facility] PHYS[facility]ORTS MEDICINE [address], [place], [address] Phone: [phone]   Fax:  [phone]  Physical Therapy Treatment  Patient Details  Name: Lacey Rodgers MRN: [id number] Date of Birth: [birth year] Referring Provider (PT): Copland MD   Encounter Date: 04/07/2020   PT End of Session - 04/07/20 1615    Visit Number 4    Number of Visits 17    Date for PT Re-Evaluation 06/07/20    PT Start Time 1600    PT Stop Time 1645    PT Time Calculation (min) 45 min    Activity Tolerance Patient tolerated treatment well    Behavior During Therapy [facility] for tasks assessed/performed           Past Medical History:  Diagnosis Date  . Depression   . Genital herpes   . GERD (gastroesophageal reflux disease)   . Hypertension   . Migraine   . Obesity affecting pregnancy     Past Surgical History:  Procedure Laterality Date  . CESAREAN SECTION N/A 12/16/2015   Procedure: CESAREAN SECTION;  Surgeon: Conard Novak, MD;  Location: [facility] ORS;  Service: Obstetrics;  Laterality: N/A;  . CESAREAN SECTION WITH BILATERAL TUBAL LIGATION  12/02/2018   Procedure: CESAREAN SECTION WITH BILATERAL TUBAL LIGATION;  Surgeon: Natale Milch, MD;  Location: [facility] OR[facility];  Service: Obstetrics;;  TOB 1850 WEIGHT 7lb 5oz LENGTH 19.75 APGAR 9/9  . DILATION AND CURETTAGE OF UTERUS  01/2013    There were no vitals filed for this visit.   Subjective Assessment - 04/07/20 1613    Subjective Patient states her back has been feeling a lot better. Patient states her back has not been bothering her much.    Pertinent History PMH: C-Section: 2017, 2020; HTN;    Limitations Lifting;Standing    Patient Stated Goals pain relief    Currently in Pain? No/denies    Pain Onset More than a month ago              TREATMENT Therapeutic Exercise DeadBug - 6  x 4 B (only LEs) Pelvic tilts in supine - 2 x 20 LTRs in hooklying - x 20 LTRs in  deadbug  -- x20   Bird dog in quadruped - x 10 (UE and LE)  Bird dog in quadruped - x 5 (UE and LE) with circles holding 10 sec with extremities Prayer stretch in sitting - x 10  Hip Hikes - x 10  Donkey Kicks with elbows on elevated table - x 10     Performed to decrease pain and spasms      PT Education - 04/07/20 1614    Education Details form/technique with exercise    Person(s) Educated Patient    Methods Explanation;Demonstration    Comprehension Verbalized understanding;Returned demonstration            PT Short Term Goals - 03/16/20 1524      PT SHORT TERM GOAL #1   Title Patient will be independent HEP to continue benefits of therapy after DC    Baseline dependent with HEP    Time 6    Period Weeks    Status New             PT Long Term Goals - 03/16/20 1528      PT LONG TERM GOAL #1   Title Patient will have a significant improvement with FOTO to indicate decreased pain along the Lumbar spine  Time 6    Period Weeks    Status New      PT LONG TERM GOAL #2   Title Patient will have a worst pain score of 3/10 to indicate signficant improvement lumbar pain on the VAS.    Baseline 6/10    Time 6    Period Weeks    Status New      PT LONG TERM GOAL #3   Title Patient will be able to stand for >1 hour to indicate signficant improvement in lumbar and thoracic function.    Baseline 5 minutes    Time 6    Period Weeks    Status New                 Plan - 04/07/20 1629    Clinical Impression Statement Performed exercises to address core stabilization and lumbar stabilization. No increase in pain throuhgout the session, although patient continues to fatigue quickly with exercises performed. Will continue to progress core stabilization throughout future sessions and patient will benefit from furhter skilled therapy to return to prior level of function.    Personal Factors and Comorbidities Comorbidity 1    Comorbidities Obesity     Examination-Activity Limitations Lift;Squat    Examination-Participation Restrictions Cleaning;Occupation    Stability/Clinical Decision Making Stable/Uncomplicated    Rehab Potential Good    PT Frequency 2x / week    PT Duration 6 weeks    PT Treatment/Interventions Passive range of motion;Manual techniques;Therapeutic exercise;Therapeutic activities;Neuromuscular re-education;Joint Manipulations;Spinal Manipulations;Patient/family education;Balance training;Moist Heat;Iontophoresis 4mg /ml Dexamethasone;Cryotherapy;Dry needling    PT Next Visit Plan strengthening    PT Home Exercise Plan see education section           Patient will benefit from skilled therapeutic intervention in order to improve the following deficits and impairments:  Decreased coordination,Pain,Increased muscle spasms,Decreased endurance,Decreased range of motion,Decreased strength,Hypomobility,Obesity  Visit Diagnosis: Chronic midline low back pain without sciatica  Pain in thoracic spine     Problem List Patient Active Problem List   Diagnosis Date Noted  . BMI 40.0-44.9, adult (HCC) 05/22/2018  . Moderate recurrent major depression (HCC) 02/08/2017  . Migraine aura, persistent 10/10/2012  . GENITAL HERPES 10/23/2008  . COMMON MIGRAINE 10/23/2008  . Essential hypertension 10/23/2008  . GERD 10/23/2008    10/25/2008, PT DPT 04/08/2020, 7:48 AM  [facility] PHYSICAL AND SPORTS MEDICINE [address], [address], [place] Phone: [phone]   Fax:  [phone]  Name: Lacey Rodgers MRN: Suezanne Cheshire Date of Birth: 05/31/1980

## 2020-04-09 ENCOUNTER — Other Ambulatory Visit: Payer: Self-pay

## 2020-04-09 MED FILL — Escitalopram Oxalate Tab 20 MG (Base Equiv): ORAL | 30 days supply | Qty: 30 | Fill #0 | Status: AC

## 2020-04-12 ENCOUNTER — Ambulatory Visit (INDEPENDENT_AMBULATORY_CARE_PROVIDER_SITE_OTHER): Payer: No Typology Code available for payment source | Admitting: Family Medicine

## 2020-04-12 ENCOUNTER — Other Ambulatory Visit: Payer: Self-pay

## 2020-04-12 ENCOUNTER — Encounter: Payer: Self-pay | Admitting: Family Medicine

## 2020-04-12 VITALS — BP 130/80 | HR 78 | Temp 97.8°F | Ht 59.5 in | Wt 228.5 lb

## 2020-04-12 DIAGNOSIS — M549 Dorsalgia, unspecified: Secondary | ICD-10-CM

## 2020-04-12 DIAGNOSIS — G8929 Other chronic pain: Secondary | ICD-10-CM

## 2020-04-12 DIAGNOSIS — G5621 Lesion of ulnar nerve, right upper limb: Secondary | ICD-10-CM | POA: Diagnosis not present

## 2020-04-12 DIAGNOSIS — M542 Cervicalgia: Secondary | ICD-10-CM

## 2020-04-12 DIAGNOSIS — R200 Anesthesia of skin: Secondary | ICD-10-CM

## 2020-04-12 DIAGNOSIS — G5602 Carpal tunnel syndrome, left upper limb: Secondary | ICD-10-CM

## 2020-04-12 NOTE — Progress Notes (Signed)
Liv Rallis T. Latrina Guttman, MD, CAQ Sports Medicine  Primary Care and Sports Medicine [facility] [address] [place], [address]  Phone: [phone]  FAX: [phone]  [name] - 40 y.o. female  MRN [id number]  Date of Birth: 07-Dec-1980  Date: 04/12/2020  PCP: [name], MD  Referral: [name], MD  Chief Complaint  Patient presents with  . Follow-up    Back Pain/Carpal Tunnel    This visit occurred during the SARS-CoV-2 public health emergency.  Safety protocols were in place, including screening questions prior to the visit, additional usage of staff PPE, and extensive cleaning of exam room while observing appropriate contact time as indicated for disinfecting solutions.   Subjective:   Lacey Rodgers is a 40 y.o. very pleasant female patient with Body mass index is 45.38 kg/m. who presents with the following:  She is here to follow-up on her neck and back pain.  She also does continue to have some numbness and tingling in both upper extremities.  She has gone to formal PT intervally.  Both her back and neck are feeling quite a bit better.  She has been diligent with home rehab as well, and she is feeling much more optimistic.  She is having some ongoing right upper extremity tingling from the elbow down, and she is having some left-sided hand and wrist numbness and tingling.  She has nontraumatic nighttime splint for either arm.  She has not tried a cubital tunnel splint, but she has rearranged her arms and elbows in the bed to try to alleviate symptoms.  She does not place her elbow on any kind of rest when she is driving to decrease symptoms.  She does feel like she has had some decreased motor control particularly on the right side.  03/01/2020 Last OV with [name], MD  She is here with multiple MSK complaints with some associated neurological symptoms.   R arm pain and numbness:  She does have pain  and tingling in an ulnar distribution on the right UE, and this is from the elbow to the hand.  This does get quite a bit worse when she is driving and with sleep.  Shaking her hand and arm does provide some relief.  She has not tried to do anything to help with this at home.   She also has some tingling in a medial distribution on the R >> L with use and compression of the wrist.  Has not tried splints or other intervention.   Back pain:  She has also had a history of intermittent chronic back pain and neck pain with a very significant history of scoliosis.  2020 lumbar spine films do note:  16 degree levoconvex scoliosis with mild rotatory component.  Back pain has progressed over time, and now it is hurting more without radicular symptoms or significant LE numbness.   CTS on the R Ulnar Neuro on the R Cervical on the right - localized Chronic LBP on the right   Back x-ray today She has never done any formal physical therapy.   Review of Systems is noted in the HPI, as appropriate   Objective:   BP 130/80   Pulse 78   Temp 97.8 F (36.6 C) (Temporal)   Ht 4' 11.5" (1.511 m)   Wt 228 lb 8 oz (103.6 kg)   LMP 03/31/2020 (Approximate)   SpO2 95%   BMI 45.38 kg/m   Both cervical spine and  lumbar spine exam is much improved compared to the last examination.  Cervical spine range of motion is improved to grossly normal.  She is not having any significant posterior paraspinal or upper trap pain at this point.  She also is having mild tenderness in the L5-S1 region only.  Range of motion of the lumbar spine is also normal as well as straight leg raise and otherwise she is neurovascularly intact in the lower extremities.  Left upper extremity has full range of motion at the shoulder and elbow with 5/5 and all aspects of the left upper extremity, but grip strength is approximately 4+/5. Tinel's of the elbow is negative. Tinel's and Phalen's at the wrist is positive.  Otherwise, the entire  bony anatomy of the hand and wrist is nontender.  Radius and ulna are nontender.  Right upper extremity: Full range of motion at the shoulder and elbow.  Strength is 5/5 throughout with approximately 4+/5 in terms of grip strength. She does have some tingling and has some decreased sensation to soft touch and pinprick in an ulnar distribution from the elbow down through the hand. Symptoms are worsened with Tinel's at the elbow. Tinel's and Phalen's at the wrist provokes less symptoms compared to the elbow and less symptoms compared to the contralateral side.  Radiology: CLINICAL DATA:  Neck pain, getting worse  EXAM: CERVICAL SPINE - COMPLETE 4+ VIEW  COMPARISON:  None.  FINDINGS: There is no evidence of cervical spine fracture or prevertebral soft tissue swelling. Alignment is normal. No other significant bone abnormalities are identified.  IMPRESSION: Negative cervical spine radiographs.   Electronically Signed   By: Judie Petit.  Shick M.D.   On: 07/06/2019 14:29  CLINICAL DATA:  Chronic back pain  EXAM: LUMBAR SPINE - COMPLETE 4+ VIEW  COMPARISON:  03/14/2018  FINDINGS: Five lumbar type vertebral segments. Vertebral body heights are maintained without evidence of fracture. Similar levocurvature of the lumbar spine with apex at the L2-3 level. Mild L2-3 disc height loss. No significant facet arthropathy is evident. SI joints unremarkable.  IMPRESSION: Mild levocurvature of the lumbar spine, similar to prior.   Electronically Signed   By: Duanne Guess D.O.   On: 03/02/2020 14:07  Assessment and Plan:     ICD-10-CM   1. Right arm numbness  R20.0 Ambulatory referral to Neurology  2. Left arm numbness  R20.0 Ambulatory referral to Neurology  3. Cubital tunnel syndrome on right  G56.21 Ambulatory referral to Neurology  4. Left carpal tunnel syndrome  G56.02 Ambulatory referral to Neurology  5. Chronic back pain greater than 3 months duration  M54.9     G89.29   6. Chronic neck pain  M54.2    G89.29    Neck and back pain are doing quite a bit better after physical therapy.  I encouraged her to keep doing core work and her home rehab indefinitely at least once or twice a week.  Clinically, consistent with cubital tunnel syndrome on the right.  Concern for left-sided carpal tunnel syndrome.  Recommended using the carpal tunnel brace at nighttime.  I think the real question is how much nerve impairment is present in the upper extremities and exact location and distribution.  I am can have the patient see neurology to help more fully evaluate such as using EMG/NCV.  I appreciate their help.  Social: Functional limitation at the hand with fine motor grasp  No orders of the defined types were placed in this encounter.  Medications Discontinued During This  Encounter  Medication Reason  . meloxicam (MOBIC) 15 MG tablet Completed Course   Orders Placed This Encounter  Procedures  . Ambulatory referral to Neurology    Follow-up: No follow-ups on file.  Signed,  Elpidio Galea. Enna Warwick, MD   Outpatient Encounter Medications as of 04/12/2020  Medication Sig  . escitalopram (LEXAPRO) 20 MG tablet TAKE 1 TABLET BY MOUTH DAILY.  Marland Kitchen labetalol (NORMODYNE) 100 MG tablet TAKE 1 TABLET BY MOUTH 2 TIMES DAILY.  Marland Kitchen omeprazole (PRILOSEC) 20 MG capsule Take 1 capsule (20 mg total) by mouth daily as needed.  . valACYclovir (VALTREX) 500 MG tablet Take 1 tablet (500 mg total) by mouth 2 (two) times daily as needed.  . [DISCONTINUED] meloxicam (MOBIC) 15 MG tablet Take 1 tablet (15 mg total) by mouth daily. (Patient not taking: Reported on 03/01/2020)   No facility-administered encounter medications on file as of 04/12/2020.

## 2020-04-13 ENCOUNTER — Ambulatory Visit: Payer: No Typology Code available for payment source

## 2020-04-13 DIAGNOSIS — M546 Pain in thoracic spine: Secondary | ICD-10-CM

## 2020-04-13 DIAGNOSIS — G8929 Other chronic pain: Secondary | ICD-10-CM

## 2020-04-13 DIAGNOSIS — M545 Low back pain, unspecified: Secondary | ICD-10-CM

## 2020-04-13 NOTE — Therapy (Signed)
[place] [facility] PH[facility] SPORTS MEDICINE [address], [place], [address] Phone: [phone]   Fax:  [phone]  Physical Therapy Treatment  Patient Details  Name: Lacey Rodgers MRN: [id number] Date of Birth: [birth year] Referring Provider (PT): Copland MD   Encounter Date: 04/13/2020   PT End of Session - 04/13/20 1616    Visit Number 5    Number of Visits 17    Date for PT Re-Evaluation 06/07/20    Authorization Type [employer]    Authorization Time Period 03/16/20-06/07/20    PT Start Time 1605    PT Stop Time 1643    PT Time Calculation (min) 38 min    Equipment Utilized During Treatment Gait belt    Activity Tolerance Patient tolerated treatment well    Behavior During Therapy [facility] for tasks assessed/performed           Past Medical History:  Diagnosis Date  . Depression   . Genital herpes   . GERD (gastroesophageal reflux disease)   . Hypertension   . Migraine   . Obesity affecting pregnancy     Past Surgical History:  Procedure Laterality Date  . CESAREAN SECTION N/A 12/16/2015   Procedure: CESAREAN SECTION;  Surgeon: Conard Novak, MD;  Location: [facility] ORS;  Service: Obstetrics;  Laterality: N/A;  . CESAREAN SECTION WITH BILATERAL TUBAL LIGATION  12/02/2018   Procedure: CESAREAN SECTION WITH BILATERAL TUBAL LIGATION;  Surgeon: Natale Milch, MD;  Location: [facility] ORS;  Service: Obstetrics;;  TOB 1850 WEIGHT 7lb 5oz LENGTH 19.75 APGAR 9/9  . DILATION AND CURETTAGE OF UTERUS  01/2013    There were no vitals filed for this visit.   Subjective Assessment - 04/13/20 1608    Subjective Pt doing well in general, reports she is doing better with her pain. Pt saw her PCP yesterdya for FU, continues to get workup for her neck/arm issues.    Pertinent History PMH: C-Section: 2017, 2020; HTN;    Currently in Pain? Yes    Pain Score 1     Pain Location --   across midback lef tto right          INTERVENTION THIS DATE -DeadBug - 3 x 10 B (only LEs) *has burning pain with compression in position, however can be passively brought to position without pain -LTR in hooklying - x20 -Glute max bridge (wide and flexed) 2x10  -Bird dog in quadruped - 2x10 (UE and LE) (used 15lb free weights with towel to abate carpal tunnel pain)  -Anner Crete with elbows on elevated table - 2x10  -Hip Hikes on 1" surface - 2x10 bilat  *cues to avoid excessive hike (switch to quadratus) and only bring foot level; has burning back pain, but when height is corrected, feels less back pain  -Seated physio-ball rollout  c lateral deviation- 1x10 bilat, 1sec hold (20 total) (tightness from session abates)  -Seated physio-ball rollout  c lateral deviation- 2x30sec bilat       PT Short Term Goals - 03/16/20 1524      PT SHORT TERM GOAL #1   Title Patient will be independent HEP to continue benefits of therapy after DC    Baseline dependent with HEP    Time 6    Period Weeks    Status New             PT Long Term Goals - 03/16/20 1528      PT LONG TERM GOAL #  1   Title Patient will have a significant improvement with FOTO to indicate decreased pain along the Lumbar spine    Time 6    Period Weeks    Status New      PT LONG TERM GOAL #2   Title Patient will have a worst pain score of 3/10 to indicate signficant improvement lumbar pain on the VAS.    Baseline 6/10    Time 6    Period Weeks    Status New      PT LONG TERM GOAL #3   Title Patient will be able to stand for >1 hour to indicate signficant improvement in lumbar and thoracic function.    Baseline 5 minutes    Time 6    Period Weeks    Status New                 Plan - 04/13/20 1619    Clinical Impression Statement Continued with current plan of care as laid out in evaluation and recent prior sessions. Pt remains motivated to advance progress toward goals. Rest breaks provided as needed, pt quick to ask when needed.  Author maintains all interventions within appropriate level of intensity as not to purposefully exacerbate pain. Pt does require varying levels of assistance and cuing for completion of exercises for correct form and sometimes due to pain/weakness. Pt continues to demonstrate progress toward goals AEB progression of some interventions this date either in volume or intensity. No updates to HEP this date.    Personal Factors and Comorbidities Comorbidity 1    Comorbidities Obesity    Examination-Activity Limitations Lift;Squat    Examination-Participation Restrictions Cleaning;Occupation    Stability/Clinical Decision Making Stable/Uncomplicated    Clinical Decision Making Low    Rehab Potential Good    PT Frequency 2x / week    PT Duration 6 weeks    PT Treatment/Interventions Passive range of motion;Manual techniques;Therapeutic exercise;Therapeutic activities;Neuromuscular re-education;Joint Manipulations;Spinal Manipulations;Patient/family education;Balance training;Moist Heat;Iontophoresis 4mg /ml Dexamethasone;Cryotherapy;Dry needling    PT Next Visit Plan strengthening    PT Home Exercise Plan see education section    Consulted and Agree with Plan of Care Patient           Patient will benefit from skilled therapeutic intervention in order to improve the following deficits and impairments:  Decreased coordination,Pain,Increased muscle spasms,Decreased endurance,Decreased range of motion,Decreased strength,Hypomobility,Obesity  Visit Diagnosis: Chronic midline low back pain without sciatica  Pain in thoracic spine     Problem List Patient Active Problem List   Diagnosis Date Noted  . BMI 40.0-44.9, adult (HCC) 05/22/2018  . Moderate recurrent major depression (HCC) 02/08/2017  . Migraine aura, persistent 10/10/2012  . GENITAL HERPES 10/23/2008  . COMMON MIGRAINE 10/23/2008  . Essential hypertension 10/23/2008  . GERD 10/23/2008   4:43 PM, 04/13/20 06/13/20, PT,  DPT Physical Therapist - [place] [phone] (Office)    Tallon Gertz C 04/13/2020, 4:27 PM  [place] [facility] PHYSICAL AND SPORTS MEDICINE [address], [place] Phone: [phone]   Fax:  [phone]  Name: SHAQUAYA WUELLNER MRN: Suezanne Cheshire Date of Birth: June 23, 1980

## 2020-04-20 ENCOUNTER — Ambulatory Visit: Payer: No Typology Code available for payment source

## 2020-04-28 ENCOUNTER — Ambulatory Visit: Payer: No Typology Code available for payment source

## 2020-04-28 ENCOUNTER — Other Ambulatory Visit: Payer: Self-pay

## 2020-04-28 DIAGNOSIS — G8929 Other chronic pain: Secondary | ICD-10-CM

## 2020-04-28 DIAGNOSIS — M545 Low back pain, unspecified: Secondary | ICD-10-CM | POA: Diagnosis not present

## 2020-04-28 NOTE — Therapy (Signed)
[place] [facility] PHYSICAL [facility] SPORTS MEDICINE [address], [place], [address] Phone: [phone]   Fax:  [phone]  Physical Therapy Treatment  Patient Details  Name: Lacey Rodgers MRN: [id number] Date of Birth: 1980/09/01 Referring Provider (PT): Copland MD   Encounter Date: 04/28/2020   PT End of Session - 04/28/20 1653    Visit Number 6    Number of Visits 17    Date for PT Re-Evaluation 06/07/20    Authorization Type [employer]    Authorization Time Period 03/16/20-06/07/20    PT Start Time 1648    PT Stop Time 1729    PT Time Calculation (min) 41 min    Equipment Utilized During Treatment Gait belt    Activity Tolerance Patient tolerated treatment well    Behavior During Therapy [facility] for tasks assessed/performed           Past Medical History:  Diagnosis Date  . Depression   . Genital herpes   . GERD (gastroesophageal reflux disease)   . Hypertension   . Migraine   . Obesity affecting pregnancy     Past Surgical History:  Procedure Laterality Date  . CESAREAN SECTION N/A 12/16/2015   Procedure: CESAREAN SECTION;  Surgeon: Conard Novak, MD;  Location: [facility] ORS;  Service: Obstetrics;  Laterality: N/A;  . CESAREAN SECTION WITH BILATERAL TUBAL LIGATION  12/02/2018   Procedure: CESAREAN SECTION WITH BILATERAL TUBAL LIGATION;  Surgeon: Natale Milch, MD;  Location: [facility] ORS;  Service: Obstetrics;;  TOB 1850 WEIGHT 7lb 5oz LENGTH 19.75 APGAR 9/9  . DILATION AND CURETTAGE OF UTERUS  01/2013    There were no vitals filed for this visit.   Subjective Assessment - 04/28/20 1651    Subjective Pt reports stiffness and pain in low back recorded at 5/10 NPS. reports could be an increase in pain from increased standing.    Pertinent History PMH: C-Section: 2017, 2020; HTN;    Currently in Pain? Yes    Pain Score 5     Pain Orientation Lower    Pain Descriptors / Indicators Aching             There.ex:   Exercises in supine:    Post pelvic tilts: 2x20    LTR's: 2x20    Knees to chest: 1x10    Bridges: 2x15. VC for post tilt prior to bridging due to compression pain reported with descent. Reports improvement after post tilt incorporated.   Dead bug isometrics with arms and hips at 90 deg: 10x10 sec holds.      Seated exercise ball roll outs with silver exercise ball:    Forward: x12/direction   Lateral deviation R/L: x12/direction   Cat/cow In quadruped for thoracic mobility: 2x10. VC's for incorporating increased post/ant pelvic tilt with exercise. Good carryover after.    Open books R/L for thoracic mobility: x10/direction. VC's for LE placement to increase stretch with rotation.    PT Education - 04/28/20 1653    Education Details form/technique with exercise.    Person(s) Educated Patient    Methods Explanation;Demonstration    Comprehension Verbalized understanding;Returned demonstration            PT Short Term Goals - 03/16/20 1524      PT SHORT TERM GOAL #1   Title Patient will be independent HEP to continue benefits of therapy after DC    Baseline dependent with HEP    Time 6    Period Weeks  Status New             PT Long Term Goals - 03/16/20 1528      PT LONG TERM GOAL #1   Title Patient will have a significant improvement with FOTO to indicate decreased pain along the Lumbar spine    Time 6    Period Weeks    Status New      PT LONG TERM GOAL #2   Title Patient will have a worst pain score of 3/10 to indicate signficant improvement lumbar pain on the VAS.    Baseline 6/10    Time 6    Period Weeks    Status New      PT LONG TERM GOAL #3   Title Patient will be able to stand for >1 hour to indicate signficant improvement in lumbar and thoracic function.    Baseline 5 minutes    Time 6    Period Weeks    Status New           Patient will benefit from skilled therapeutic intervention in order to improve the following  deficits and impairments:  Decreased coordination,Pain,Increased muscle spasms,Decreased endurance,Decreased range of motion,Decreased strength,Hypomobility,Obesity  Visit Diagnosis: Chronic midline low back pain without sciatica     Problem List Patient Active Problem List   Diagnosis Date Noted  . BMI 40.0-44.9, adult (HCC) 05/22/2018  . Moderate recurrent major depression (HCC) 02/08/2017  . Migraine aura, persistent 10/10/2012  . GENITAL HERPES 10/23/2008  . COMMON MIGRAINE 10/23/2008  . Essential hypertension 10/23/2008  . GERD 10/23/2008    Delphia Grates. Fairly IV, PT, DPT Physical Therapist- [place]  [facility]  04/28/2020, 5:36 PM  [place] [facility] PHYSICAL AND SPORTS MEDICINE [address] [place], [address] Phone: [phone]   Fax:  [phone]  Name: Lacey Rodgers MRN: [id number] Date of Birth: 1980/07/10

## 2020-05-05 ENCOUNTER — Other Ambulatory Visit: Payer: Self-pay

## 2020-05-05 ENCOUNTER — Ambulatory Visit: Payer: No Typology Code available for payment source | Attending: Family Medicine

## 2020-05-05 DIAGNOSIS — M545 Low back pain, unspecified: Secondary | ICD-10-CM | POA: Insufficient documentation

## 2020-05-05 DIAGNOSIS — G8929 Other chronic pain: Secondary | ICD-10-CM | POA: Insufficient documentation

## 2020-05-05 DIAGNOSIS — M546 Pain in thoracic spine: Secondary | ICD-10-CM | POA: Diagnosis present

## 2020-05-05 NOTE — Therapy (Signed)
[place] [facility] PHYS[facility] MEDICINE [address], [place], [address] Phone: [phone]   Fax:  [phone]  Physical Therapy Treatment  Patient Details  Name: Lacey Rodgers MRN: [id number] Date of Birth: 21-Jan-1980 Referring Provider (PT): Copland MD   Encounter Date: 05/05/2020   PT End of Session - 05/05/20 1707    Visit Number 7    Number of Visits 17    Date for PT Re-Evaluation 06/07/20    Authorization Type [employer]    Authorization Time Period 03/16/20-06/07/20    PT Start Time 1647    PT Stop Time 1729    PT Time Calculation (min) 42 min    Equipment Utilized During Treatment Gait belt    Activity Tolerance Patient tolerated treatment well    Behavior During Therapy [facility] for tasks assessed/performed           Past Medical History:  Diagnosis Date  . Depression   . Genital herpes   . GERD (gastroesophageal reflux disease)   . Hypertension   . Migraine   . Obesity affecting pregnancy     Past Surgical History:  Procedure Laterality Date  . CESAREAN SECTION N/A 12/16/2015   Procedure: CESAREAN SECTION;  Surgeon: Conard Novak, MD;  Location: [facility] ORS;  Service: Obstetrics;  Laterality: N/A;  . CESAREAN SECTION WITH BILATERAL TUBAL LIGATION  12/02/2018   Procedure: CESAREAN SECTION WITH BILATERAL TUBAL LIGATION;  Surgeon: Natale Milch, MD;  Location: [facility] ORS;  Service: Obstetrics;;  TOB 1850 WEIGHT 7lb 5oz LENGTH 19.75 APGAR 9/9  . DILATION AND CURETTAGE OF UTERUS  01/2013    There were no vitals filed for this visit.   Subjective Assessment - 05/05/20 1706    Subjective Pt reports 1/10 NPS in low back today. Has improved over the week compared to previous session. Does report a "twinge in her neck" on the right side and believes she may have "slept funny".    Pertinent History PMH: C-Section: 2017, 2020; HTN;    Currently in Pain? Yes    Pain Score 1     Pain Location Back     Pain Orientation Lower    Pain Descriptors / Indicators Aching    Pain Type Chronic pain    Pain Onset More than a month ago    Pain Frequency Intermittent            There.ex:     Pt educated on gentle cervical mobility to help with her acute cervical muscular pain.    Seated exercise ball roll outs with silver exercise ball:                          Forward: x12/direction                         Lateral deviation R/L: x12/direction    Exercises in supine:                          Post pelvic tilts: 2x20                          LTR's: 2x20                           Bridges: 2x15. VC for post tilt prior to  bridging due to compression pain reported with descent. Reports improvement after post tilt incorporated.   Dead bug isometrics with arms and hips at 90 deg: 4x15 sec holds.    Bicycles: 3x10/LE for core and hip strength    PT Education - 05/05/20 1707    Education Details form/technique with exercise.    Person(s) Educated Patient    Methods Explanation;Demonstration;Tactile cues;Verbal cues    Comprehension Verbalized understanding;Returned demonstration            PT Short Term Goals - 03/16/20 1524      PT SHORT TERM GOAL #1   Title Patient will be independent HEP to continue benefits of therapy after DC    Baseline dependent with HEP    Time 6    Period Weeks    Status New             PT Long Term Goals - 03/16/20 1528      PT LONG TERM GOAL #1   Title Patient will have a significant improvement with FOTO to indicate decreased pain along the Lumbar spine    Time 6    Period Weeks    Status New      PT LONG TERM GOAL #2   Title Patient will have a worst pain score of 3/10 to indicate signficant improvement lumbar pain on the VAS.    Baseline 6/10    Time 6    Period Weeks    Status New      PT LONG TERM GOAL #3   Title Patient will be able to stand for >1 hour to indicate signficant improvement in lumbar and thoracic function.    Baseline 5  minutes    Time 6    Period Weeks    Status New                 Plan - 05/05/20 1717    Clinical Impression Statement Pt continued to progress core stabilization exercises to improve core strength. Pt reports improvement in twinge in her neck after therapy session. Pt displaysing good carryover with form/tehcnique indicative pt performing HEP at home intermittently. Pt can continue to benefit from skilled PT services to progress spinal mobility, core stability, and LE strength.    Personal Factors and Comorbidities Comorbidity 1    Comorbidities Obesity    Examination-Activity Limitations Lift;Squat    Examination-Participation Restrictions Cleaning;Occupation    Stability/Clinical Decision Making Stable/Uncomplicated    Rehab Potential Good    PT Frequency 2x / week    PT Duration 6 weeks    PT Treatment/Interventions Passive range of motion;Manual techniques;Therapeutic exercise;Therapeutic activities;Neuromuscular re-education;Joint Manipulations;Spinal Manipulations;Patient/family education;Balance training;Moist Heat;Iontophoresis 4mg /ml Dexamethasone;Cryotherapy;Dry needling    PT Next Visit Plan strengthening    PT Home Exercise Plan see education section    Consulted and Agree with Plan of Care Patient           Patient will benefit from skilled therapeutic intervention in order to improve the following deficits and impairments:  Decreased coordination,Pain,Increased muscle spasms,Decreased endurance,Decreased range of motion,Decreased strength,Hypomobility,Obesity  Visit Diagnosis: Chronic midline low back pain without sciatica     Problem List Patient Active Problem List   Diagnosis Date Noted  . BMI 40.0-44.9, adult (HCC) 05/22/2018  . Moderate recurrent major depression (HCC) 02/08/2017  . Migraine aura, persistent 10/10/2012  . GENITAL HERPES 10/23/2008  . COMMON MIGRAINE 10/23/2008  . Essential hypertension 10/23/2008  . GERD 10/23/2008    10/25/2008.  Fairly IV, PT,  DPT Physical Therapist- [facility]  [facility]  05/05/2020, 5:39 PM  [facility] PHYSICAL AND SPORTS MEDICINE [address] [place], [address] Phone: [phone]   Fax:  [phone]  Name: Lacey Rodgers MRN: [id number] Date of Birth: 28-Jul-1980

## 2020-05-10 ENCOUNTER — Ambulatory Visit: Payer: No Typology Code available for payment source

## 2020-05-10 ENCOUNTER — Other Ambulatory Visit: Payer: Self-pay

## 2020-05-10 DIAGNOSIS — G8929 Other chronic pain: Secondary | ICD-10-CM

## 2020-05-10 DIAGNOSIS — M545 Low back pain, unspecified: Secondary | ICD-10-CM

## 2020-05-10 NOTE — Therapy (Signed)
[place] [facility] PH[facility]ORTS MEDICINE [address], [place], [address] Phone: [phone]   Fax:  [phone]  Physical Therapy Treatment  Patient Details  Name: Lacey Rodgers MRN: [id number] Date of Birth: 1980/07/23 Referring Provider (PT): Copland MD   Encounter Date: 05/10/2020   PT End of Session - 05/10/20 1711    Visit Number 8    Number of Visits 17    Date for PT Re-Evaluation 06/07/20    Authorization Type [employer]    Authorization Time Period 03/16/20-06/07/20    PT Start Time 1648    PT Stop Time 1726    PT Time Calculation (min) 38 min    Equipment Utilized During Treatment Gait belt    Activity Tolerance Patient tolerated treatment well    Behavior During Therapy [facility] for tasks assessed/performed           Past Medical History:  Diagnosis Date  . Depression   . Genital herpes   . GERD (gastroesophageal reflux disease)   . Hypertension   . Migraine   . Obesity affecting pregnancy     Past Surgical History:  Procedure Laterality Date  . CESAREAN SECTION N/A 12/16/2015   Procedure: CESAREAN SECTION;  Surgeon: Conard Novak, MD;  Location: [facility] ORS;  Service: Obstetrics;  Laterality: N/A;  . CESAREAN SECTION WITH BILATERAL TUBAL LIGATION  12/02/2018   Procedure: CESAREAN SECTION WITH BILATERAL TUBAL LIGATION;  Surgeon: Natale Milch, MD;  Location: [facility] ORS;  Service: Obstetrics;;  TOB 1850 WEIGHT 7lb 5oz LENGTH 19.75 APGAR 9/9  . DILATION AND CURETTAGE OF UTERUS  01/2013    There were no vitals filed for this visit.   Subjective Assessment - 05/10/20 1653    Subjective Pt reports 3/10 NPS in low back. Mid back hurts where her "crick in her neck" has still been located.    Pertinent History PMH: C-Section: 2017, 2020; HTN;    Currently in Pain? Yes    Pain Score 4     Pain Location Back    Pain Descriptors / Indicators Aching    Pain Onset More than a month ago           Manual therapy:   STM for 20 min to B occiput at upper trap insertion, R sided upper trapezius at site of concordant pain (active trigger point ant to clavicle), rhomboids, and levator scap to improve cervical mobility and pain.   Supine R UT stretch with R cervical rotation and overpressure at R shoulder: 3x30 sec holds.    There.ex:   In prone   Shoulder Y's: Bilat, 2x20. VC's/TC's for form/technique   Shoulder T's: Bilat, 2x20. VC's/TC's for form/technique  Seated   R cervical lat flexion isometrics: 3x8 sec holds. Educated on pain modulation and form/technique with PT and TC/VC.    PT Education - 05/10/20 1711    Education Details form/technique with exercise.    Person(s) Educated Patient    Methods Explanation;Demonstration;Tactile cues;Verbal cues    Comprehension Verbalized understanding;Returned demonstration            PT Short Term Goals - 03/16/20 1524      PT SHORT TERM GOAL #1   Title Patient will be independent HEP to continue benefits of therapy after DC    Baseline dependent with HEP    Time 6    Period Weeks    Status New  PT Long Term Goals - 03/16/20 1528      PT LONG TERM GOAL #1   Title Patient will have a significant improvement with FOTO to indicate decreased pain along the Lumbar spine    Time 6    Period Weeks    Status New      PT LONG TERM GOAL #2   Title Patient will have a worst pain score of 3/10 to indicate signficant improvement lumbar pain on the VAS.    Baseline 6/10    Time 6    Period Weeks    Status New      PT LONG TERM GOAL #3   Title Patient will be able to stand for >1 hour to indicate signficant improvement in lumbar and thoracic function.    Baseline 5 minutes    Time 6    Period Weeks    Status New                 Plan - 05/10/20 1747    Clinical Impression Statement Due to pt having continued neck discomfort from "crick in her neck" focus on sesison to decrease acute neck pain. Painful R  lat flexion and noted active trigger point in R upper trap near anterior portion superior to clavical that is concordant. Utilization of STM, cervical isometrics, and lower/middle trap exercises to improve pain. Post treatment pt reports the knot has loosened but still having dififcutly with pain when performing R sided lateral cervical flexion. Pt educated on tissue healing guidlines and time frame and reassured pt that it is positive that her symptoms are improving without intervention despite taking multiple days. Will benefit from skilled PT treatment to improve strength and core stability so pt can return to PLOF without LBP.    Personal Factors and Comorbidities Comorbidity 1    Comorbidities Obesity    Examination-Activity Limitations Lift;Squat    Examination-Participation Restrictions Cleaning;Occupation    Stability/Clinical Decision Making Stable/Uncomplicated    Rehab Potential Good    PT Frequency 2x / week    PT Duration 6 weeks    PT Treatment/Interventions Passive range of motion;Manual techniques;Therapeutic exercise;Therapeutic activities;Neuromuscular re-education;Joint Manipulations;Spinal Manipulations;Patient/family education;Balance training;Moist Heat;Iontophoresis 4mg /ml Dexamethasone;Cryotherapy;Dry needling    PT Next Visit Plan strengthening    PT Home Exercise Plan see education section    Consulted and Agree with Plan of Care Patient           Patient will benefit from skilled therapeutic intervention in order to improve the following deficits and impairments:  Decreased coordination,Pain,Increased muscle spasms,Decreased endurance,Decreased range of motion,Decreased strength,Hypomobility,Obesity  Visit Diagnosis: Chronic midline low back pain without sciatica     Problem List Patient Active Problem List   Diagnosis Date Noted  . BMI 40.0-44.9, adult (HCC) 05/22/2018  . Moderate recurrent major depression (HCC) 02/08/2017  . Migraine aura, persistent  10/10/2012  . GENITAL HERPES 10/23/2008  . COMMON MIGRAINE 10/23/2008  . Essential hypertension 10/23/2008  . GERD 10/23/2008    10/25/2008. Fairly IV, PT, DPT Physical Therapist- [place]  [facility]  05/10/2020, 5:56 PM  [place] [facility] PHYSICAL AND SPORTS MEDICINE [address], [address], [place] Phone: [phone]   Fax:  [phone]  Name: MEGHANNE PLETZ MRN: Suezanne Cheshire Date of Birth: [birth year]

## 2020-05-11 MED FILL — Escitalopram Oxalate Tab 20 MG (Base Equiv): ORAL | 30 days supply | Qty: 30 | Fill #1 | Status: AC

## 2020-05-12 ENCOUNTER — Other Ambulatory Visit: Payer: Self-pay

## 2020-05-26 ENCOUNTER — Other Ambulatory Visit: Payer: Self-pay

## 2020-05-26 ENCOUNTER — Ambulatory Visit: Payer: No Typology Code available for payment source

## 2020-05-26 DIAGNOSIS — M545 Low back pain, unspecified: Secondary | ICD-10-CM | POA: Diagnosis not present

## 2020-05-26 DIAGNOSIS — M546 Pain in thoracic spine: Secondary | ICD-10-CM

## 2020-05-26 NOTE — Therapy (Signed)
[place] PHYSICAL AND SPORTS MEDICINE [address], [place], [address] Phone: [phone]   Fax:  [phone]  Physical Therapy Treatment/Physical Therapy Progress Note  Dates of reporting period 03/15/20   to   05/26/20   Patient Details  Name: Lacey Rodgers MRN: [id number] Date of Birth: Jan 08, 1980 Referring Provider (PT): Copland MD   Encounter Date: 05/26/2020   PT End of Session - 05/26/20 1611    Visit Number 9    Number of Visits 17    Date for PT Re-Evaluation 06/07/20    Authorization Type [employer]    Authorization Time Period 03/16/20-06/07/20    PT Start Time 1603    PT Stop Time 1647    PT Time Calculation (min) 44 min    Equipment Utilized During Treatment Gait belt    Activity Tolerance Patient tolerated treatment well    Behavior During Therapy WFL for tasks assessed/performed           Past Medical History:  Diagnosis Date  . Depression   . Genital herpes   . GERD (gastroesophageal reflux disease)   . Hypertension   . Migraine   . Obesity affecting pregnancy     Past Surgical History:  Procedure Laterality Date  . CESAREAN SECTION N/A 12/16/2015   Procedure: CESAREAN SECTION;  Surgeon: Will Bonnet, MD;  Location: [facility] ORS;  Service: Obstetrics;  Laterality: N/A;  . CESAREAN SECTION WITH BILATERAL TUBAL LIGATION  12/02/2018   Procedure: CESAREAN SECTION WITH BILATERAL TUBAL LIGATION;  Surgeon: Homero Fellers, MD;  Location: [facility] ORS;  Service: Obstetrics;;  TOB 1850 WEIGHT 7lb 5oz LENGTH 19.75 APGAR 9/9  . DILATION AND CURETTAGE OF UTERUS  01/2013    There were no vitals filed for this visit.   Subjective Assessment - 05/26/20 1606    Subjective Pt reports 2/10 NPS in Low back. Continued pain in neck reported at 6/10 NPS that has not gotten better. Plans on reaching out to MD about neck pain.    Pertinent History PMH: C-Section: 2017, 2020; HTN;    Currently in Pain? Yes     Pain Score 2     Pain Location Back    Pain Orientation Lower    Pain Descriptors / Indicators Aching    Pain Onset More than a month ago    Pain Frequency Intermittent           There.ex:                First 15 min on updating goals for progress note.   Hook lying bridges with isometric hold on blue TB around distal femurs: 2x12. Initial VC's for form/technique.   Hook lying hip ER/abd with blue TB around distal femurs: 2x12  Dead bug isometrics with arms and hips at 90 deg: 5x15 sec             Hook lying core/LE exercises on Red physioball:    Hamstring curls: 2x12   Bridges with post pelvic tilt: 2x12  Paloff walkouts with RTB: x5/direction with 3 steps. Good form/technique.      Patient's condition has the potential to improve in response to therapy. Maximum improvement is yet to be obtained. The anticipated improvement is attainable and reasonable in a generally predictable time.   PT Education - 05/26/20 1607    Education Details form/technique with exercise.    Person(s) Educated Patient    Methods Explanation;Demonstration;Tactile cues;Verbal cues  Comprehension Verbalized understanding;Returned demonstration            PT Short Term Goals - 05/26/20 1612      PT SHORT TERM GOAL #1   Title Patient will be independent HEP to continue benefits of therapy after DC    Baseline dependent with HEP; 5/25: Compliant 5 days/week.    Time 6    Period Weeks    Status Achieved    Target Date 05/26/20             PT Long Term Goals - 05/26/20 1613      PT LONG TERM GOAL #1   Title Patient will have a significant improvement with FOTO to indicate decreased pain along the Lumbar spine    Baseline At eval: 49/61; 5/25: 67/61    Time 6    Period Weeks    Status Achieved    Target Date 05/26/20      PT LONG TERM GOAL #2   Title Patient will have a worst pain score of 3/10 to indicate signficant improvement lumbar pain on the VAS.    Baseline 6/10; 05/26/20:  4/10 VAS    Time 6    Period Weeks    Status Partially Met    Target Date 06/07/20      PT LONG TERM GOAL #3   Title Patient will be able to stand for >1 hour to indicate signficant improvement in lumbar and thoracic function.    Baseline 5 minutes pain at 6/10; 5/25: Can stand > 1 hour pain at 1-2/10 NPS    Time 6    Period Weeks    Status Partially Met    Target Date 06/07/20                 Plan - 05/26/20 1627    Clinical Impression Statement Pt educated to contact PCP due to persistent neck pain for ~ 1 month now that has not improved. Progress note today since near 10th visit and have not seen patient since early this month. Pt making progress otwards goals with exceeding FOTO score, ability to stand > 1 hour with less LBP, and consistent with HEP. These findings indicative of improvements in functional mobility with work and home tasks. Pt will continue to benefit from skilled PT services to address pain, weakness, and tolerance to work tasks to return to PLOF.    Personal Factors and Comorbidities Comorbidity 1    Comorbidities Obesity    Examination-Activity Limitations Lift;Squat    Examination-Participation Restrictions Cleaning;Occupation    Stability/Clinical Decision Making Stable/Uncomplicated    Rehab Potential Good    PT Frequency 2x / week    PT Duration 6 weeks    PT Treatment/Interventions Passive range of motion;Manual techniques;Therapeutic exercise;Therapeutic activities;Neuromuscular re-education;Joint Manipulations;Spinal Manipulations;Patient/family education;Balance training;Moist Heat;Iontophoresis 46m/ml Dexamethasone;Cryotherapy;Dry needling    PT Next Visit Plan strengthening    PT Home Exercise Plan see education section    Consulted and Agree with Plan of Care Patient           Patient will benefit from skilled therapeutic intervention in order to improve the following deficits and impairments:  Decreased coordination,Pain,Increased muscle  spasms,Decreased endurance,Decreased range of motion,Decreased strength,Hypomobility,Obesity  Visit Diagnosis: Chronic midline low back pain without sciatica  Pain in thoracic spine     Problem List Patient Active Problem List   Diagnosis Date Noted  . BMI 40.0-44.9, adult ([facility] 05/22/2018  . Mod[place] 02/08/2017  . Migraine aura, persistent  10/10/2012  . GENITAL HERPES 10/23/2008  . COMMON MIGRAINE 10/23/2008  . Essential hypertension 10/23/2008  . GERD 10/23/2008    Salem Caster. Fairly IV, PT, DPT Physical Therapist- [facility]  05/26/2020, 5:05 PM  [place] PHYSICAL AND SPORTS MEDICINE [address], [place], [address] Phone: [phone]   Fax:  [phone]  Name: Lacey Rodgers MRN: [id number] Date of Birth: 11-16-1980

## 2020-06-02 ENCOUNTER — Other Ambulatory Visit: Payer: Self-pay

## 2020-06-02 ENCOUNTER — Ambulatory Visit: Payer: No Typology Code available for payment source | Attending: Family Medicine

## 2020-06-02 DIAGNOSIS — G8929 Other chronic pain: Secondary | ICD-10-CM | POA: Insufficient documentation

## 2020-06-02 DIAGNOSIS — M546 Pain in thoracic spine: Secondary | ICD-10-CM | POA: Insufficient documentation

## 2020-06-02 DIAGNOSIS — M545 Low back pain, unspecified: Secondary | ICD-10-CM | POA: Insufficient documentation

## 2020-06-02 NOTE — Therapy (Signed)
[facility] [address] [place], [address] Phone: [phone]   Fax:  [phone]  Physical Therapy Treatment/Physical Therapy Progress Note   Dates of reporting period  03/15/20   to   06/02/20  Patient Details  Name: Lacey Rodgers MRN: [id number] Date of Birth: May 16, 1980 Referring Provider (PT): Copland MD   Encounter Date: 06/02/2020   PT End of Session - 06/02/20 1608    Visit Number 10    Number of Visits 17    Date for PT Re-Evaluation 06/07/20    Authorization Type [employer]    Authorization Time Period 03/16/20-06/07/20    PT Start Time 1601    PT Stop Time 1646    PT Time Calculation (min) 45 min    Equipment Utilized During Treatment Gait belt    Activity Tolerance Patient tolerated treatment well;No increased pain    Behavior During Therapy WFL for tasks assessed/performed           Past Medical History:  Diagnosis Date  . Depression   . Genital herpes   . GERD (gastroesophageal reflux disease)   . Hypertension   . Migraine   . Obesity affecting pregnancy     Past Surgical History:  Procedure Laterality Date  . CESAREAN SECTION N/A 12/16/2015   Procedure: CESAREAN SECTION;  Surgeon: Will Bonnet, MD;  Location: [facility];  Service: Obstetrics;  Laterality: N/A;  . CESAREAN SECTION WITH BILATERAL TUBAL LIGATION  12/02/2018   Procedure: CESAREAN SECTION WITH BILATERAL TUBAL LIGATION;  Surgeon: Homero Fellers, MD;  Location: [facility] OR[facility];  Service: Obstetrics;;  TOB 1850 WEIGHT 7lb 5oz LENGTH 19.75 APGAR 9/9  . DILATION AND CURETTAGE OF UTERUS  01/2013    There were no vitals filed for this visit.   Subjective Assessment - 06/02/20 1603    Subjective Pt reports increased LBP after being more phsyically active with wlaking and lifting her kids with 7-8/10 pain in low back. Today pain is at 1/10 NPS.    Pertinent History PMH: C-Section: 2017, 2020; HTN;     Currently in Pain? Yes    Pain Score 1     Pain Location Back    Pain Orientation Lower    Pain Descriptors / Indicators Dull;Aching    Pain Onset More than a month ago          Progress reassessed previous session. See visit 9 and clinical impression for details. Pt making progress towards goals.     There.ex:   Hook lying bridges with isometric hold on blue TB around distal femurs: 2x12. Initial VC's for form/technique.              Hook lying hip ER/abd bridge with blue TB around distal femurs: 2x12             Dead bug isometrics with arms and hips at 90 deg: 5x15 sec     Hook lying core/LE exercises on Red physioball:                          Hamstring curls: 2x12                         Bridges with post pelvic tilt: 2x12              Paloff walkouts with RTB: x5/direction with 3 steps. Good form/technique.    Paloff  presses with RTB: 1x8. Reports N/T and pain from carpal tunnel due to combined elbow flexion and wrist extension. Discontinued.      Patient's condition has the potential to improve in response to therapy. Maximum improvement is yet to be obtained. The anticipated improvement is attainable and reasonable in a generally predictable time.   PT Education - 06/02/20 1607    Education Details form/technique with exercise.    Person(s) Educated Patient    Methods Explanation;Demonstration;Verbal cues;Tactile cues    Comprehension Verbalized understanding;Returned demonstration            PT Short Term Goals - 05/26/20 1612      PT SHORT TERM GOAL #1   Title Patient will be independent HEP to continue benefits of therapy after DC    Baseline dependent with HEP; 5/25: Compliant 5 days/week.    Time 6    Period Weeks    Status Achieved    Target Date 05/26/20             PT Long Term Goals - 05/26/20 1613      PT LONG TERM GOAL #1   Title Patient will have a significant improvement with FOTO to indicate decreased pain along the Lumbar spine     Baseline At eval: 49/61; 5/25: 67/61    Time 6    Period Weeks    Status Achieved    Target Date 05/26/20      PT LONG TERM GOAL #2   Title Patient will have a worst pain score of 3/10 to indicate signficant improvement lumbar pain on the VAS.    Baseline 6/10; 05/26/20: 4/10 VAS    Time 6    Period Weeks    Status Partially Met    Target Date 06/07/20      PT LONG TERM GOAL #3   Title Patient will be able to stand for >1 hour to indicate signficant improvement in lumbar and thoracic function.    Baseline 5 minutes pain at 6/10; 5/25: Can stand > 1 hour pain at 1-2/10 NPS    Time 6    Period Weeks    Status Partially Met    Target Date 06/07/20                 Plan - 06/02/20 1653    Clinical Impression Statement Pt progressed in core and LE stability and strength exercises with no increase in LBP. Pt demonstrating good form/technique with exercises. Progress note reassessed previous session. Pt is making progress towards her goals. PLease refer to previous clinical impression for updates. PT will continue to improve strength, mobility, and pain so pt can return to PLOF.    Personal Factors and Comorbidities Comorbidity 1    Comorbidities Obesity    Examination-Activity Limitations Lift;Squat    Examination-Participation Restrictions Cleaning;Occupation    Stability/Clinical Decision Making Stable/Uncomplicated    Rehab Potential Good    PT Frequency 2x / week    PT Duration 6 weeks    PT Treatment/Interventions Passive range of motion;Manual techniques;Therapeutic exercise;Therapeutic activities;Neuromuscular re-education;Joint Manipulations;Spinal Manipulations;Patient/family education;Balance training;Moist Heat;Iontophoresis 66m/ml Dexamethasone;Cryotherapy;Dry needling    PT Next Visit Plan strengthening    PT Home Exercise Plan see education section    Consulted and Agree with Plan of Care Patient           Patient will benefit from skilled therapeutic  intervention in order to improve the following deficits and impairments:  Decreased coordination,Pain,Increased muscle spasms,Decreased endurance,Decreased range of motion,Decreased strength,Hypomobility,Obesity  Visit Diagnosis: Chronic midline low back pain without sciatica     Problem List Patient Active Problem List   Diagnosis Date Noted  . BMI 40.0-44.9, adult ([place]) 05/22/2018  . Moderate recurrent major depression ([place]) 02/08/2017  . Migraine aura, persistent 10/10/2012  . GENITAL HERPES 10/23/2008  . COMMON MIGRAINE 10/23/2008  . Essential hypertension 10/23/2008  . GERD 10/23/2008    Salem Caster. Fairly IV, PT, DPT Physical Therapist- [facility]  06/02/2020, 5:00 PM  [facility] [address] [place], [address] Phone: [phone]   Fax:  [phone]  Name: Lacey Rodgers MRN: [id number] Date of Birth: 10-11-1980

## 2020-06-07 ENCOUNTER — Ambulatory Visit: Payer: No Typology Code available for payment source

## 2020-06-07 ENCOUNTER — Other Ambulatory Visit: Payer: Self-pay

## 2020-06-07 DIAGNOSIS — M545 Low back pain, unspecified: Secondary | ICD-10-CM | POA: Diagnosis not present

## 2020-06-07 DIAGNOSIS — G8929 Other chronic pain: Secondary | ICD-10-CM

## 2020-06-07 NOTE — Therapy (Signed)
[facility] [address], [place], [address] Phone: [phone]   Fax:  [phone]  Physical Therapy Treatment  Patient Details  Name: Lacey Rodgers MRN: [id number] Date of Birth: 08/27/1980 Referring Provider (PT): Copland MD   Encounter Date: 06/07/2020   PT End of Session - 06/07/20 1606    Visit Number 11    Number of Visits 17    Date for PT Re-Evaluation 06/07/20    Authorization Type [employer]    Authorization Time Period 03/16/20-06/07/20    PT Start Time 1602    PT Stop Time 8032    PT Time Calculation (min) 42 min    Equipment Utilized During Treatment Gait belt    Activity Tolerance Patient tolerated treatment well;No increased pain    Behavior During Therapy WFL for tasks assessed/performed           Past Medical History:  Diagnosis Date  . Depression   . Genital herpes   . GERD (gastroesophageal reflux disease)   . Hypertension   . Migraine   . Obesity affecting pregnancy     Past Surgical History:  Procedure Laterality Date  . CESAREAN SECTION N/A 12/16/2015   Procedure: CESAREAN SECTION;  Surgeon: Will Bonnet, MD;  Location: [facility];  Service: Obstetrics;  Laterality: N/A;  . CESAREAN SECTION WITH BILATERAL TUBAL LIGATION  12/02/2018   Procedure: CESAREAN SECTION WITH BILATERAL TUBAL LIGATION;  Surgeon: Homero Fellers, MD;  Location: [facility];  Service: Obstetrics;;  TOB 1850 WEIGHT 7lb 5oz LENGTH 19.75 APGAR 9/9  . DILATION AND CURETTAGE OF UTERUS  01/2013    There were no vitals filed for this visit.   Subjective Assessment - 06/07/20 1603    Subjective Pt reports 2/10 NPS in low back. Reports battling a sinus infection. Neck pain still bothering pt.    Pertinent History PMH: C-Section: 2017, 2020; HTN;    Pain Score 2     Pain Location Back    Pain Orientation Lower    Pain Descriptors / Indicators Dull;Aching    Pain Onset More than a month  ago           There.ex:   Supine dead bug isometrics: 5x20 sec holds   Supine dead bugs: 2x5 reps with mod VC's and TC's for form/technique.   RTB paloff walk outs: 4 step outs, x5 reps/side (R/L)   Seated physioball marches: 2x12 reps. PT SBA for safety.   Ant/post pelvic tilts: 2x12 reps. Initial PT demo.    PT Education - 06/07/20 1605    Education Details form/technique with exercise. Need to RECERT or D/c next session based off of progress in PT.    Person(s) Educated Patient    Methods Explanation;Demonstration;Tactile cues;Verbal cues    Comprehension Verbalized understanding;Returned demonstration            PT Short Term Goals - 05/26/20 1612      PT SHORT TERM GOAL #1   Title Patient will be independent HEP to continue benefits of therapy after DC    Baseline dependent with HEP; 5/25: Compliant 5 days/week.    Time 6    Period Weeks    Status Achieved    Target Date 05/26/20             PT Long Term Goals - 05/26/20 1613      PT LONG TERM GOAL #1   Title Patient will have a significant improvement  with FOTO to indicate decreased pain along the Lumbar spine    Baseline At eval: 49/61; 5/25: 67/61    Time 6    Period Weeks    Status Achieved    Target Date 05/26/20      PT LONG TERM GOAL #2   Title Patient will have a worst pain score of 3/10 to indicate signficant improvement lumbar pain on the VAS.    Baseline 6/10; 05/26/20: 4/10 VAS    Time 6    Period Weeks    Status Partially Met    Target Date 06/07/20      PT LONG TERM GOAL #3   Title Patient will be able to stand for >1 hour to indicate signficant improvement in lumbar and thoracic function.    Baseline 5 minutes pain at 6/10; 5/25: Can stand > 1 hour pain at 1-2/10 NPS    Time 6    Period Weeks    Status Partially Met    Target Date 06/07/20                 Plan - 06/07/20 1705    Clinical Impression Statement Pt continuing to prgress core strength with no inrcease in LBP. Pt  continuing to report cervical pain. PT educated pt to contact PCP about concerns of continuing neck pain. Pt educated that goals need to be reassessed next session. Plan to reassess goals and either D/c pt or formulate new POC going forward next session based off of pt's progress.    Personal Factors and Comorbidities Comorbidity 1    Comorbidities Obesity    Examination-Activity Limitations Lift;Squat    Examination-Participation Restrictions Cleaning;Occupation    Stability/Clinical Decision Making Stable/Uncomplicated    Rehab Potential Good    PT Frequency 2x / week    PT Duration 6 weeks    PT Treatment/Interventions Passive range of motion;Manual techniques;Therapeutic exercise;Therapeutic activities;Neuromuscular re-education;Joint Manipulations;Spinal Manipulations;Patient/family education;Balance training;Moist Heat;Iontophoresis 41m/ml Dexamethasone;Cryotherapy;Dry needling    PT Next Visit Plan strengthening    PT Home Exercise Plan see education section    Consulted and Agree with Plan of Care Patient           Patient will benefit from skilled therapeutic intervention in order to improve the following deficits and impairments:  Decreased coordination,Pain,Increased muscle spasms,Decreased endurance,Decreased range of motion,Decreased strength,Hypomobility,Obesity  Visit Diagnosis: Chronic midline low back pain without sciatica     Problem List Patient Active Problem List   Diagnosis Date Noted  . BMI 40.0-44.9, adult ([place] 05/22/2018  . Moderate recurrent major depression ([place] 02/08/2017  . Migraine aura, persistent 10/10/2012  . GENITAL HERPES 10/23/2008  . COMMON MIGRAINE 10/23/2008  . Essential hypertension 10/23/2008  . GERD 10/23/2008    MSalem Caster Fairly IV, PT, DPT Physical Therapist- [facility] 06/07/2020, 5:08 PM  [facility] [address] [place] [address]Phone: [phone]  Fax:  [phone] Name: SERIYANA SWEETENMRN: [id number] of Birth: [birth year]

## 2020-06-09 ENCOUNTER — Ambulatory Visit: Payer: No Typology Code available for payment source

## 2020-06-09 ENCOUNTER — Other Ambulatory Visit: Payer: Self-pay

## 2020-06-09 DIAGNOSIS — M546 Pain in thoracic spine: Secondary | ICD-10-CM

## 2020-06-09 DIAGNOSIS — M545 Low back pain, unspecified: Secondary | ICD-10-CM

## 2020-06-09 NOTE — Therapy (Signed)
[facility] [address], [place], [address] Phone: [phone]   Fax:  [phone]  Physical Therapy Treatment/Re-CertificationDischarge Summary Period of Care: 03/15/20 - 06/09/20  Patient Details  Name: Lacey Rodgers MRN: [id number] Date of Birth: June 04, 1980 Referring Provider (PT): Copland MD   Encounter Date: 06/09/2020   PT End of Session - 06/09/20 1606    Visit Number 12    Number of Visits 17    Date for PT Re-Evaluation 06/07/20    Authorization Type [employer]    Authorization Time Period 03/16/20-06/07/20    PT Start Time 1603    PT Stop Time 1645    PT Time Calculation (min) 42 min    Equipment Utilized During Treatment --    Activity Tolerance Patient tolerated treatment well;No increased pain    Behavior During Therapy WFL for tasks assessed/performed           Past Medical History:  Diagnosis Date  . Depression   . Genital herpes   . GERD (gastroesophageal reflux disease)   . Hypertension   . Migraine   . Obesity affecting pregnancy     Past Surgical History:  Procedure Laterality Date  . CESAREAN SECTION N/A 12/16/2015   Procedure: CESAREAN SECTION;  Surgeon: Will Bonnet, MD;  Location: [facility];  Service: Obstetrics;  Laterality: N/A;  . CESAREAN SECTION WITH BILATERAL TUBAL LIGATION  12/02/2018   Procedure: CESAREAN SECTION WITH BILATERAL TUBAL LIGATION;  Surgeon: Homero Fellers, MD;  Location: [facility];  Service: Obstetrics;;  TOB 1850 WEIGHT 7lb 5oz LENGTH 19.75 APGAR 9/9  . DILATION AND CURETTAGE OF UTERUS  01/2013    There were no vitals filed for this visit.   Subjective Assessment - 06/09/20 1604    Subjective Pt reports 1/10 pain NPS in low back. Pt agreeable to D/c from therapy today.    Pertinent History PMH: C-Section: 2017, 2020; HTN;    Currently in Pain? Yes    Pain Score 1     Pain Location Back    Pain Orientation Lower    Pain  Descriptors / Indicators Aching;Dull    Pain Type Chronic pain    Pain Onset More than a month ago    Pain Frequency Intermittent           There.ex: 1x10/exercise  Cat/cow with UE's on DB's: 1x10. Good form/technique after initial demo.  Pelvic tilts Ant/post   Supine LTR's  Bird dog's: 1x5/UE LE. VC's for neutral hip alignment. Good carryover after cuing.   Hip hikes on 4" step: UE support.    Pt educated on frequency, sets and reps for all exercises. Pt required minor cuing for form/technique and was independent with exercises after cuing.     PT Education - 06/09/20 1605    Education Details d/c from therapy. HEP.    Person(s) Educated Patient    Methods Explanation;Demonstration;Tactile cues;Verbal cues;Handout    Comprehension Verbalized understanding;Returned demonstration            PT Short Term Goals - 05/26/20 1612      PT SHORT TERM GOAL #1   Title Patient will be independent HEP to continue benefits of therapy after DC    Baseline dependent with HEP; 5/25: Compliant 5 days/week.    Time 6    Period Weeks    Status Achieved    Target Date 05/26/20  PT Long Term Goals - 06/09/20 1606      PT LONG TERM GOAL #1   Title Patient will have a significant improvement with FOTO to indicate decreased pain along the Lumbar spine    Baseline At eval: 49/61; 5/25: 67/61    Time 6    Period Weeks    Status Achieved      PT LONG TERM GOAL #2   Title Patient will have a worst pain score of 3/10 to indicate signficant improvement lumbar pain on the VAS.    Baseline 6/10; 05/26/20: 4/10 VAS; 06/09/20: 5/10 NPS    Time 6    Period Weeks    Status Partially Met    Target Date 06/07/20      PT LONG TERM GOAL #3   Title Patient will be able to stand for >1 hour to indicate signficant improvement in lumbar and thoracic function.    Baseline 5 minutes pain at 6/10; 5/25: Can stand > 1 hour pain at 1-2/10 NPS; 6/8: Can stand > 1 hour with up to 3/10 NPS.     Time 6    Period Weeks    Status Partially Met    Target Date 06/09/20                 Plan - 06/09/20 1659    Clinical Impression Statement Pt and PT in agreement to D/c from therapy for her POC for LBP. Pt has met short term goal with HEP, FOTO goal, and partially met last two long term goals. Although only partially met, pt has made progress with those goals. Remainder of session with updating pt HEP and educating pt on reps, sets, frequency of exercises to maintain mobility, modulate pain, and improve strength. Pt given updated handout and PT answered all questions. Pt comfortable with D/c from therapy at this time. PT educated pt on process for new referral for current cervical pain. Due to end of cert period and good progress towards short and long term goals pt is D/c'd from therapy.    Personal Factors and Comorbidities Comorbidity 1    Comorbidities Obesity    Examination-Activity Limitations Lift;Squat    Examination-Participation Restrictions Cleaning;Occupation    Stability/Clinical Decision Making Stable/Uncomplicated    Rehab Potential Good    PT Frequency 2x / week    PT Duration 6 weeks    PT Treatment/Interventions Passive range of motion;Manual techniques;Therapeutic exercise;Therapeutic activities;Neuromuscular re-education;Joint Manipulations;Spinal Manipulations;Patient/family education;Balance training;Moist Heat;Iontophoresis 66m/ml Dexamethasone;Cryotherapy;Dry needling    PT Next Visit Plan strengthening    PT Home Exercise Plan see education section    Consulted and Agree with Plan of Care Patient           Patient will benefit from skilled therapeutic intervention in order to improve the following deficits and impairments:  Decreased coordination,Pain,Increased muscle spasms,Decreased endurance,Decreased range of motion,Decreased strength,Hypomobility,Obesity  Visit Diagnosis: Chronic midline low back pain without sciatica - Plan: PT plan of care  cert/re-cert  Pain in thoracic spine - Plan: PT plan of care cert/re-cert     Problem List Patient Active Problem List   Diagnosis Date Noted  . BMI 40.0-44.9, adult ([facility] 05/22/2018  . Moderate recurrent major depression ([facility] 02/08/2017  . Migraine aura, persistent 10/10/2012  . GENITAL HERPES 10/23/2008  . COMMON MIGRAINE 10/23/2008  . Essential hypertension 10/23/2008  . GERD 10/23/2008    MSalem Caster Fairly IV, PT, DPT Physical Therapist- [facility] 06/09/2020, 8:51 PM  [facility]  [place] [facility] [place] [address] [place], [address] Phone: [phone]   Fax:  [phone]  Name: Lacey Rodgers MRN: [id number] Date of Birth: [birth year]

## 2020-06-14 ENCOUNTER — Ambulatory Visit: Payer: No Typology Code available for payment source

## 2020-06-15 ENCOUNTER — Other Ambulatory Visit: Payer: Self-pay

## 2020-06-15 ENCOUNTER — Telehealth: Payer: Self-pay | Admitting: Family Medicine

## 2020-06-15 DIAGNOSIS — F419 Anxiety disorder, unspecified: Secondary | ICD-10-CM

## 2020-06-15 MED ORDER — LABETALOL HCL 100 MG PO TABS
ORAL_TABLET | Freq: Two times a day (BID) | ORAL | 0 refills | Status: DC
  Filled 2020-06-15: qty 180, 90d supply, fill #0

## 2020-06-15 MED FILL — Escitalopram Oxalate Tab 20 MG (Base Equiv): ORAL | 30 days supply | Qty: 30 | Fill #2 | Status: AC

## 2020-06-15 NOTE — Telephone Encounter (Signed)
Please schedule CPE with fasting labs prior with Dr. Copland.  

## 2020-06-16 ENCOUNTER — Other Ambulatory Visit: Payer: Self-pay

## 2020-06-16 MED ORDER — BUPROPION HCL ER (SR) 150 MG PO TB12
150.0000 mg | ORAL_TABLET | Freq: Two times a day (BID) | ORAL | 3 refills | Status: DC
  Filled 2020-06-16: qty 60, 30d supply, fill #0
  Filled 2020-09-02: qty 60, 30d supply, fill #1
  Filled 2020-10-12: qty 60, 30d supply, fill #2
  Filled 2020-12-17: qty 60, 30d supply, fill #3

## 2020-06-17 NOTE — Telephone Encounter (Signed)
Spoke with patient scheduled CPE with fasting labs 

## 2020-06-24 ENCOUNTER — Other Ambulatory Visit: Payer: Self-pay

## 2020-06-24 ENCOUNTER — Ambulatory Visit (INDEPENDENT_AMBULATORY_CARE_PROVIDER_SITE_OTHER): Payer: No Typology Code available for payment source | Admitting: Neurology

## 2020-06-24 ENCOUNTER — Encounter: Payer: Self-pay | Admitting: Neurology

## 2020-06-24 VITALS — BP 145/85 | HR 80 | Ht 59.0 in | Wt 236.0 lb

## 2020-06-24 DIAGNOSIS — R202 Paresthesia of skin: Secondary | ICD-10-CM | POA: Diagnosis not present

## 2020-06-24 DIAGNOSIS — R200 Anesthesia of skin: Secondary | ICD-10-CM | POA: Insufficient documentation

## 2020-06-24 MED ORDER — GABAPENTIN 100 MG PO CAPS
ORAL_CAPSULE | ORAL | 1 refills | Status: DC
  Filled 2020-06-24: qty 150, 30d supply, fill #0

## 2020-06-24 NOTE — Progress Notes (Signed)
[facility]    Provider:  Dr Lucia Gaskins Requesting Provider: [name], MD Primary Care Provider:  [name], MD  CC:  Hand numbness  HPI:  Lacey Rodgers is a 40 y.o. female here as requested by [name], MD for bilateral hand numbness, suspicion for ulnar neuropathy on right and carpal tunnel on the left. PMHx migraine, HTN, depression, obesity.  I reviewed Dr. Cyndie Chime notes: Patient has been following for neck and back pain, also continues to have numbness and tingling in both upper extremities, she is going to formal PT for more than 6 weeks, both her neck and back are feeling quite a bit better, she has been diligent with home rehab as well and she is feeling more optimistic but she continues to have some ongoing right upper extremity tingling from the elbow down and she is having some left-sided hand and wrist numbness and tingling.  She wears a splint at night and has tried to rearrange her arms to alleviate symptoms overnight, she does not place her elbow on any kind of rest when she is driving to decrease symptoms, she does feel like she has had some decreased motor control particularly on the right side.  Symptoms started 7-8 months ago with neck pain, worsening, she has been to >6 weeks of PT and doing exercises at home, still worsening, neck tightness, pain on neck movement with radicular symptoms. She has numbness on the right radiates from the elbow maybe the upper arm to digits 2-5 mostly. If she rests with the right arm with elbows on the table she has a lot of radiating pain and also radiates from the neck to the fingers. She can't pick up the boys, weakness right hand, she has to use the left  hand to help the right hand, can be worse with positions like driving, on the left she has left wrist pain and radiation into all the fingers unclear if digit 5 is involved. Worse in the morning, can throb and be moderately painful all day,   Reviewed  notes, labs and imaging from outside physicians, which showed:  XR cervical spine: FINDINGS: There is no evidence of cervical spine fracture or prevertebral soft tissue swelling. Alignment is normal. No other significant bone abnormalities are identified.   IMPRESSION: Negative cervical spine radiographs.  Reviewed images above and agree  Hgba1c 5.5, cbc slight anemia, bmp unremarkable  Review of Systems: Patient complains of symptoms per HPI as well as the following symptoms headache. Pertinent negatives and positives per HPI. All others negative.   Social History   Socioeconomic History   Marital status: Married    Spouse name: Not on file   Number of children: 1   Years of education: Not on file   Highest education level: Not on file  Occupational History   Not on file  Tobacco Use   Smoking status: Never   Smokeless tobacco: Never  Vaping Use   Vaping Use: Never used  Substance and Sexual Activity   Alcohol use: Yes    Alcohol/week: 0.0 standard drinks    Comment: occ   Drug use: No   Sexual activity: Yes    Birth control/protection: Surgical    Comment: Tubal   Other Topics Concern   Not on file  Social History Narrative   Not on file   Social Determinants of Health   Financial Resource Strain: Not on file  Food Insecurity: Not on file  Transportation Needs: Not on file  Physical  Activity: Not on file  Stress: Not on file  Social Connections: Not on file  Intimate Partner Violence: Not on file    Family History  Problem Relation Age of Onset   Hypertension Mother    Hypertension Father    Hypertension Brother    Cancer Brother 30   Hypertension Maternal Grandmother    Hypertension Maternal Grandfather    Hypertension Paternal Grandmother    Hypertension Paternal Grandfather     Past Medical History:  Diagnosis Date   Depression    Genital herpes    GERD (gastroesophageal reflux disease)    Hypertension    Migraine    Obesity affecting  pregnancy     Patient Active Problem List   Diagnosis Date Noted   Numbness and tingling in both hands 06/24/2020   BMI 40.0-44.9, adult (HCC) 05/22/2018   Moderate recurrent major depression (HCC) 02/08/2017   Migraine aura, persistent 10/10/2012   GENITAL HERPES 10/23/2008   COMMON MIGRAINE 10/23/2008   Essential hypertension 10/23/2008   GERD 10/23/2008    Past Surgical History:  Procedure Laterality Date   CESAREAN SECTION N/A 12/16/2015   Procedure: CESAREAN SECTION;  Surgeon: Conard Novak, MD;  Location: [facility] ORS;  Service: Obstetrics;  Laterality: N/A;   CESAREAN SECTION WITH BILATERAL TUBAL LIGATION  12/02/2018   Procedure: CESAREAN SECTION WITH BILATERAL TUBAL LIGATION;  Surgeon: Natale Milch, MD;  Location: [facility] ORS;  Service: Obstetrics;;  TOB 1850 WEIGHT 7lb 5oz LENGTH 19.75 APGAR 9/9   DILATION AND CURETTAGE OF UTERUS  01/2013    Current Outpatient Medications  Medication Sig Dispense Refill   buPROPion (WELLBUTRIN SR) 150 MG 12 hr tablet Take 1 tablet (150 mg total) by mouth 2 (two) times daily. 60 tablet 3   escitalopram (LEXAPRO) 20 MG tablet TAKE 1 TABLET BY MOUTH DAILY. 30 tablet 6   gabapentin (NEURONTIN) 100 MG capsule Take 1 capsule (100 mg) by mouth three times daily as needed. May take 3 capsule (300 mg) at bedtime. 150 capsule 1   labetalol (NORMODYNE) 100 MG tablet TAKE 1 TABLET BY MOUTH 2 TIMES DAILY. 180 tablet 0   Multiple Vitamin (MULTIVITAMIN) tablet Take 1 tablet by mouth daily.     omeprazole (PRILOSEC) 20 MG capsule Take 1 capsule (20 mg total) by mouth daily as needed. (Patient taking differently: Take 40 mg by mouth daily as needed.)     valACYclovir (VALTREX) 500 MG tablet Take 1 tablet (500 mg total) by mouth 2 (two) times daily as needed. 30 tablet 5   No current facility-administered medications for this visit.    Allergies as of 06/24/2020 - Review Complete 06/24/2020  Allergen Reaction Noted    Pseudoeph-doxylamine-dm-apap Hives 10/23/2008    Vitals: BP (!) 145/85   Pulse 80   Ht 4\' 11"  (1.499 m)   Wt 236 lb (107 kg)   BMI 47.67 kg/m  Last Weight:  Wt Readings from Last 1 Encounters:  06/24/20 236 lb (107 kg)   Last Height:   Ht Readings from Last 1 Encounters:  06/24/20 4\' 11"  (1.499 m)     Physical exam: Exam: Gen: NAD, conversant, well nourised, obese, well groomed                     CV: RRR, no MRG. No Carotid Bruits. No peripheral edema, warm, nontender Eyes: Conjunctivae clear without exudates or hemorrhage  Neuro: Detailed Neurologic Exam  Speech:    Speech is normal; fluent and spontaneous with  normal comprehension.  Cognition:    The patient is oriented to person, place, and time;     recent and remote memory intact;     language fluent;     normal attention, concentration,     fund of knowledge Cranial Nerves:    The pupils are equal, round, and reactive to light. The fundi are flat. Visual fields are full to finger confrontation. Extraocular movements are intact. Trigeminal sensation is intact and the muscles of mastication are normal. The face is symmetric. The palate elevates in the midline. Hearing intact. Voice is normal. Shoulder shrug is normal. The tongue has normal motion without fasciculations.   Coordination:    Normal  Gait:    normal.   Motor Observation:    No asymmetry, no atrophy, and no involuntary movements noted. Tone:    Normal muscle tone.    Posture:    Posture is normal. normal erect    Strength:    Strength is V/V in the upper and lower limbs.      Sensation: intact to LT     Reflex Exam:  DTR's:    Deep tendon reflexes in the upper and lower extremities are normal bilaterally.   Toes:    The toes are downgoing bilaterally.   Clonus:    Clonus is absent.   + tinels at the right elbows, right wrist, left wrist. +mcphalen's ,an's maneuver.   Assessment/Plan:  Patient with numbness and tingling both hands,  appears to have Ulnar neuropathy on the right and CTS on the left but may also have some CTS on the right as well. Less likely cervical radic but can consider MRI cervical spine. If we find peripheral entrapment, [facility].   EMG/NCS: bilateral uppers  Orders Placed This Encounter  Procedures   NCV with EMG(electromyography)   Meds ordered this encounter  Medications   gabapentin (NEURONTIN) 100 MG capsule    Sig: Take 1 capsule (100 mg) by mouth three times daily as needed. May take 3 capsule (300 mg) at bedtime.    Dispense:  150 capsule    Refill:  1    Cc: Copland, Karleen Hampshire, MD,  [name], MD  Naomie Dean, MD  [facility] [address] [place], [place] [address]  Phone [phone] Fax [phone]

## 2020-06-24 NOTE — Patient Instructions (Signed)
 Electromyoneurogram Electromyoneurogram is a test to check how well your muscles and nerves are working. This procedure includes the combined use of electromyogram (EMG) and nerve conduction study (NCS). EMG is used to look for muscular disorders. NCS, which is also called electroneurogram, measures how well your nerves are controlling your muscles. The procedures are usually done together to check if your muscles and nerves are healthy. If the results of the tests are abnormal, this may indicate disease or injury, such as a neuromuscular disease or peripheral nerve damage. Tell a health care provider about: Any allergies you have. All medicines you are taking, including vitamins, herbs, eye drops, creams, and over-the-counter medicines. Any problems you or family members have had with anesthetic medicines. Any blood disorders you have. Any surgeries you have had. Any medical conditions you have. If you have a pacemaker. Whether you are pregnant or may be pregnant. What are the risks? Generally, this is a safe procedure. However, problems may occur, including: Infection where the electrodes were inserted. Bleeding. What happens before the procedure? Medicines Ask your health care provider about: Changing or stopping your regular medicines. This is especially important if you are taking diabetes medicines or blood thinners. Taking medicines such as aspirin and ibuprofen. These medicines can thin your blood. Do not take these medicines unless your health care provider tells you to take them. Taking over-the-counter medicines, vitamins, herbs, and supplements. General instructions Your health care provider may ask you to avoid: Beverages that have caffeine, such as coffee and tea. Any products that contain nicotine or tobacco. These products include cigarettes, e-cigarettes, and chewing tobacco. If you need help quitting, ask your health care provider. Do not use lotions or creams on the  same day that you will be having the procedure. What happens during the procedure? For EMG  Your health care provider will ask you to stay in a position so that he or she can access the muscle that will be studied. You may be standing, sitting, or lying down. You may be given a medicine that numbs the area (local anesthetic). A very thin needle that has an electrode will be inserted into your muscle. Another small electrode will be placed on your skin near the muscle. Your health care provider will ask you to continue to remain still. The electrodes will send a signal that tells about the electrical activity of your muscles. You may see this on a monitor or hear it in the room. After your muscles have been studied at rest, your health care provider will ask you to contract or flex your muscles. The electrodes will send a signal that tells about the electrical activity of your muscles. Your health care provider will remove the electrodes and the electrode needles when the procedure is finished. The procedure may vary among health care providers and hospitals. For NCS  An electrode that records your nerve activity (recording electrode) will be placed on your skin by the muscle that is being studied. An electrode that is used as a reference (reference electrode) will be placed near the recording electrode. A paste or gel will be applied to your skin between the recording electrode and the reference electrode. Your nerve will be stimulated with a mild shock. Your health care provider will measure how much time it takes for your muscle to react. Your health care provider will remove the electrodes and the gel when the procedure is finished. The procedure may vary among health care providers and hospitals. What happens   after the procedure? It is up to you to get the results of your procedure. Ask your health care provider, or the department that is doing the procedure, when your results will be  ready. Your health care provider may: Give you medicines for any pain. Monitor the insertion sites to make sure that bleeding stops. Summary Electromyoneurogram is a test to check how well your muscles and nerves are working. If the results of the tests are abnormal, this may indicate disease or injury. This is a safe procedure. However, problems may occur, such as bleeding and infection. Your health care provider will do two tests to complete this procedure. One checks your muscles (EMG) and another checks your nerves (NCS). It is up to you to get the results of your procedure. Ask your health care provider, or the department that is doing the procedure, when your results will be ready. This information is not intended to replace advice given to you by your health care provider. Make sure you discuss any questions you have with your health care provider. Document Revised: 09/04/2017 Document Reviewed: 08/17/2017 Elsevier Patient Education  2022 Elsevier Inc.  

## 2020-06-28 ENCOUNTER — Other Ambulatory Visit: Payer: Self-pay

## 2020-06-28 ENCOUNTER — Ambulatory Visit (INDEPENDENT_AMBULATORY_CARE_PROVIDER_SITE_OTHER): Payer: No Typology Code available for payment source | Admitting: Neurology

## 2020-06-28 DIAGNOSIS — R29898 Other symptoms and signs involving the musculoskeletal system: Secondary | ICD-10-CM

## 2020-06-28 DIAGNOSIS — R202 Paresthesia of skin: Secondary | ICD-10-CM

## 2020-06-28 DIAGNOSIS — M79601 Pain in right arm: Secondary | ICD-10-CM

## 2020-06-28 DIAGNOSIS — R200 Anesthesia of skin: Secondary | ICD-10-CM

## 2020-06-28 DIAGNOSIS — Z0289 Encounter for other administrative examinations: Secondary | ICD-10-CM

## 2020-06-28 DIAGNOSIS — M5412 Radiculopathy, cervical region: Secondary | ICD-10-CM

## 2020-06-28 DIAGNOSIS — G5601 Carpal tunnel syndrome, right upper limb: Secondary | ICD-10-CM

## 2020-06-28 NOTE — Progress Notes (Signed)
Full Name: Lacey Rodgers Gender: Female MRN #: [id number] Date of Birth: October 12, 1980    Visit Date: 06/28/2020 15:39 Age: 40 Years Examining Physician: Naomie Dean, MD  Requesting Provider: [name], MD Primary Care Provider:  [name], MD    History: Numbness and tingling in both hands, right >> left  Summary: Nerve Conduction Studies were performed on the bilateral upper extremities.  The right median/ulnar (palm) comparison nerve showed prolonged distal peak latency (Median Palm, 2.3 ms, N<2.2) and abnormal peak latency difference (Median Palm-Ulnar Palm, 0.6 ms, N<0.4) with a relative median delay.  All remaining nerves (as indicated in the following tables) were within normal limits.  All muscles (as indicated in the following tables) were within normal limits.     Conclusion: There is electrophysiologic evidence of early/mild right Carpal Tunnel Syndrome. No suggestion of polyneuropathy or radiculopathy. Findings do not correlate with patient's bilateral symptoms, recommend MRI cervical spine.  Orders Placed This Encounter  Procedures   MR CERVICAL SPINE WO CONTRAST     Naomie Dean M.D.  [facility] [address], [address] [place], [place] [address] Tel: [phone] Fax: [phone]  Verbal informed consent was obtained from the patient, patient was informed of potential risk of procedure, including bruising, bleeding, hematoma formation, infection, muscle weakness, muscle pain, numbness, among others.        MNC    Nerve / Sites Muscle Latency Ref. Amplitude Ref. Rel Amp Segments Distance Velocity Ref. Area    ms ms mV mV %  cm m/s m/s mVms  R Median - APB     Wrist APB 3.9 ?4.4 7.7 ?4.0 100 Wrist - APB 7   21.7     Upper arm APB 7.6  6.9  89.7 Upper arm - Wrist 19 52 ?49 19.4  L Median - APB     Wrist APB 3.3 ?4.4 7.0 ?4.0 100 Wrist - APB 7   23.4     Upper arm APB 6.6  7.0  99.3 Upper arm - Wrist 18 54 ?49 21.5  R  Ulnar - ADM     Wrist ADM 2.7 ?3.3 14.5 ?6.0 100 Wrist - ADM 7   30.0     B.Elbow ADM 5.1  12.6  87 B.Elbow - Wrist 17 70 ?49 28.2     A.Elbow ADM 6.5  12.4  98.2 A.Elbow - B.Elbow 10 70 ?49 28.1  R Ulnar - FDI     Wrist FDI 2.8 ?4.5 17.5 ?7.0 100 Wrist - FDI 8   32.6     B.Elbow FDI 5.6  16.8  95.7 B.Elbow - Wrist 18 64 ?49 33.5     A.Elbow FDI 7.2  16.5  98.5 A.Elbow - B.Elbow 10 64 ?49 33.5         A.Elbow - Wrist                 SNC    Nerve / Sites Rec. Site Peak Lat Ref.  Amp Ref. Segments Distance Peak Diff Ref.    ms ms V V  cm ms ms  R Median, Ulnar - Transcarpal comparison     Median Palm Wrist 2.3 ?2.2 88 ?35 Median Palm - Wrist 8       Ulnar Palm Wrist 1.7 ?2.2 27 ?12 Ulnar Palm - Wrist 8          Median Palm - Ulnar Palm  0.6 ?0.4  L Median, Ulnar - Transcarpal comparison  Median Palm Wrist 2.0 ?2.2 84 ?35 Median Palm - Wrist 8       Ulnar Palm Wrist 1.8 ?2.2 21 ?12 Ulnar Palm - Wrist 8          Median Palm - Ulnar Palm  0.2 ?0.4  R Median - Orthodromic (Dig II, Mid palm)     Dig II Wrist 3.2 ?3.4 19 ?10 Dig II - Wrist 13    L Median - Orthodromic (Dig II, Mid palm)     Dig II Wrist 2.7 ?3.4 19 ?10 Dig II - Wrist 13    R Ulnar - Orthodromic, (Dig V, Mid palm)     Dig V Wrist 2.4 ?3.1 13 ?5 Dig V - Wrist 54                 F  Wave    Nerve F Lat Ref.   ms ms  R Ulnar - ADM 23.0 ?32.0       EMG Summary Table    Spontaneous MUAP Recruitment  Muscle IA Fib PSW Fasc Other Amp Dur. Poly Pattern  R. Deltoid Normal None None None _______ Normal Normal Normal Normal  R. Triceps brachii Normal None None None _______ Normal Normal Normal Normal  R. Pronator teres Normal None None None _______ Normal Normal Normal Normal  R. First dorsal interosseous Normal None None None _______ Normal Normal Normal Normal  R. Opponens pollicis Normal None None None _______ Normal Normal Normal Normal  R. Cervical paraspinals (low) Normal None None None _______ Normal Normal Normal  Normal

## 2020-06-29 ENCOUNTER — Other Ambulatory Visit: Payer: Self-pay | Admitting: Family Medicine

## 2020-06-29 DIAGNOSIS — Z131 Encounter for screening for diabetes mellitus: Secondary | ICD-10-CM

## 2020-06-29 DIAGNOSIS — Z79899 Other long term (current) drug therapy: Secondary | ICD-10-CM

## 2020-06-29 DIAGNOSIS — Z13220 Encounter for screening for lipoid disorders: Secondary | ICD-10-CM

## 2020-06-29 NOTE — Progress Notes (Signed)
See procedure note.

## 2020-06-30 NOTE — Procedures (Signed)
Full Name: Lacey Rodgers Gender: Female MRN #: [id number] Date of Birth: 08/01/1980    Visit Date: 06/28/2020 15:39 Age: 40 Years Examining Physician: Naomie Dean, MD  Requesting Provider: [name], MD Primary Care Provider:  [name], MD    History: Numbness and tingling in both hands, right >> left  Summary: Nerve Conduction Studies were performed on the bilateral upper extremities.  The right median/ulnar (palm) comparison nerve showed prolonged distal peak latency (Median Palm, 2.3 ms, N<2.2) and abnormal peak latency difference (Median Palm-Ulnar Palm, 0.6 ms, N<0.4) with a relative median delay.  All remaining nerves (as indicated in the following tables) were within normal limits.  All muscles (as indicated in the following tables) were within normal limits.     Conclusion: There is electrophysiologic evidence of early/mild right Carpal Tunnel Syndrome. No suggestion of polyneuropathy or radiculopathy. Findings do not correlate with patient's bilateral symptoms, recommend MRI cervical spine.  Naomie Dean M.D.  [facility] [address], [address] [place], [place] [address] Tel: [phone] Fax: [phone]  Verbal informed consent was obtained from the patient, patient was informed of potential risk of procedure, including bruising, bleeding, hematoma formation, infection, muscle weakness, muscle pain, numbness, among others.        MNC    Nerve / Sites Muscle Latency Ref. Amplitude Ref. Rel Amp Segments Distance Velocity Ref. Area    ms ms mV mV %  cm m/s m/s mVms  R Median - APB     Wrist APB 3.9 ?4.4 7.7 ?4.0 100 Wrist - APB 7   21.7     Upper arm APB 7.6  6.9  89.7 Upper arm - Wrist 19 52 ?49 19.4  L Median - APB     Wrist APB 3.3 ?4.4 7.0 ?4.0 100 Wrist - APB 7   23.4     Upper arm APB 6.6  7.0  99.3 Upper arm - Wrist 18 54 ?49 21.5  R Ulnar - ADM     Wrist ADM 2.7 ?3.3 14.5 ?6.0 100 Wrist - ADM 7   30.0     B.Elbow  ADM 5.1  12.6  87 B.Elbow - Wrist 17 70 ?49 28.2     A.Elbow ADM 6.5  12.4  98.2 A.Elbow - B.Elbow 10 70 ?49 28.1  R Ulnar - FDI     Wrist FDI 2.8 ?4.5 17.5 ?7.0 100 Wrist - FDI 8   32.6     B.Elbow FDI 5.6  16.8  95.7 B.Elbow - Wrist 18 64 ?49 33.5     A.Elbow FDI 7.2  16.5  98.5 A.Elbow - B.Elbow 10 64 ?49 33.5         A.Elbow - Wrist                 SNC    Nerve / Sites Rec. Site Peak Lat Ref.  Amp Ref. Segments Distance Peak Diff Ref.    ms ms V V  cm ms ms  R Median, Ulnar - Transcarpal comparison     Median Palm Wrist 2.3 ?2.2 88 ?35 Median Palm - Wrist 8       Ulnar Palm Wrist 1.7 ?2.2 27 ?12 Ulnar Palm - Wrist 8          Median Palm - Ulnar Palm  0.6 ?0.4  L Median, Ulnar - Transcarpal comparison     Median Palm Wrist 2.0 ?2.2 84 ?35 Median Palm - Wrist 8  Ulnar Palm Wrist 1.8 ?2.2 21 ?12 Ulnar Palm - Wrist 8          Median Palm - Ulnar Palm  0.2 ?0.4  R Median - Orthodromic (Dig II, Mid palm)     Dig II Wrist 3.2 ?3.4 19 ?10 Dig II - Wrist 13    L Median - Orthodromic (Dig II, Mid palm)     Dig II Wrist 2.7 ?3.4 19 ?10 Dig II - Wrist 13    R Ulnar - Orthodromic, (Dig V, Mid palm)     Dig V Wrist 2.4 ?3.1 13 ?5 Dig V - Wrist 33                 F  Wave    Nerve F Lat Ref.   ms ms  R Ulnar - ADM 23.0 ?32.0       EMG Summary Table    Spontaneous MUAP Recruitment  Muscle IA Fib PSW Fasc Other Amp Dur. Poly Pattern  R. Deltoid Normal None None None _______ Normal Normal Normal Normal  R. Triceps brachii Normal None None None _______ Normal Normal Normal Normal  R. Pronator teres Normal None None None _______ Normal Normal Normal Normal  R. First dorsal interosseous Normal None None None _______ Normal Normal Normal Normal  R. Opponens pollicis Normal None None None _______ Normal Normal Normal Normal  R. Cervical paraspinals (low) Normal None None None _______ Normal Normal Normal Normal

## 2020-07-09 ENCOUNTER — Other Ambulatory Visit: Payer: Self-pay

## 2020-07-09 MED FILL — Escitalopram Oxalate Tab 20 MG (Base Equiv): ORAL | 30 days supply | Qty: 30 | Fill #3 | Status: AC

## 2020-07-12 NOTE — Telephone Encounter (Signed)
I called [facility] @ [phone] and spoke with Greig Castilla regarding patient's MRI. He created a case (#[id number]) and transferred me to [facility] to give clinical info. She was able to approve the request. PA #[id number] (07/12/20- 08/11/20). I called the patient and got her scheduled for tomorrow at 2:30.

## 2020-07-13 ENCOUNTER — Ambulatory Visit: Payer: No Typology Code available for payment source

## 2020-07-13 DIAGNOSIS — R200 Anesthesia of skin: Secondary | ICD-10-CM

## 2020-07-13 DIAGNOSIS — M5412 Radiculopathy, cervical region: Secondary | ICD-10-CM

## 2020-07-13 DIAGNOSIS — R29898 Other symptoms and signs involving the musculoskeletal system: Secondary | ICD-10-CM | POA: Diagnosis not present

## 2020-07-13 DIAGNOSIS — R202 Paresthesia of skin: Secondary | ICD-10-CM

## 2020-07-13 DIAGNOSIS — M79602 Pain in left arm: Secondary | ICD-10-CM

## 2020-07-13 DIAGNOSIS — M79601 Pain in right arm: Secondary | ICD-10-CM

## 2020-07-14 ENCOUNTER — Other Ambulatory Visit: Payer: Self-pay

## 2020-07-14 ENCOUNTER — Other Ambulatory Visit (INDEPENDENT_AMBULATORY_CARE_PROVIDER_SITE_OTHER): Payer: No Typology Code available for payment source

## 2020-07-14 DIAGNOSIS — Z13220 Encounter for screening for lipoid disorders: Secondary | ICD-10-CM

## 2020-07-14 DIAGNOSIS — Z79899 Other long term (current) drug therapy: Secondary | ICD-10-CM

## 2020-07-14 DIAGNOSIS — Z131 Encounter for screening for diabetes mellitus: Secondary | ICD-10-CM | POA: Diagnosis not present

## 2020-07-15 ENCOUNTER — Telehealth: Payer: Self-pay

## 2020-07-15 LAB — CBC WITH DIFFERENTIAL/PLATELET
Basophils Absolute: 0.1 10*3/uL (ref 0.0–0.1)
Basophils Relative: 1.4 % (ref 0.0–3.0)
Eosinophils Absolute: 0.1 10*3/uL (ref 0.0–0.7)
Eosinophils Relative: 1.9 % (ref 0.0–5.0)
HCT: 40.1 % (ref 36.0–46.0)
Hemoglobin: 13.7 g/dL (ref 12.0–15.0)
Lymphocytes Relative: 29.5 % (ref 12.0–46.0)
Lymphs Abs: 1.9 10*3/uL (ref 0.7–4.0)
MCHC: 34.1 g/dL (ref 30.0–36.0)
MCV: 86.8 fl (ref 78.0–100.0)
Monocytes Absolute: 0.5 10*3/uL (ref 0.1–1.0)
Monocytes Relative: 7.4 % (ref 3.0–12.0)
Neutro Abs: 3.7 10*3/uL (ref 1.4–7.7)
Neutrophils Relative %: 59.8 % (ref 43.0–77.0)
Platelets: 264 10*3/uL (ref 150.0–400.0)
RBC: 4.62 Mil/uL (ref 3.87–5.11)
RDW: 13 % (ref 11.5–15.5)
WBC: 6.3 10*3/uL (ref 4.0–10.5)

## 2020-07-15 LAB — LIPID PANEL
Cholesterol: 291 mg/dL — ABNORMAL HIGH (ref 0–200)
HDL: 65.8 mg/dL (ref >39.00)
NonHDL: 225.56
Total CHOL/HDL Ratio: 4
Triglycerides: 216 mg/dL — ABNORMAL HIGH (ref 0.0–149.0)
VLDL: 43.2 mg/dL — ABNORMAL HIGH (ref 0.0–40.0)

## 2020-07-15 LAB — BASIC METABOLIC PANEL
BUN: 17 mg/dL (ref 6–23)
CO2: 28 mEq/L (ref 19–32)
Calcium: 9.3 mg/dL (ref 8.4–10.5)
Chloride: 103 mEq/L (ref 96–112)
Creatinine, Ser: 0.98 mg/dL (ref 0.40–1.20)
GFR: 72.75 mL/min (ref >60.00)
Glucose, Bld: 80 mg/dL (ref 70–99)
Potassium: 4.4 mEq/L (ref 3.5–5.1)
Sodium: 139 mEq/L (ref 135–145)

## 2020-07-15 LAB — LDL CHOLESTEROL, DIRECT: Direct LDL: 195 mg/dL

## 2020-07-15 LAB — HEPATIC FUNCTION PANEL
ALT: 12 U/L (ref 0–35)
AST: 14 U/L (ref 0–37)
Albumin: 4.3 g/dL (ref 3.5–5.2)
Alkaline Phosphatase: 45 U/L (ref 39–117)
Bilirubin, Direct: 0.1 mg/dL (ref 0.0–0.3)
Total Bilirubin: 0.3 mg/dL (ref 0.2–1.2)
Total Protein: 6.6 g/dL (ref 6.0–8.3)

## 2020-07-15 LAB — TSH: TSH: 1.12 u[IU]/mL (ref 0.35–5.50)

## 2020-07-15 LAB — HEMOGLOBIN A1C: Hgb A1c MFr Bld: 5.8 % (ref 4.6–6.5)

## 2020-07-15 NOTE — Telephone Encounter (Signed)
-----   Message from Anson Fret, MD sent at 07/15/2020 12:36 PM EDT ----- MRI cervical spine normal for age, looks great, have a wonderful weekend. Dr. Lucia Gaskins

## 2020-07-15 NOTE — Telephone Encounter (Signed)
I called the pt and advised of results. Pt verbalized understanding and had no other questions/concerns.  

## 2020-07-19 ENCOUNTER — Other Ambulatory Visit: Payer: Self-pay

## 2020-07-19 ENCOUNTER — Ambulatory Visit: Payer: No Typology Code available for payment source | Admitting: Family Medicine

## 2020-07-19 ENCOUNTER — Ambulatory Visit (INDEPENDENT_AMBULATORY_CARE_PROVIDER_SITE_OTHER): Payer: No Typology Code available for payment source | Admitting: Family Medicine

## 2020-07-19 ENCOUNTER — Encounter: Payer: Self-pay | Admitting: Family Medicine

## 2020-07-19 VITALS — BP 124/68 | HR 85 | Temp 97.5°F | Ht 59.0 in | Wt 238.0 lb

## 2020-07-19 DIAGNOSIS — R202 Paresthesia of skin: Secondary | ICD-10-CM

## 2020-07-19 DIAGNOSIS — R200 Anesthesia of skin: Secondary | ICD-10-CM

## 2020-07-19 DIAGNOSIS — Z0000 Encounter for general adult medical examination without abnormal findings: Secondary | ICD-10-CM | POA: Diagnosis not present

## 2020-07-19 DIAGNOSIS — E782 Mixed hyperlipidemia: Secondary | ICD-10-CM | POA: Diagnosis not present

## 2020-07-19 MED ORDER — GABAPENTIN 100 MG PO CAPS
ORAL_CAPSULE | ORAL | 3 refills | Status: AC
Start: 2020-07-19 — End: ?
  Filled 2020-07-19: qty 270, 30d supply, fill #0
  Filled 2021-01-31: qty 270, 30d supply, fill #1

## 2020-07-19 MED ORDER — OMEPRAZOLE 40 MG PO CPDR
40.0000 mg | DELAYED_RELEASE_CAPSULE | Freq: Every day | ORAL | 3 refills | Status: DC
  Filled 2020-07-19: qty 90, 90d supply, fill #0
  Filled 2020-10-12: qty 90, 90d supply, fill #1
  Filled 2021-01-06 – 2021-01-31 (×2): qty 90, 90d supply, fill #2
  Filled 2021-04-10 – 2021-04-11 (×2): qty 90, 90d supply, fill #3

## 2020-07-19 NOTE — Progress Notes (Signed)
Antigone Crowell T. Alexande Sheerin, MD, [place] [facility] [place], [address]  Phone: [phone]  FAX: [phone]  Lacey Rodgers - 40 y.o. female  MRN [id number]  Date of Birth: May 02, 1980  Date: 07/19/2020  PCP: Owens Loffler, MD  Referral: Owens Loffler, MD  Chief Complaint  Patient presents with   Annual Exam    This visit occurred during the SARS-CoV-2 public health emergency.  Safety protocols were in place, including screening questions prior to the visit, additional usage of staff PPE, and extensive cleaning of exam room while observing appropriate contact time as indicated for disinfecting solutions.   Patient Care Team: Owens Loffler, MD as PCP - General Subjective:   Lacey Rodgers is a 40 y.o. pleasant patient who presents with the following:  Health Maintenance Summary Reviewed and updated, unless pt declines services.  Tobacco History Reviewed. Non-smoker Alcohol: 3-4 a week.  Exercise Habits: Some activity, rec at least 30 mins 5 times a week STD concerns: none Drug Use: None Lumps or breast concerns: no  Has had a BTL. PT did do ok.   Within a month, woke up and had a bad crick in her neck.  She continues to have pain going down in her right greater than left arm. Has been seeing Dr. Jaynee Eagles. Neck has been bothering  NCV and MRI of neck are normal.  ? MRI brain  - Neurontin 200 mg QHS.  Covid vaccine booster? Will hold for now.   Mom dx breast cancer, 35, MGM in early 2's. - OB has gotten a mammo Had some dense breast only.  MGM, ? Age. Late 50's.  89 now.   Family with inflammatory bowel disease.  Has been eating worse.  Family / niece.  Wt Readings from Last 3 Encounters:  07/19/20 238 lb (108 kg)  06/24/20 236 lb (107 kg)  04/12/20 228 lb 8 oz (103.6 kg)     Health Maintenance  Topic Date Due   Pneumococcal Vaccine 26-12 Years old (1 - PCV) Never done    COVID-19 Vaccine (3 - Moderna risk series) 05/29/2019   INFLUENZA VACCINE  08/02/2020   PAP SMEAR-Modifier  03/03/2022   TETANUS/TDAP  10/14/2028   Hepatitis C Screening  Completed   HIV Screening  Completed   HPV VACCINES  Aged Out    Immunization History  Administered Date(s) Administered   Influenza,inj,Quad PF,6+ Mos 11/12/2013, 10/16/2017, 10/10/2019   MMR 12/19/2015   Moderna Sars-Covid-2 Vaccination 04/03/2019, 05/01/2019   Tdap 10/28/2015, 10/15/2018   Patient Active Problem List   Diagnosis Date Noted   Numbness and tingling in both hands 06/24/2020   BMI 40.0-44.9, adult ([place]) 05/22/2018   Moderate recurrent major depression ([place]) 02/08/2017   Migraine aura, persistent 10/10/2012   GENITAL HERPES 10/23/2008   COMMON MIGRAINE 10/23/2008   Essential hypertension 10/23/2008   GERD 10/23/2008    Past Medical History:  Diagnosis Date   Depression    Genital herpes    GERD (gastroesophageal reflux disease)    Hypertension    Migraine    Obesity affecting pregnancy     Past Surgical History:  Procedure Laterality Date   CESAREAN SECTION N/A 12/16/2015   Procedure: CESAREAN SECTION;  Surgeon: Will Bonnet, MD;  Location: [facility] ORS;  Service: Obstetrics;  Laterality: N/A;   CESAREAN SECTION WITH BILATERAL TUBAL LIGATION  12/02/2018   Procedure: CESAREAN SECTION WITH BILATERAL TUBAL LIGATION;  Surgeon: Homero Fellers, MD;  Location: [facility] ORS;  Service: Obstetrics;;  TOB 1850 WEIGHT 7lb 5oz LENGTH 19.75 APGAR 9/9   DILATION AND CURETTAGE OF UTERUS  01/2013    Family History  Problem Relation Age of Onset   Hypertension Mother    Hypertension Father    Hypertension Brother    Cancer Brother 28   Hypertension Maternal Grandmother    Hypertension Maternal Grandfather    Hypertension Paternal Grandmother    Hypertension Paternal Grandfather     Past Medical History, Surgical History, Social History, Family History, Problem List, Medications, and  Allergies have been reviewed and updated if relevant.  Review of Systems: Pertinent positives are listed above.  Otherwise, a full 14 point review of systems has been done in full and it is negative except where it is noted positive.  Objective:   BP 124/68   Pulse 85   Temp (!) 97.5 F (36.4 C) (Temporal)   Ht _0  (1.499 m)   Wt 238 lb (108 kg)   SpO2 97%   BMI 48.07 kg/m  Ideal Body Weight: Weight in (lb) to have BMI = 25: 123.5 No results found. Depression screen [facility] 2/9 07/23/2019 03/08/2017 02/08/2017  Decreased Interest _1 Down, Depressed, Hopeless _2 PHQ - 2 Score _3 Altered sleeping _4 Tired, decreased energy _5 Change in appetite _6 Feeling bad or failure about yourself  _7 Trouble concentrating _8 Moving slowly or fidgety/restless 0 0 2  Suicidal thoughts 0 0 0  PHQ-9 Score _9 Difficult doing work/chores Somewhat difficult Somewhat difficult Very difficult     GEN: well developed, well nourished, no acute distress Eyes: conjunctiva and lids normal, PERRLA, EOMI ENT: TM clear, nares clear, oral exam WNL Neck: supple, no lymphadenopathy, no thyromegaly, no JVD Pulm: clear to auscultation and percussion, respiratory effort normal CV: regular rate and rhythm, S1-S2, no murmur, rub or gallop, no bruits Chest: no scars, masses, no lumps BREAST: breast exam declined GI: soft, non-tender; no hepatosplenomegaly, masses; active bowel sounds all quadrants GU: GU exam declined Lymph: no cervical, axillary or inguinal adenopathy MSK: gait normal, muscle tone and strength WNL, no joint swelling, effusions, discoloration, crepitus  SKIN: clear, good turgor, color WNL, no rashes, lesions, or ulcerations Neuro: normal mental status, normal strength, sensation, and motion Psych: alert; oriented to person, place and time, normally interactive and not anxious or depressed in appearance.   All labs reviewed with patient. Results for orders  placed or performed in visit on 07/14/20  TSH  Result Value Ref Range   TSH 1.12 0.35 - 5.50 uIU/mL  Basic metabolic panel  Result Value Ref Range   Sodium 139 135 - 145 mEq/L   Potassium 4.4 3.5 - 5.1 mEq/L   Chloride 103 96 - 112 mEq/L   CO2 28 19 - 32 mEq/L   Glucose, Bld 80 70 - 99 mg/dL   BUN 17 6 - 23 mg/dL   Creatinine, Ser 0.98 0.40 - 1.20 mg/dL   GFR 72.75 >60.00 mL/min   Calcium 9.3 8.4 - 10.5 mg/dL  Hepatic function panel  Result Value Ref Range   Total Bilirubin 0.3 0.2 - 1.2 mg/dL   Bilirubin, Direct 0.1 0.0 - 0.3 mg/dL   Alkaline Phosphatase 45 39 - 117 U/L   AST 14 0 - 37 U/L   ALT 12 0 - 35 U/L  Total Protein 6.6 6.0 - 8.3 g/dL   Albumin 4.3 3.5 - 5.2 g/dL  CBC with Differential/Platelet  Result Value Ref Range   WBC 6.3 4.0 - 10.5 K/uL   RBC 4.62 3.87 - 5.11 Mil/uL   Hemoglobin 13.7 12.0 - 15.0 g/dL   HCT 40.1 36.0 - 46.0 %   MCV 86.8 78.0 - 100.0 fl   MCHC 34.1 30.0 - 36.0 g/dL   RDW 13.0 11.5 - 15.5 %   Platelets 264.0 150.0 - 400.0 K/uL   Neutrophils Relative % 59.8 43.0 - 77.0 %   Lymphocytes Relative 29.5 12.0 - 46.0 %   Monocytes Relative 7.4 3.0 - 12.0 %   Eosinophils Relative 1.9 0.0 - 5.0 %   Basophils Relative 1.4 0.0 - 3.0 %   Neutro Abs 3.7 1.4 - 7.7 K/uL   Lymphs Abs 1.9 0.7 - 4.0 K/uL   Monocytes Absolute 0.5 0.1 - 1.0 K/uL   Eosinophils Absolute 0.1 0.0 - 0.7 K/uL   Basophils Absolute 0.1 0.0 - 0.1 K/uL  Hemoglobin A1c  Result Value Ref Range   Hgb A1c MFr Bld 5.8 4.6 - 6.5 %  Lipid panel  Result Value Ref Range   Cholesterol 291 (H) 0 - 200 mg/dL   Triglycerides 216.0 (H) 0.0 - 149.0 mg/dL   HDL 65.80 >39.00 mg/dL   VLDL 43.2 (H) 0.0 - 40.0 mg/dL   Total CHOL/HDL Ratio 4    NonHDL 225.56   LDL cholesterol, direct  Result Value Ref Range   Direct LDL 195.0 mg/dL   MR CERVICAL SPINE WO CONTRAST  Result Date: 07/15/2020 [facility] NEUROIMAGING REPORT STUDY DATE: 07/13/20 PATIENT NAME: MYA SUELL DOB:  08/07/1980 MRN: [id number] ORDERING CLINICIAN: Melvenia Beam, MD CLINICAL HISTORY: 40 year old with numbness and tingling. EXAM: MR CERVICAL SPINE WO CONTRAST TECHNIQUE: MRI of the cervical spine was obtained utilizing 3 mm sagittal slices from the posterior fossa down to the T3-4 level with T1, T2 and inversion recovery views. In addition 4 mm axial slices from K0-2 down to T1-2 level were included with T2 and gradient echo views. CONTRAST: none COMPARISON: none IMAGING SITE: [facility] [address] (1.5 Tesla MRI) FINDINGS: On sagittal views the vertebral bodies have normal height and alignment.  Mild straightening of normal cervical curvature.  Minimal disc bulging noted from C2-3 down to C6-7 levels.  The spinal cord is normal in size and appearance. The posterior fossa, pituitary gland and paraspinal soft tissues are unremarkable.  On axial views there is no spinal stenosis or foraminal narrowing. Limited views of the soft tissues of the head and neck are unremarkable.   Unremarkable MRI cervical spine without contrast.  No spinal stenosis or foraminal narrowing. INTERPRETING PHYSICIAN: Penni Bombard, MD Certified in Neurology, Neurophysiology and Neuroimaging [facility] [address] [place], [place] [address] [phone]   NCV with EMG(electromyography)  Result Date: 06/28/2020 Melvenia Beam, MD     06/30/2020  5:32 PM   Full Name: Lacey Rodgers Gender: Female MRN #: [id number] Date of Birth: 18-Dec-1980   Visit Date: 06/28/2020 15:39 Age: 79 Years Examining Physician: Sarina Ill, MD Requesting Provider: Owens Loffler, MD Primary Care Provider:  Owens Loffler, MD   History: Numbness and tingling in both hands, right >> left Summary: Nerve Conduction Studies were performed on the bilateral upper extremities. The right median/ulnar (palm) comparison nerve showed prolonged distal peak latency (Median Palm, 2.3 ms, N<2.2) and abnormal peak  latency difference (  Median Palm-Ulnar Palm, 0.6 ms, N<0.4) with a relative median delay.  All remaining nerves (as indicated in the following tables) were within normal limits.  All muscles (as indicated in the following tables) were within normal limits.   Conclusion: There is electrophysiologic evidence of early/mild right Carpal Tunnel Syndrome. No suggestion of polyneuropathy or radiculopathy. Findings do not correlate with patient's bilateral symptoms, recommend MRI cervical spine. Sarina Ill M.D. [facility] [address] [place], [place] [address] Tel: [phone] Fax: [phone] Verbal informed consent was obtained from the patient, patient was informed of potential risk of procedure, including bruising, bleeding, hematoma formation, infection, muscle weakness, muscle pain, numbness, among others.     [place]   Nerve / Sites Muscle Latency Ref. Amplitude Ref. Rel Amp Segments Distance Velocity Ref. Area   ms ms mV mV %  cm m/s m/s mVms R Median - APB    Wrist APB 3.9 ?4.4 7.7 ?4.0 100 Wrist - APB 7   21.7    Upper arm APB 7.6  6.9  89.7 Upper arm - Wrist 19 52 ?49 19.4 L Median - APB    Wrist APB 3.3 ?4.4 7.0 ?4.0 100 Wrist - APB 7   23.4    Upper arm APB 6.6  7.0  99.3 Upper arm - Wrist 18 54 ?49 21.5 R Ulnar - ADM    Wrist ADM 2.7 ?3.3 14.5 ?6.0 100 Wrist - ADM 7   30.0    B.Elbow ADM 5.1  12.6  87 B.Elbow - Wrist 17 70 ?49 28.2    A.Elbow ADM 6.5  12.4  98.2 A.Elbow - B.Elbow 10 70 ?49 28.1 R Ulnar - FDI    Wrist FDI 2.8 ?4.5 17.5 ?7.0 100 Wrist - FDI 8   32.6    B.Elbow FDI 5.6  16.8  95.7 B.Elbow - Wrist 18 64 ?49 33.5    A.Elbow FDI 7.2  16.5  98.5 A.Elbow - B.Elbow 10 64 ?49 33.5        A.Elbow - Wrist               SNC   Nerve / Sites Rec. Site Peak Lat Ref.  Amp Ref. Segments Distance Peak Diff Ref.   ms ms V V  cm ms ms R Median, Ulnar - Transcarpal comparison    Median Palm Wrist 2.3 ?2.2 88 ?35 Median Palm - Wrist 8      Ulnar Palm Wrist 1.7 ?2.2 27 ?12 Ulnar Palm -  Wrist 8         Median Palm - Ulnar Palm  0.6 ?0.4 L Median, Ulnar - Transcarpal comparison    Median Palm Wrist 2.0 ?2.2 84 ?35 Median Palm - Wrist 8      Ulnar Palm Wrist 1.8 ?2.2 21 ?12 Ulnar Palm - Wrist 8         Median Palm - Ulnar Palm  0.2 ?0.4 R Median - Orthodromic (Dig II, Mid palm)    Dig II Wrist 3.2 ?3.4 19 ?10 Dig II - Wrist 13   L Median - Orthodromic (Dig II, Mid palm)    Dig II Wrist 2.7 ?3.4 19 ?10 Dig II - Wrist 13   R Ulnar - Orthodromic, (Dig V, Mid palm)    Dig V Wrist 2.4 ?3.1 13 ?5 Dig V - Wrist 40               F  Wave   Nerve F Lat Ref.  ms  ms R Ulnar - ADM 23.0 ?32.0     EMG Summary Table   Spontaneous MUAP Recruitment Muscle IA Fib PSW Fasc Other Amp Dur. Poly Pattern R. Deltoid Normal None None None _______ Normal Normal Normal Normal R. Triceps brachii Normal None None None _______ Normal Normal Normal Normal R. Pronator teres Normal None None None _______ Normal Normal Normal Normal R. First dorsal interosseous Normal None None None _______ Normal Normal Normal Normal R. Opponens pollicis Normal None None None _______ Normal Normal Normal Normal R. Cervical paraspinals (low) Normal None None None _______ Normal Normal Normal Normal     Assessment and Plan:     ICD-10-CM   1. Healthcare maintenance  Z00.00     2. Mixed hyperlipidemia  E78.2 Lipid panel    3. Numbness and tingling in both hands  R20.0 gabapentin (NEURONTIN) 100 MG capsule   R20.2      Health maintenance: Recommended COVID boosters Hyperlipidemia, total cholesterol up to 300 in a setting of very poor diet recently.  Recheck 6 months.  She continues to have significant right and left upper extremity pain not explained by cervical spine MRI or nerve conduction test.  I am going to treat her clinically and titrate up her gabapentin dosing.  Patient Instructions  Generic Gabapentin Titration Schedule  Generic Gabapentin (generic form of Neurontin) comes in 300 mg tablets or capsules.   You have to  titrate your dose slowly to reduce side effects and reduce sedation / sleepiness.    Week               Breakfast  Lunch   Dinner One                 0   0   200 mg Two   112m   0   2027mThree   10017m 100m51m200mg110mr   200mg 11m0mg  61mmg   65m   200mg (2 27m) 200mg   2046m(2 ta15mSix   200mg (2 tab45m00mg   300mg56mtabs)49men   300mg (2 tabs) 60mg (2 tabs) 363m (3 tabs) Ei88m  300mg (3 tabs) 3005m2 tabs) 300m53m tabs)  If y24mave any problems at any time, drop back to the previous dosing schedule. Continue with this dose for 1 week, and then try to go up the next step again.     Health Maintenance Exam: The patient's preventative maintenance and recommended screening tests for an annual wellness exam were reviewed in full today. Brought up to date unless services declined.  Counselled on the importance of diet, exercise, and its role in overall health and mortality. The patient's FH and SH was reviewed, including their home life, tobacco status, and drug and alcohol status.  Follow-up in 1 year for physical exam or additional follow-up below.  Follow-up: Return in about 3 months (around 10/19/2020) for lab recheck of cholesterol. Or follow-up in 1 year if not noted.  Future Appointments  Date Time Provider Department [facility]  [facility]    Meds ordered this encounter  Medications   omeprazole (PRILOSEC) 40 MG capsule    Sig: Take 1 capsule (40 mg total) by mouth daily.    Dispense:  90 capsule    Refill:  3   gabapentin (NEURONTIN) 100 MG capsule    Sig: Follow titration until 3 capsules three times a day is  reached.    Dispense:  270 capsule    Refill:  3   Medications Discontinued During This Encounter  Medication Reason   omeprazole (PRILOSEC) 20 MG capsule    gabapentin (NEURONTIN) 100 MG capsule    Orders Placed This Encounter  Procedures   Lipid panel    Signed,  Teshawn Moan T. Dayannara Pascal, MD   Allergies  as of 07/19/2020       Reactions   Pseudoeph-doxylamine-dm-apap Hives        Medication List        Accurate as of July 19, 2020 11:59 PM. If you have any questions, ask your nurse or doctor.          buPROPion 150 MG 12 hr tablet Commonly known as: Wellbutrin SR Take 1 tablet (150 mg total) by mouth 2 (two) times daily.   escitalopram 20 MG tablet Commonly known as: LEXAPRO TAKE 1 TABLET BY MOUTH DAILY.   gabapentin 100 MG capsule Commonly known as: Neurontin Follow titration until 3 capsules three times a day is reached. What changed: additional instructions Changed by: Owens Loffler, MD   labetalol 100 MG tablet Commonly known as: NORMODYNE TAKE 1 TABLET BY MOUTH 2 TIMES DAILY.   multivitamin tablet Take 1 tablet by mouth daily.   omeprazole 40 MG capsule Commonly known as: PRILOSEC Take 1 capsule (40 mg total) by mouth daily. What changed:  medication strength how much to take when to take this reasons to take this Changed by: Owens Loffler, MD   valACYclovir 500 MG tablet Commonly known as: VALTREX Take 1 tablet (500 mg total) by mouth 2 (two) times daily as needed.

## 2020-07-19 NOTE — Patient Instructions (Signed)
Generic Gabapentin Titration Schedule  Generic Gabapentin (generic form of Neurontin) comes in 300 mg tablets or capsules.   You have to titrate your dose slowly to reduce side effects and reduce sedation / sleepiness.    Week               Breakfast  Lunch   Dinner One                 0   0   200 mg Two   100mg    0   200mg  Three   100mg    100mg    200mg  Four   200mg    100mg    200mg    Five   200mg  (2 tabs) 200mg    200mg  (2 tabs) Six   200mg  (2 tabs) 200mg    300mg  (2 tabs) Seven   300mg  (2 tabs) 200mg  (2 tabs) 300mg  (3 tabs) Eight   300mg  (3 tabs) 300mg  (2 tabs) 300mg  (3 tabs)  If you have any problems at any time, drop back to the previous dosing schedule. Continue with this dose for 1 week, and then try to go up the next step again.

## 2020-09-02 ENCOUNTER — Other Ambulatory Visit: Payer: Self-pay

## 2020-09-02 MED FILL — Escitalopram Oxalate Tab 20 MG (Base Equiv): ORAL | 30 days supply | Qty: 30 | Fill #4 | Status: AC

## 2020-10-05 ENCOUNTER — Other Ambulatory Visit: Payer: Self-pay

## 2020-10-05 ENCOUNTER — Ambulatory Visit
Admission: EM | Admit: 2020-10-05 | Discharge: 2020-10-05 | Disposition: A | Discharge status: Home / Self Care | Payer: No Typology Code available for payment source | Attending: Emergency Medicine | Admitting: Emergency Medicine

## 2020-10-05 ENCOUNTER — Encounter: Payer: Self-pay | Admitting: Emergency Medicine

## 2020-10-05 DIAGNOSIS — Z1152 Encounter for screening for COVID-19: Secondary | ICD-10-CM | POA: Diagnosis not present

## 2020-10-05 DIAGNOSIS — J0100 Acute maxillary sinusitis, unspecified: Secondary | ICD-10-CM

## 2020-10-05 DIAGNOSIS — H6693 Otitis media, unspecified, bilateral: Secondary | ICD-10-CM

## 2020-10-05 MED ORDER — AMOXICILLIN 875 MG PO TABS
875.0000 mg | ORAL_TABLET | Freq: Two times a day (BID) | ORAL | 0 refills | Status: AC
  Filled 2020-10-05: qty 14, 7d supply, fill #0

## 2020-10-05 NOTE — ED Provider Notes (Signed)
Lacey Rodgers    CSN: [id number] Arrival date & time: 10/05/20  4097      History   Chief Complaint Chief Complaint  Patient presents with   Cough   Nasal Congestion   Otalgia    HPI Lacey Rodgers is a 40 y.o. female.  Patient presents with 5-day history of bilateral ear pain, sore throat, cough.  She denies fever, rash, shortness of breath, or other symptoms.  OTC treatment attempted at home.  Her medical history includes hypertension, migraine headaches, GERD, morbid obesity, depression.  The history is provided by the patient and medical records.   Past Medical History:  Diagnosis Date   Depression    Genital herpes    GERD (gastroesophageal reflux disease)    Hypertension    Migraine    Obesity affecting pregnancy     Patient Active Problem List   Diagnosis Date Noted   Numbness and tingling in both hands 06/24/2020   BMI 40.0-44.9, adult (HCC) 05/22/2018   Moderate recurrent major depression (HCC) 02/08/2017   Migraine aura, persistent 10/10/2012   GENITAL HERPES 10/23/2008   COMMON MIGRAINE 10/23/2008   Essential hypertension 10/23/2008   GERD 10/23/2008    Past Surgical History:  Procedure Laterality Date   CESAREAN SECTION N/A 12/16/2015   Procedure: CESAREAN SECTION;  Surgeon: Conard Novak, MD;  Location: [facility] ORS;  Service: Obstetrics;  Laterality: N/A;   CESAREAN SECTION WITH BILATERAL TUBAL LIGATION  12/02/2018   Procedure: CESAREAN SECTION WITH BILATERAL TUBAL LIGATION;  Surgeon: Natale Milch, MD;  Location: [facility] ORS;  Service: Obstetrics;;  TOB 1850 WEIGHT 7lb 5oz LENGTH 19.75 APGAR 9/9   DILATION AND CURETTAGE OF UTERUS  01/2013    OB History     Gravida  4   Para  2   Term  2   Preterm  0   AB  2   Living  2      SAB      IAB      Ectopic      Multiple  0   Live Births  2            Home Medications    Prior to Admission medications   Medication Sig Start Date End Date Taking?  Authorizing Provider  amoxicillin (AMOXIL) 875 MG tablet Take 1 tablet (875 mg total) by mouth 2 (two) times daily for 7 days. 10/05/20 10/12/20 Yes Mickie Bail, NP  buPROPion (WELLBUTRIN SR) 150 MG 12 hr tablet Take 1 tablet (150 mg total) by mouth 2 (two) times daily. 06/16/20   Copland, Karleen Hampshire, MD  escitalopram (LEXAPRO) 20 MG tablet TAKE 1 TABLET BY MOUTH DAILY. 03/01/20 03/01/21  Natale Milch, MD  gabapentin (NEURONTIN) 100 MG capsule Follow titration until 3 capsules three times a day is reached. 07/19/20   Copland, Karleen Hampshire, MD  labetalol (NORMODYNE) 100 MG tablet TAKE 1 TABLET BY MOUTH 2 TIMES DAILY. 06/15/20 06/15/21  Copland, Karleen Hampshire, MD  Multiple Vitamin (MULTIVITAMIN) tablet Take 1 tablet by mouth daily.    [provider]  omeprazole (PRILOSEC) 40 MG capsule Take 1 capsule (40 mg total) by mouth daily. 07/19/20   Copland, Karleen Hampshire, MD  valACYclovir (VALTREX) 500 MG tablet Take 1 tablet (500 mg total) by mouth 2 (two) times daily as needed. 12/04/18   Farrel Conners, CNM    Family History Family History  Problem Relation Age of Onset   Hypertension Mother    Hypertension Father  Hypertension Brother    Cancer Brother 31   Hypertension Maternal Grandmother    Hypertension Maternal Grandfather    Hypertension Paternal Grandmother    Hypertension Paternal Grandfather     Social History Social History   Tobacco Use   Smoking status: Never   Smokeless tobacco: Never  Vaping Use   Vaping Use: Never used  Substance Use Topics   Alcohol use: Yes    Alcohol/week: 0.0 standard drinks    Comment: occ   Drug use: No     Allergies   Pseudoeph-doxylamine-dm-apap   Review of Systems Review of Systems  Constitutional:  Negative for chills and fever.  HENT:  Positive for congestion, postnasal drip and sore throat. Negative for ear pain.   Respiratory:  Positive for cough. Negative for shortness of breath.   Cardiovascular:  Negative for chest pain and  palpitations.  Gastrointestinal:  Negative for abdominal pain and vomiting.  Skin:  Negative for color change and rash.  All other systems reviewed and are negative.   Physical Exam Triage Vital Signs ED Triage Vitals  Enc Vitals Group     BP      Pulse      Resp      Temp      Temp src      SpO2      Weight      Height      Head Circumference      Peak Flow      Pain Score      Pain Loc      Pain Edu?      Excl. in GC?    No data found.  Updated Vital Signs BP 132/86 (BP Location: Left Arm)   Pulse 90   Temp 99 F (37.2 C) (Oral)   Resp 18   LMP 10/04/2020 (Exact Date)   SpO2 96%   Visual Acuity Right Eye Distance:   Left Eye Distance:   Bilateral Distance:    Right Eye Near:   Left Eye Near:    Bilateral Near:     Physical Exam Vitals and nursing note reviewed.  Constitutional:      General: She is not in acute distress.    Appearance: She is well-developed. She is obese. She is not ill-appearing.  HENT:     Head: Normocephalic and atraumatic.     Right Ear: Tympanic membrane is erythematous.     Left Ear: Tympanic membrane is erythematous.     Nose: Congestion present.     Mouth/Throat:     Mouth: Mucous membranes are moist.     Pharynx: Oropharynx is clear.  Eyes:     Conjunctiva/sclera: Conjunctivae normal.  Cardiovascular:     Rate and Rhythm: Normal rate and regular rhythm.     Heart sounds: Normal heart sounds.  Pulmonary:     Effort: Pulmonary effort is normal. No respiratory distress.     Breath sounds: Normal breath sounds.  Abdominal:     Palpations: Abdomen is soft.     Tenderness: There is no abdominal tenderness.  Musculoskeletal:     Cervical back: Neck supple.  Skin:    General: Skin is warm and dry.  Neurological:     General: No focal deficit present.     Mental Status: She is alert and oriented to person, place, and time.     Gait: Gait normal.  Psychiatric:        Mood and Affect: Mood normal.  Behavior:  Behavior normal.     UC Treatments / Results  Labs (all labs ordered are listed, but only abnormal results are displayed) Labs Reviewed  NOVEL CORONAVIRUS, NAA    EKG   Radiology No results found.  Procedures Procedures (including critical care time)  Medications Ordered in UC Medications - No data to display  Initial Impression / Assessment and Plan / UC Course  I have reviewed the triage vital signs and the nursing notes.  Pertinent labs & imaging results that were available during my care of the patient were reviewed by me and considered in my medical decision making (see chart for details).  Bilateral otitis media, acute sinusitis, COVID test. Treating with amoxicillin.  Discussed other symptomatic treatment including ibuprofen and plain Mucinex.  Instructed patient to follow-up with her PCP if her symptoms are not improving.  Instructed her to self quarantine until the COVID test result is back.  Suggested that she notify health at work.  Patient agrees to plan of care.   Final Clinical Impressions(s) / UC Diagnoses   Final diagnoses:  Encounter for screening for COVID-19  Bilateral otitis media, unspecified otitis media type  Acute non-recurrent maxillary sinusitis     Discharge Instructions      Take the amoxicillin as directed.    Your COVID test is pending.  You should self quarantine until the test result is back.    Take Tylenol or ibuprofen as needed for fever or discomfort.  Rest and keep yourself hydrated.    Follow-up with your primary care provider if your symptoms are not improving.         ED Prescriptions     Medication Sig Dispense Auth. Provider   amoxicillin (AMOXIL) 875 MG tablet Take 1 tablet (875 mg total) by mouth 2 (two) times daily for 7 days. 14 tablet Mickie Bail, NP      PDMP not reviewed this encounter.   Mickie Bail, NP 10/05/20 [phone]

## 2020-10-05 NOTE — Discharge Instructions (Addendum)
Take the amoxicillin as directed.    Your COVID test is pending.  You should self quarantine until the test result is back.    Take Tylenol or ibuprofen as needed for fever or discomfort.  Rest and keep yourself hydrated.    Follow-up with your primary care provider if your symptoms are not improving.     

## 2020-10-05 NOTE — ED Triage Notes (Signed)
Pt here with cough, congestion and ear fullness x 5 days.

## 2020-10-06 DIAGNOSIS — Z79899 Other long term (current) drug therapy: Secondary | ICD-10-CM

## 2020-10-06 DIAGNOSIS — E782 Mixed hyperlipidemia: Secondary | ICD-10-CM

## 2020-10-06 LAB — SARS-COV-2, NAA 2 DAY TAT

## 2020-10-06 LAB — NOVEL CORONAVIRUS, NAA: SARS-CoV-2, NAA: NOT DETECTED

## 2020-10-08 NOTE — Telephone Encounter (Signed)
Dr Patsy Lager, I spoke with [facility] lab. They said to order labs and choose for resulting agency [facility] agency-if you do not see this option or have trouble with it I can do that for you. Just order the labs you need and pend them and forward this mychart message to me.Thank you

## 2020-10-11 NOTE — Telephone Encounter (Signed)
No appointment is needed. I have sent mychart message to the patient advising her of this been done.

## 2020-10-11 NOTE — Telephone Encounter (Signed)
I think that I put these in correctly.  Would you mind checking?  That last time that I did this, I did not do it properly.

## 2020-10-11 NOTE — Telephone Encounter (Signed)
I am not sure if we need to make her a  lab appointment there, too?  If so, I would appreciate it if you could help.

## 2020-10-12 ENCOUNTER — Telehealth (INDEPENDENT_AMBULATORY_CARE_PROVIDER_SITE_OTHER): Payer: No Typology Code available for payment source | Admitting: Nurse Practitioner

## 2020-10-12 ENCOUNTER — Other Ambulatory Visit: Payer: Self-pay

## 2020-10-12 VITALS — Temp 97.7°F

## 2020-10-12 DIAGNOSIS — H6690 Otitis media, unspecified, unspecified ear: Secondary | ICD-10-CM | POA: Insufficient documentation

## 2020-10-12 DIAGNOSIS — R0981 Nasal congestion: Secondary | ICD-10-CM | POA: Insufficient documentation

## 2020-10-12 MED ORDER — AMOXICILLIN-POT CLAVULANATE 875-125 MG PO TABS
1.0000 | ORAL_TABLET | Freq: Two times a day (BID) | ORAL | 0 refills | Status: AC
  Filled 2020-10-12: qty 14, 7d supply, fill #0

## 2020-10-12 MED ORDER — FLUTICASONE PROPIONATE 50 MCG/ACT NA SUSP
2.0000 | Freq: Every day | NASAL | 6 refills | Status: DC
  Filled 2020-10-12: qty 16, 30d supply, fill #0

## 2020-10-12 MED FILL — Escitalopram Oxalate Tab 20 MG (Base Equiv): ORAL | 30 days supply | Qty: 30 | Fill #5 | Status: AC

## 2020-10-12 NOTE — Assessment & Plan Note (Signed)
Was diagnosed with in person exam at urgent care.  Patient was started on amoxicillin with no improvement.  We will step up to Augmentin in case is resistant.  Patient will monitor symptoms and let us know if no improvement within 2 to 4 days of antibiotic treatment.  Patient to stop taking amoxicillin prescribed from urgent care.  Continue to monitor.

## 2020-10-12 NOTE — Telephone Encounter (Signed)
Patient saw Lacey Rodgers for virtual visit today 10/12/20

## 2020-10-12 NOTE — Progress Notes (Signed)
Patient ID: Lacey Rodgers, female    DOB: [birth year], 40 y.o.   MRN: [id number]  Virtual visit completed through [employer], a video enabled telemedicine application. Due to national recommendations of social distancing due to COVID-19, a virtual visit is felt to be most appropriate for this patient at this time. Reviewed limitations, risks, security and privacy concerns of performing a virtual visit and the availability of in person appointments. I also reviewed that there may be a patient responsible charge related to this service. The patient agreed to proceed.   Patient location: home Provider location: [place] [facility], office Persons participating in this virtual visit: patient, provider   If any vitals were documented, they were collected by patient at home unless specified below.    Temp 97.7 F (36.5 C)   LMP 10/04/2020 (Exact Date)    CC: Ear infection  Subjective:   HPI: Lacey Rodgers is a 40 y.o. female presenting on 10/12/2020 for Cough (Ears feel muffled, ear pressure, chest congestion. Sx started on 10/01/20, went to urgent  care on 10/05/20, taking Amoxicillin but not doing better.)   Patient symptoms started 10/01/2020 UC on 10/4 dx with bilateral OM and was started on amoxicillin  Day 7 on amoxicillin and no improvement.  Has been taking Mucinex DM    Relevant past medical, surgical, family and social history reviewed and updated as indicated. Interim medical history since our last visit reviewed. Allergies and medications reviewed and updated. Outpatient Medications Prior to Visit  Medication Sig Dispense Refill   amoxicillin (AMOXIL) 875 MG tablet Take 1 tablet (875 mg total) by mouth 2 (two) times daily for 7 days. 14 tablet 0   buPROPion (WELLBUTRIN SR) 150 MG 12 hr tablet Take 1 tablet (150 mg total) by mouth 2 (two) times daily. 60 tablet 3   escitalopram (LEXAPRO) 20 MG tablet TAKE 1 TABLET BY MOUTH DAILY. 30 tablet 6   labetalol (NORMODYNE)  100 MG tablet TAKE 1 TABLET BY MOUTH 2 TIMES DAILY. 180 tablet 0   Multiple Vitamin (MULTIVITAMIN) tablet Take 1 tablet by mouth daily.     omeprazole (PRILOSEC) 40 MG capsule Take 1 capsule (40 mg total) by mouth daily. 90 capsule 3   valACYclovir (VALTREX) 500 MG tablet Take 1 tablet (500 mg total) by mouth 2 (two) times daily as needed. 30 tablet 5   gabapentin (NEURONTIN) 100 MG capsule Follow titration until 3 capsules three times a day is reached. (Patient not taking: Reported on 10/12/2020) 270 capsule 3   No facility-administered medications prior to visit.     Per HPI unless specifically indicated in ROS section below Review of Systems  Constitutional:  Positive for fatigue. Negative for chills and fever.  HENT:  Positive for congestion and ear pain.   Respiratory:  Positive for cough (clear). Negative for shortness of breath.   Cardiovascular:  Negative for chest pain.  Gastrointestinal:  Positive for abdominal pain. Negative for diarrhea, nausea and vomiting.  Genitourinary:  Positive for dysuria. Negative for hematuria.  Objective:  Temp 97.7 F (36.5 C)   LMP 10/04/2020 (Exact Date)   Wt Readings from Last 3 Encounters:  07/19/20 238 lb (108 kg)  06/24/20 236 lb (107 kg)  04/12/20 228 lb 8 oz (103.6 kg)       Physical exam: Gen: alert, NAD, not ill appearing Pulm: speaks in complete sentences without increased work of breathing Psych: normal mood, normal thought content      Results for  orders placed or performed during the hospital encounter of 10/05/20  Novel Coronavirus, NAA ([facility])   Specimen: Nasopharyngeal Swab; Nasopharyngeal(NP) swabs in vial transport medium   Nasopharynge  Patient  Result Value Ref Range   SARS-CoV-2, NAA Not Detected Not Detected  SARS-COV-2, NAA 2 DAY TAT   Nasopharynge  Patient  Result Value Ref Range   SARS-CoV-2, NAA 2 DAY TAT Performed    Assessment & Plan:   Problem List Items Addressed This Visit       Nervous and  Auditory   Acute otitis media - Primary    Was diagnosed with in person exam at urgent care.  Patient was started on amoxicillin with no improvement.  We will step up to Augmentin in case is resistant.  Patient will monitor symptoms and let us know if no improvement within 2 to 4 days of antibiotic treatment.  Patient to stop taking amoxicillin prescribed from urgent care.  Continue to monitor.      Relevant Medications   amoxicillin-clavulanate (AUGMENTIN) 875-125 MG tablet     Other   Nasal congestion    Having some nasal congestion and ear fullness.  We will get patient to start using Flonase 2 sprays each nasal daily.  Continue to monitor      Relevant Medications   fluticasone (FLONASE) 50 MCG/ACT nasal spray     No orders of the defined types were placed in this encounter.  No orders of the defined types were placed in this encounter.   I discussed the assessment and treatment plan with the patient. The patient was provided an opportunity to ask questions and all were answered. The patient agreed with the plan and demonstrated an understanding of the instructions. The patient was advised to call back or seek an in-person evaluation if the symptoms worsen or if the condition fails to improve as anticipated.  Follow up plan: No follow-ups on file.  Audria Nine, NP

## 2020-10-12 NOTE — Assessment & Plan Note (Signed)
Having some nasal congestion and ear fullness.  We will get patient to start using Flonase 2 sprays each nasal daily.  Continue to monitor

## 2020-10-18 ENCOUNTER — Other Ambulatory Visit
Admission: RE | Admit: 2020-10-18 | Discharge: 2020-10-18 | Disposition: A | Discharge status: Home / Self Care | Payer: No Typology Code available for payment source | Source: Ambulatory Visit | Attending: Family Medicine | Admitting: Family Medicine

## 2020-10-18 DIAGNOSIS — E782 Mixed hyperlipidemia: Secondary | ICD-10-CM | POA: Diagnosis present

## 2020-10-18 DIAGNOSIS — Z79899 Other long term (current) drug therapy: Secondary | ICD-10-CM | POA: Diagnosis present

## 2020-10-18 LAB — LIPID PANEL
Cholesterol: 188 mg/dL (ref 0–200)
HDL: 52 mg/dL (ref >40)
LDL Cholesterol: 107 mg/dL — ABNORMAL HIGH (ref 0–99)
Total CHOL/HDL Ratio: 3.6 RATIO
Triglycerides: 147 mg/dL (ref <150)
VLDL: 29 mg/dL (ref 0–40)

## 2020-10-18 LAB — HEPATIC FUNCTION PANEL
ALT: 22 U/L (ref 0–44)
AST: 19 U/L (ref 15–41)
Albumin: 4.1 g/dL (ref 3.5–5.0)
Alkaline Phosphatase: 38 U/L (ref 38–126)
Bilirubin, Direct: <0.1 mg/dL (ref 0.0–0.2)
Total Bilirubin: 0.6 mg/dL (ref 0.3–1.2)
Total Protein: 7 g/dL (ref 6.5–8.1)

## 2020-10-19 ENCOUNTER — Other Ambulatory Visit: Payer: No Typology Code available for payment source

## 2020-11-04 ENCOUNTER — Ambulatory Visit
Admission: EM | Admit: 2020-11-04 | Discharge: 2020-11-04 | Disposition: A | Discharge status: Home / Self Care | Payer: No Typology Code available for payment source

## 2020-11-04 ENCOUNTER — Other Ambulatory Visit: Payer: Self-pay

## 2020-11-04 MED ORDER — PREDNISONE 20 MG PO TABS
ORAL_TABLET | ORAL | 0 refills | Status: DC
  Filled 2020-11-04: qty 15, 10d supply, fill #0

## 2020-11-05 ENCOUNTER — Ambulatory Visit: Payer: No Typology Code available for payment source

## 2020-11-12 ENCOUNTER — Other Ambulatory Visit: Payer: Self-pay | Admitting: Obstetrics and Gynecology

## 2020-11-12 ENCOUNTER — Other Ambulatory Visit: Payer: Self-pay

## 2020-11-12 DIAGNOSIS — F419 Anxiety disorder, unspecified: Secondary | ICD-10-CM

## 2020-11-12 MED ORDER — ESCITALOPRAM OXALATE 20 MG PO TABS
ORAL_TABLET | Freq: Every day | ORAL | 6 refills | Status: DC
  Filled 2020-11-12: qty 30, 30d supply, fill #0
  Filled 2020-12-17: qty 30, 30d supply, fill #1
  Filled 2021-01-31: qty 30, 30d supply, fill #2
  Filled 2021-03-07: qty 30, 30d supply, fill #3
  Filled 2021-04-10: qty 30, 30d supply, fill #4
  Filled 2021-05-17: qty 30, 30d supply, fill #5
  Filled 2021-06-29: qty 30, 30d supply, fill #6

## 2020-12-13 ENCOUNTER — Ambulatory Visit: Payer: No Typology Code available for payment source | Admitting: Family Medicine

## 2020-12-17 ENCOUNTER — Other Ambulatory Visit: Payer: Self-pay

## 2021-01-05 ENCOUNTER — Ambulatory Visit
Admission: EM | Admit: 2021-01-05 | Discharge: 2021-01-05 | Disposition: A | Discharge status: Home / Self Care | Payer: No Typology Code available for payment source | Attending: Emergency Medicine | Admitting: Emergency Medicine

## 2021-01-05 ENCOUNTER — Other Ambulatory Visit: Payer: Self-pay

## 2021-01-05 ENCOUNTER — Encounter: Payer: Self-pay | Admitting: Emergency Medicine

## 2021-01-05 ENCOUNTER — Telehealth: Payer: Self-pay

## 2021-01-05 DIAGNOSIS — M436 Torticollis: Secondary | ICD-10-CM | POA: Diagnosis not present

## 2021-01-05 DIAGNOSIS — R42 Dizziness and giddiness: Secondary | ICD-10-CM | POA: Diagnosis not present

## 2021-01-05 MED ORDER — METHOCARBAMOL 500 MG PO TABS
500.0000 mg | ORAL_TABLET | Freq: Two times a day (BID) | ORAL | 0 refills | Status: DC | PRN
  Filled 2021-01-05: qty 10, 5d supply, fill #0

## 2021-01-05 NOTE — ED Provider Notes (Signed)
Renaldo Fiddler    CSN: [id number] Arrival date & time: 01/05/21  8242      History   Chief Complaint Chief Complaint  Patient presents with   Neck Pain   Dizziness    HPI Lacey Rodgers is a 41 y.o. female.  Patient presents with right neck muscle pain x4 days.  The neck pain is worse with ROM of neck and improves with rest.  She also reports dizziness and left ear pain x 2-3 days.  No falls or injury.  She describes the dizziness as feeling off balance; no sensation of room spinning.  Treatment at home with ibuprofen; last taken yesterday.  She denies headache, fever, ear drainage, sore throat, cough, shortness of breath, chest pain, focal weakness, or other symptoms.  Patient's medical history includes migraine headaches, hypertension, depression, obesity. LMP: 3 weeks ago.  She denies current pregnancy or breastfeeding.     The history is provided by the patient and medical records.   Past Medical History:  Diagnosis Date   Depression    Genital herpes    GERD (gastroesophageal reflux disease)    Hypertension    Migraine    Obesity affecting pregnancy     Patient Active Problem List   Diagnosis Date Noted   Nasal congestion 10/12/2020   Acute otitis media 10/12/2020   Numbness and tingling in both hands 06/24/2020   BMI 40.0-44.9, adult (HCC) 05/22/2018   Moderate recurrent major depression (HCC) 02/08/2017   Migraine aura, persistent 10/10/2012   GENITAL HERPES 10/23/2008   COMMON MIGRAINE 10/23/2008   Essential hypertension 10/23/2008   GERD 10/23/2008    Past Surgical History:  Procedure Laterality Date   CESAREAN SECTION N/A 12/16/2015   Procedure: CESAREAN SECTION;  Surgeon: Conard Novak, MD;  Location: [facility] ORS;  Service: Obstetrics;  Laterality: N/A;   CESAREAN SECTION WITH BILATERAL TUBAL LIGATION  12/02/2018   Procedure: CESAREAN SECTION WITH BILATERAL TUBAL LIGATION;  Surgeon: Natale Milch, MD;  Location: [facility] ORS;  Service:  Obstetrics;;  TOB 1850 WEIGHT 7lb 5oz LENGTH 19.75 APGAR 9/9   DILATION AND CURETTAGE OF UTERUS  01/2013    OB History     Gravida  4   Para  2   Term  2   Preterm  0   AB  2   Living  2      SAB      IAB      Ectopic      Multiple  0   Live Births  2            Home Medications    Prior to Admission medications   Medication Sig Start Date End Date Taking? Authorizing Provider  buPROPion (WELLBUTRIN SR) 150 MG 12 hr tablet Take 1 tablet (150 mg total) by mouth 2 (two) times daily. 06/16/20  Yes Copland, Karleen Hampshire, MD  escitalopram (LEXAPRO) 20 MG tablet TAKE 1 TABLET BY MOUTH DAILY. 11/12/20 11/12/21 Yes Schuman, Christanna R, MD  labetalol (NORMODYNE) 100 MG tablet TAKE 1 TABLET BY MOUTH 2 TIMES DAILY. 06/15/20 06/15/21 Yes Copland, Karleen Hampshire, MD  methocarbamol (ROBAXIN) 500 MG tablet Take 1 tablet (500 mg total) by mouth 2 (two) times daily as needed for muscle spasms. 01/05/21  Yes Mickie Bail, NP  Multiple Vitamin (MULTIVITAMIN) tablet Take 1 tablet by mouth daily.   Yes [provider]  omeprazole (PRILOSEC) 40 MG capsule Take 1 capsule (40 mg total) by mouth daily. 07/19/20  Yes Copland,  Karleen Hampshire, MD  fluticasone (FLONASE) 50 MCG/ACT nasal spray Place 2 sprays into both nostrils daily. 10/12/20   Eden Emms, NP  gabapentin (NEURONTIN) 100 MG capsule Follow titration until 3 capsules three times a day is reached. Patient not taking: Reported on 10/12/2020 07/19/20   Copland, Karleen Hampshire, MD  predniSONE (DELTASONE) 20 MG tablet 2 tabs po daily for 5 days, then 1 tab po daily for 5 days 11/04/20   Copland, Karleen Hampshire, MD  valACYclovir (VALTREX) 500 MG tablet Take 1 tablet (500 mg total) by mouth 2 (two) times daily as needed. 12/04/18   Farrel Conners, CNM    Family History Family History  Problem Relation Age of Onset   Hypertension Mother    Hypertension Father    Hypertension Brother    Cancer Brother 19   Hypertension Maternal Grandmother     Hypertension Maternal Grandfather    Hypertension Paternal Grandmother    Hypertension Paternal Grandfather     Social History Social History   Tobacco Use   Smoking status: Never   Smokeless tobacco: Never  Vaping Use   Vaping Use: Never used  Substance Use Topics   Alcohol use: Yes    Alcohol/week: 0.0 standard drinks    Comment: occ   Drug use: No     Allergies   Pseudoeph-doxylamine-dm-apap   Review of Systems Review of Systems  Constitutional:  Negative for chills and fever.  HENT:  Positive for ear pain. Negative for sore throat.   Respiratory:  Negative for cough and shortness of breath.   Cardiovascular:  Negative for chest pain and palpitations.  Gastrointestinal:  Negative for vomiting.  Musculoskeletal:  Positive for neck pain. Negative for arthralgias, back pain, gait problem and joint swelling.  Skin:  Negative for color change and rash.  Neurological:  Positive for dizziness. Negative for seizures, syncope, facial asymmetry, speech difficulty, weakness, numbness and headaches.  All other systems reviewed and are negative.   Physical Exam Triage Vital Signs ED Triage Vitals  Enc Vitals Group     BP      Pulse      Resp      Temp      Temp src      SpO2      Weight      Height      Head Circumference      Peak Flow      Pain Score      Pain Loc      Pain Edu?      Excl. in GC?    Orthostatic VS for the past 24 hrs:  BP- Lying Pulse- Lying BP- Sitting Pulse- Sitting BP- Standing at 0 minutes Pulse- Standing at 0 minutes  01/05/21 0952 152/89 75 (!) 144/98 80 (!) 140/97 80    Updated Vital Signs BP (!) 149/91 (BP Location: Left Arm)    Pulse 78    Temp 98.2 F (36.8 C) (Oral)    Resp 20    LMP  (LMP Unknown)    SpO2 96%   Visual Acuity Right Eye Distance:   Left Eye Distance:   Bilateral Distance:    Right Eye Near:   Left Eye Near:    Bilateral Near:     Physical Exam Vitals and nursing note reviewed.  Constitutional:       General: She is not in acute distress.    Appearance: She is well-developed. She is obese. She is not ill-appearing.  HENT:  Head: Normocephalic and atraumatic.     Right Ear: Tympanic membrane normal.     Left Ear: Tympanic membrane normal.     Nose: Nose normal.     Mouth/Throat:     Mouth: Mucous membranes are moist.     Pharynx: Oropharynx is clear.  Eyes:     Extraocular Movements: Extraocular movements intact.     Pupils: Pupils are equal, round, and reactive to light.  Neck:     Comments: Mild tenderness of right lateral and posterior neck muscles. Decreased ROM of neck due to muscular discomfort.  Cardiovascular:     Rate and Rhythm: Normal rate and regular rhythm.     Heart sounds: Normal heart sounds.  Pulmonary:     Effort: Pulmonary effort is normal. No respiratory distress.     Breath sounds: Normal breath sounds.  Musculoskeletal:        General: No swelling or deformity.     Cervical back: Neck supple. Tenderness present.  Skin:    General: Skin is warm and dry.     Capillary Refill: Capillary refill takes less than 2 seconds.     Findings: No bruising, erythema, lesion or rash.  Neurological:     General: No focal deficit present.     Mental Status: She is alert and oriented to person, place, and time.     Sensory: No sensory deficit.     Motor: No weakness.     Coordination: Romberg sign negative.     Gait: Gait normal.  Psychiatric:        Mood and Affect: Mood normal.        Behavior: Behavior normal.     UC Treatments / Results  Labs (all labs ordered are listed, but only abnormal results are displayed) Labs Reviewed - No data to display  EKG   Radiology No results found.  Procedures Procedures (including critical care time)  Medications Ordered in UC Medications - No data to display  Initial Impression / Assessment and Plan / UC Course  I have reviewed the triage vital signs and the nursing notes.  Pertinent labs & imaging results  that were available during my care of the patient were reviewed by me and considered in my medical decision making (see chart for details).   Torticollis, Dizziness.  Afebrile, well-appearing, exam is reassuring.  Treating with methocarbamol; precautions for drowsiness with this medication discussed.  Discussed Tylenol or ibuprofen as needed for discomfort.  Education provided on torticollis and on dizziness.  ED precautions discussed.  Instructed patient to follow-up with her PCP.  Work note provided per patient request.  She agrees to plan of care.     Final Clinical Impressions(s) / UC Diagnoses   Final diagnoses:  Torticollis  Dizziness     Discharge Instructions      Take the methocarbamol as needed for muscle spasm. Do not drive, operate machinery, or drink alcohol with this medication as it may cause drowsiness.    Go to the emergency department if you have persistent or worsening symptoms.    Follow up with your primary care provider.      ED Prescriptions     Medication Sig Dispense Auth. Provider   methocarbamol (ROBAXIN) 500 MG tablet Take 1 tablet (500 mg total) by mouth 2 (two) times daily as needed for muscle spasms. 10 tablet Mickie Bailate, Dannetta Lekas H, NP      PDMP not reviewed this encounter.   Mickie Bailate, Arsh Feutz H, NP 01/05/21 1023

## 2021-01-05 NOTE — Telephone Encounter (Signed)
[facility] - Client TELEPHONE ADVICE RECORD [facility] Patient Name: Lacey Rodgers Gender: Female DOB: 1980/10/20 Age: 41 Y 10 M 25 D Return Phone Number: [phone] (Primary), [phone] (Secondary) Address: City/ State/ Zip: [place] [address] Client [facility] - Client Client Site [place] - Day Provider Owens Loffler - MD Contact Type Call Who Is Calling Patient / Member / Family / Caregiver Call Type Triage / Clinical Relationship To Patient Self Return Phone Number [phone] (Primary) Chief Complaint Dizziness Reason for Call Symptomatic / Request for [place] states she has been experiencing dizziness, headaches and neck pain. [facility] Translation No Nurse Assessment Nurse: Zenia Resides, RN, Diane Date/Time Eilene Ghazi Time): 01/05/2021 8:41:39 AM Confirm and document reason for call. If symptomatic, describe symptoms. ---Caller states she has been experiencing dizziness, headaches and neck pain. Symptoms started 4 days ago with neck pain. No injuries. Pain is on the right side of the neck. Last night went to a severe headache. Pain is better this morning. Still has mild neck pain and some dizziness. Does the patient have any new or worsening symptoms? ---Yes Will a triage be completed? ---Yes Related visit to physician within the last 2 weeks? ---No Does the PT have any chronic conditions? (i.e. diabetes, asthma, this includes High risk factors for pregnancy, etc.) ---Yes List chronic conditions. ---HTN Is the patient pregnant or possibly pregnant? (Ask all females between the ages of 61-55) ---No Is this a behavioral health or substance abuse call? ---No Guidelines Guideline Title Affirmed Question Affirmed Notes Nurse Date/Time (Eastern Time) Dizziness - Lightheadedness SEVERE dizziness (e.g., unable to  stand, requires support Zenia Resides, Therapist, sports, Diane 01/05/2021 8:43:32 AM PLEASE NOTE: All timestamps contained within this report are represented as [place] Standard Time. CONFIDENTIALTY NOTICE: This fax transmission is intended only for the addressee. It contains information that is legally privileged, confidential or otherwise protected from use or disclosure. If you are not the intended recipient, you are strictly prohibited from reviewing, disclosing, copying using or disseminating any of this information or taking any action in reliance on or regarding this information. If you have received this fax in error, please notify us immediately by telephone so that we can arrange for its return to [place]. Phone: [phone], Toll-Free: [phone], Fax: [phone] Page: 2 of 2 Call Id: [id number] Guidelines Guideline Title Affirmed Question Affirmed Notes Nurse Date/Time Eilene Ghazi Time) to walk, feels like passing out now) Disp. Time Eilene Ghazi Time) Disposition Final User 01/05/2021 8:45:39 AM Go to ED Now (or PCP triage) Yes Zenia Resides, RN, Diane Caller Disagree/Comply Comply Caller Understands Yes PreDisposition Call Doctor Care Advice Given Per Guideline GO TO ED NOW (OR PCP TRIAGE): ANOTHER ADULT SHOULD DRIVE: * It is better and safer if another adult drives instead of you. CARE ADVICE given per Dizziness (Adult) guideline. Referrals GO TO FACILITY OTHER - SPECIF

## 2021-01-05 NOTE — Telephone Encounter (Signed)
Per chart review tab pt is at [facility] in [place]. Sending note to Dr Lorelei Pont and Butch Penny CMA.

## 2021-01-05 NOTE — Discharge Instructions (Addendum)
Take the methocarbamol as needed for muscle spasm. Do not drive, operate machinery, or drink alcohol with this medication as it may cause drowsiness.    Go to the emergency department if you have persistent or worsening symptoms.    Follow up with your primary care provider.

## 2021-01-05 NOTE — ED Triage Notes (Signed)
Pt c/o right neck pain and dizziness x 4 days.

## 2021-01-06 ENCOUNTER — Other Ambulatory Visit: Payer: Self-pay

## 2021-01-20 ENCOUNTER — Other Ambulatory Visit: Payer: Self-pay

## 2021-01-31 ENCOUNTER — Other Ambulatory Visit: Payer: Self-pay | Admitting: Family Medicine

## 2021-01-31 ENCOUNTER — Other Ambulatory Visit: Payer: Self-pay | Admitting: Emergency Medicine

## 2021-02-01 ENCOUNTER — Other Ambulatory Visit: Payer: Self-pay

## 2021-02-01 ENCOUNTER — Other Ambulatory Visit: Payer: Self-pay | Admitting: Family Medicine

## 2021-02-01 MED FILL — Labetalol HCl Tab 100 MG: ORAL | 90 days supply | Qty: 180 | Fill #0 | Status: AC

## 2021-02-01 MED FILL — Methocarbamol Tab 500 MG: ORAL | 15 days supply | Qty: 30 | Fill #0 | Status: AC

## 2021-02-01 MED FILL — Bupropion HCl Tab ER 12HR 150 MG: ORAL | 90 days supply | Qty: 180 | Fill #0 | Status: AC

## 2021-02-01 NOTE — Telephone Encounter (Signed)
Last office visit 10/12/2020 with Lacey Rodgers for nasal congestion and acute otitis media.  Last refilled 01/05/2021 for #10 with no refills by Barkley Boards NP at Urgent Care visit on 01/05/21. No future appointments.  Refill?

## 2021-02-09 ENCOUNTER — Encounter: Payer: Self-pay | Admitting: Family Medicine

## 2021-02-09 NOTE — Telephone Encounter (Signed)
Office visit

## 2021-02-25 ENCOUNTER — Other Ambulatory Visit: Payer: Self-pay

## 2021-02-25 ENCOUNTER — Encounter: Payer: Self-pay | Admitting: Family

## 2021-02-25 ENCOUNTER — Ambulatory Visit (INDEPENDENT_AMBULATORY_CARE_PROVIDER_SITE_OTHER): Payer: No Typology Code available for payment source | Admitting: Family

## 2021-02-25 VITALS — BP 126/90 | HR 68 | Temp 98.2°F | Ht 59.0 in | Wt 240.0 lb

## 2021-02-25 DIAGNOSIS — B009 Herpesviral infection, unspecified: Secondary | ICD-10-CM | POA: Diagnosis not present

## 2021-02-25 MED ORDER — VALACYCLOVIR HCL 1 G PO TABS
1000.0000 mg | ORAL_TABLET | Freq: Two times a day (BID) | ORAL | 1 refills | Status: DC
  Filled 2021-02-25: qty 180, 90d supply, fill #0
  Filled 2021-06-29: qty 180, 90d supply, fill #1

## 2021-02-25 NOTE — Progress Notes (Signed)
Established Patient Office Visit  Subjective:  Patient ID: Lacey Rodgers, female    DOB: 30-Mar-1980  Age: 41 y.o. MRN: [id number]  CC:  Chief Complaint  Patient presents with   Abscess    Pt stated lower left side of the back--yellow discharge, redness, pain--2 months.    HPI Lacey Rodgers is here today with concerns.   Lower back lesion/nodule that she describes as a cluster of bumps that occurs every 5-6 months. Stays for about a week typically, but this time around has stayed around 'alot longer' and has occurred five times over the last two months. She does fele that it is itchy at times and is painful.   She does state that she does have a history of HSV2.   Past Medical History:  Diagnosis Date   Depression    Genital herpes    GERD (gastroesophageal reflux disease)    Hypertension    Migraine    Obesity affecting pregnancy     Past Surgical History:  Procedure Laterality Date   CESAREAN SECTION N/A 12/16/2015   Procedure: CESAREAN SECTION;  Surgeon: Will Bonnet, MD;  Location: [facility] ORS;  Service: Obstetrics;  Laterality: N/A;   CESAREAN SECTION WITH BILATERAL TUBAL LIGATION  12/02/2018   Procedure: CESAREAN SECTION WITH BILATERAL TUBAL LIGATION;  Surgeon: Homero Fellers, MD;  Location: [facility] ORS;  Service: Obstetrics;;  TOB 1850 WEIGHT 7lb 5oz LENGTH 19.75 APGAR 9/9   DILATION AND CURETTAGE OF UTERUS  01/2013    Family History  Problem Relation Age of Onset   Hypertension Mother    Hypertension Father    Hypertension Brother    Cancer Brother 78   Hypertension Maternal Grandmother    Hypertension Maternal Grandfather    Hypertension Paternal Grandmother    Hypertension Paternal Grandfather     Social History   Socioeconomic History   Marital status: Married    Spouse name: Not on file   Number of children: 1   Years of education: Not on file   Highest education level: Not on file  Occupational History   Not on file  Tobacco  Use   Smoking status: Never   Smokeless tobacco: Never  Vaping Use   Vaping Use: Never used  Substance and Sexual Activity   Alcohol use: Yes    Alcohol/week: 0.0 standard drinks    Comment: occ   Drug use: No   Sexual activity: Yes    Birth control/protection: Surgical    Comment: Tubal   Other Topics Concern   Not on file  Social History Narrative   Not on file   Social Determinants of Health   Financial Resource Strain: Not on file  Food Insecurity: Not on file  Transportation Needs: Not on file  Physical Activity: Not on file  Stress: Not on file  Social Connections: Not on file  Intimate Partner Violence: Not on file    Outpatient Medications Prior to Visit  Medication Sig Dispense Refill   buPROPion (WELLBUTRIN SR) 150 MG 12 hr tablet Take 1 tablet (150 mg total) by mouth 2 (two) times daily. 180 tablet 1   escitalopram (LEXAPRO) 20 MG tablet TAKE 1 TABLET BY MOUTH DAILY. 30 tablet 6   fluticasone (FLONASE) 50 MCG/ACT nasal spray Place 2 sprays into both nostrils daily. 16 g 6   labetalol (NORMODYNE) 100 MG tablet TAKE 1 TABLET BY MOUTH 2 TIMES DAILY. 180 tablet 1   methocarbamol (ROBAXIN) 500 MG tablet Take 1 tablet (  500 mg total) by mouth 2 (two) times daily as needed for muscle spasms. 30 tablet 1   Multiple Vitamin (MULTIVITAMIN) tablet Take 1 tablet by mouth daily.     omeprazole (PRILOSEC) 40 MG capsule Take 1 capsule (40 mg total) by mouth daily. 90 capsule 3   valACYclovir (VALTREX) 500 MG tablet Take 1 tablet (500 mg total) by mouth 2 (two) times daily as needed. 30 tablet 5   gabapentin (NEURONTIN) 100 MG capsule Follow titration until 3 capsules three times a day is reached. (Patient not taking: Reported on 10/12/2020) 270 capsule 3   predniSONE (DELTASONE) 20 MG tablet 2 tabs po daily for 5 days, then 1 tab po daily for 5 days 15 tablet 0   No facility-administered medications prior to visit.    Allergies  Allergen Reactions    Pseudoeph-Doxylamine-Dm-Apap Hives    ROS Review of Systems  Constitutional:  Negative for chills and fever.  HENT:  Negative for congestion, ear pain, sinus pressure and sore throat.   Respiratory:  Negative for cough, shortness of breath and wheezing.   Cardiovascular:  Negative for chest pain and palpitations.  Skin:  Positive for rash (tender lesions healing over on left lower back).     Objective:    Physical Exam Constitutional:      General: She is not in acute distress.    Appearance: Normal appearance. She is obese. She is not ill-appearing, toxic-appearing or diaphoretic.  Skin:    Findings: Rash present. Rash is vesicular (small localized patch base of erythema with vesicular and papular lesions with tenderness, clustered >20 vesicles).       Neurological:     Mental Status: She is alert.    BP 126/90    Pulse 68    Temp 98.2 F (36.8 C)    Ht 4\' 11"  (1.499 m)    Wt 240 lb (108.9 kg)    SpO2 98%    BMI 48.47 kg/m  Wt Readings from Last 3 Encounters:  02/25/21 240 lb (108.9 kg)  07/19/20 238 lb (108 kg)  06/24/20 236 lb (107 kg)     Health Maintenance Due  Topic Date Due   COVID-19 Vaccine (3 - Moderna risk series) 05/29/2019   INFLUENZA VACCINE  08/02/2020    There are no preventive care reminders to display for this patient.  Lab Results  Component Value Date   TSH 1.12 07/14/2020   Lab Results  Component Value Date   WBC 6.3 07/14/2020   HGB 13.7 07/14/2020   HCT 40.1 07/14/2020   MCV 86.8 07/14/2020   PLT 264.0 07/14/2020   Lab Results  Component Value Date   NA 139 07/14/2020   K 4.4 07/14/2020   CO2 28 07/14/2020   GLUCOSE 80 07/14/2020   BUN 17 07/14/2020   CREATININE 0.98 07/14/2020   BILITOT 0.6 10/18/2020   ALKPHOS 38 10/18/2020   AST 19 10/18/2020   ALT 22 10/18/2020   PROT 7.0 10/18/2020   ALBUMIN 4.1 10/18/2020   CALCIUM 9.3 07/14/2020   ANIONGAP 11 12/02/2018   GFR 72.75 07/14/2020   Lab Results  Component Value Date    HGBA1C 5.8 07/14/2020      Assessment & Plan:   Problem List Items Addressed This Visit       Other   HSV-2 (herpes simplex virus 2) infection - Primary    Localized vesicular rash likely HSV-2.  Changing patient to suppression with Valtrex 1000 mg once daily.  Patient to  follow-up if no improvement in symptoms in the next few weeks      Relevant Medications   valACYclovir (VALTREX) 1000 MG tablet    Meds ordered this encounter  Medications   valACYclovir (VALTREX) 1000 MG tablet    Sig: Take 1 tablet (1,000 mg total) by mouth 2 (two) times daily.    Dispense:  180 tablet    Refill:  1    Order Specific Question:   Supervising Provider    Answer:   BEDSOLE, AMY E [[id number]]    Follow-up: Return if symptoms worsen or fail to improve.    Eugenia Pancoast, FNP

## 2021-02-25 NOTE — Patient Instructions (Signed)
I recommend that you start daily suppression of valtrex as I believe this will help.   It was a pleasure seeing you today! Please do not hesitate to reach out with any questions and or concerns.  Regards,   Eugenia Pancoast FNP-C

## 2021-02-28 DIAGNOSIS — B009 Herpesviral infection, unspecified: Secondary | ICD-10-CM | POA: Insufficient documentation

## 2021-02-28 NOTE — Assessment & Plan Note (Signed)
Localized vesicular rash likely HSV-2.  Changing patient to suppression with Valtrex 1000 mg once daily.  Patient to follow-up if no improvement in symptoms in the next few weeks

## 2021-03-07 ENCOUNTER — Other Ambulatory Visit: Payer: Self-pay

## 2021-03-24 ENCOUNTER — Other Ambulatory Visit: Payer: Self-pay

## 2021-03-24 ENCOUNTER — Ambulatory Visit
Admission: EM | Admit: 2021-03-24 | Discharge: 2021-03-24 | Disposition: A | Discharge status: Home / Self Care | Payer: No Typology Code available for payment source | Attending: Physician Assistant | Admitting: Physician Assistant

## 2021-03-24 ENCOUNTER — Encounter: Payer: Self-pay | Admitting: Emergency Medicine

## 2021-03-24 DIAGNOSIS — Z20818 Contact with and (suspected) exposure to other bacterial communicable diseases: Secondary | ICD-10-CM | POA: Diagnosis not present

## 2021-03-24 DIAGNOSIS — H9203 Otalgia, bilateral: Secondary | ICD-10-CM | POA: Diagnosis not present

## 2021-03-24 DIAGNOSIS — J029 Acute pharyngitis, unspecified: Secondary | ICD-10-CM

## 2021-03-24 LAB — GROUP A STREP BY PCR: Group A Strep by PCR: NOT DETECTED

## 2021-03-24 MED ORDER — AMOXICILLIN 500 MG PO CAPS
500.0000 mg | ORAL_CAPSULE | Freq: Two times a day (BID) | ORAL | 0 refills | Status: AC
  Filled 2021-03-24: qty 20, 10d supply, fill #0

## 2021-03-24 NOTE — ED Triage Notes (Signed)
Pt c/o sore throat and bilateral ear pain. Started about 3 days ago. Denies fever.  ?

## 2021-03-24 NOTE — ED Provider Notes (Signed)
?[facility] ? ? ? ?CSN: [id number] ?Arrival date & time: 03/24/21  [id number] ? ? ?  ? ?History   ?Chief Complaint ?Chief Complaint  ?Patient presents with  ? Sore Throat  ? Otalgia  ?  bilateral  ? ? ?HPI ?Lacey Rodgers is a 41 y.o. female presenting for bilateral ear pain and sore throat over the past 3 days.  States she felt feverish the other day but did not recorded temperature.  She reports having some mild congestion and cough off and on for the past month.  States her children have been sick with colds.  She reports one of her children had strep throat last week.  She does not report any breathing trouble, nausea/vomiting or diarrhea.  Taking over-the-counter ibuprofen and Tylenol for discomfort.  No other complaints. ? ?HPI ? ?Past Medical History:  ?Diagnosis Date  ? Depression   ? Genital herpes   ? GERD (gastroesophageal reflux disease)   ? Hypertension   ? Migraine   ? Obesity affecting pregnancy   ? ? ?Patient Active Problem List  ? Diagnosis Date Noted  ? HSV-2 (herpes simplex virus 2) infection 02/28/2021  ? Nasal congestion 10/12/2020  ? Acute otitis media 10/12/2020  ? Numbness and tingling in both hands 06/24/2020  ? BMI 40.0-44.9, adult (HCC) 05/22/2018  ? Moderate recurrent major depression (HCC) 02/08/2017  ? Migraine aura, persistent 10/10/2012  ? GENITAL HERPES 10/23/2008  ? COMMON MIGRAINE 10/23/2008  ? Essential hypertension 10/23/2008  ? GERD 10/23/2008  ? ? ?Past Surgical History:  ?Procedure Laterality Date  ? CESAREAN SECTION N/A 12/16/2015  ? Procedure: CESAREAN SECTION;  Surgeon: Conard NovakStephen D Jackson, MD;  Location: [facility] ORS;  Service: Obstetrics;  Laterality: N/A;  ? CESAREAN SECTION WITH BILATERAL TUBAL LIGATION  12/02/2018  ? Procedure: CESAREAN SECTION WITH BILATERAL TUBAL LIGATION;  Surgeon: Natale MilchSchuman, Christanna R, MD;  Location: [facility] ORS;  Service: Obstetrics;;  TOB 1850 ?WEIGHT 7lb 5oz ?LENGTH 19.75 ?APGAR 9/9  ? DILATION AND CURETTAGE OF UTERUS  01/2013  ? ? ?OB History    ? ? Gravida  ?4  ? Para  ?2  ? Term  ?2  ? Preterm  ?0  ? AB  ?2  ? Living  ?2  ?  ? ? SAB  ?   ? IAB  ?   ? Ectopic  ?   ? Multiple  ?0  ? Live Births  ?2  ?   ?  ?  ? ? ? ?Home Medications   ? ?Prior to Admission medications   ?Medication Sig Start Date End Date Taking? Authorizing Provider  ?amoxicillin (AMOXIL) 500 MG capsule Take 1 capsule (500 mg total) by mouth 2 (two) times daily for 10 days. 03/24/21 04/03/21 Yes Shirlee LatchEaves, Gildardo Tickner B, PA-C  ?buPROPion (WELLBUTRIN SR) 150 MG 12 hr tablet Take 1 tablet (150 mg total) by mouth 2 (two) times daily. 02/01/21  Yes Copland, Karleen HampshireSpencer, MD  ?escitalopram (LEXAPRO) 20 MG tablet TAKE 1 TABLET BY MOUTH DAILY. 11/12/20 11/12/21 Yes Schuman, Christanna R, MD  ?labetalol (NORMODYNE) 100 MG tablet TAKE 1 TABLET BY MOUTH 2 TIMES DAILY. 02/01/21 02/01/22 Yes Copland, Karleen HampshireSpencer, MD  ?methocarbamol (ROBAXIN) 500 MG tablet Take 1 tablet (500 mg total) by mouth 2 (two) times daily as needed for muscle spasms. 02/01/21  Yes Copland, Karleen HampshireSpencer, MD  ?Multiple Vitamin (MULTIVITAMIN) tablet Take 1 tablet by mouth daily.   Yes [provider]  ?omeprazole (PRILOSEC) 40 MG capsule Take 1 capsule (40 mg  total) by mouth daily. 07/19/20  Yes Copland, Karleen Hampshire, MD  ?valACYclovir (VALTREX) 1000 MG tablet Take 1 tablet (1,000 mg total) by mouth 2 (two) times daily. 02/25/21 08/24/21 Yes Mort Sawyers, FNP  ?fluticasone (FLONASE) 50 MCG/ACT nasal spray Place 2 sprays into both nostrils daily. 10/12/20   Eden Emms, NP  ?gabapentin (NEURONTIN) 100 MG capsule Follow titration until 3 capsules three times a day is reached. ?Patient not taking: Reported on 10/12/2020 07/19/20   [name], MD  ? ? ?Family History ?Family History  ?Problem Relation Age of Onset  ? Hypertension Mother   ? Hypertension Father   ? Hypertension Brother   ? Cancer Brother 81  ? Hypertension Maternal Grandmother   ? Hypertension Maternal Grandfather   ? Hypertension Paternal Grandmother   ? Hypertension Paternal  Grandfather   ? ? ?Social History ?Social History  ? ?Tobacco Use  ? Smoking status: Never  ? Smokeless tobacco: Never  ?Vaping Use  ? Vaping Use: Never used  ?Substance Use Topics  ? Alcohol use: Yes  ?  Alcohol/week: 0.0 standard drinks  ?  Comment: occ  ? Drug use: No  ? ? ? ?Allergies   ?Pseudoeph-doxylamine-dm-apap ? ? ?Review of Systems ?Review of Systems  ?Constitutional:  Negative for chills, diaphoresis, fatigue and fever.  ?HENT:  Positive for congestion, ear pain and sore throat. Negative for hearing loss, rhinorrhea, sinus pressure and sinus pain.   ?Respiratory:  Positive for cough. Negative for shortness of breath.   ?Gastrointestinal:  Negative for abdominal pain, nausea and vomiting.  ?Musculoskeletal:  Negative for arthralgias and myalgias.  ?Skin:  Negative for rash.  ?Neurological:  Negative for weakness and headaches.  ?Hematological:  Negative for adenopathy.  ? ? ?Physical Exam ?Triage Vital Signs ?ED Triage Vitals [03/24/21 0820]  ?Enc Vitals Group  ?   BP   ?   Pulse   ?   Resp   ?   Temp   ?   Temp src   ?   SpO2   ?   Weight 240 lb 1.3 oz (108.9 kg)  ?   Height 4\' 11"  (1.499 m)  ?   Head Circumference   ?   Peak Flow   ?   Pain Score 6  ?   Pain Loc   ?   Pain Edu?   ?   Excl. in GC?   ? ?No data found. ? ?Updated Vital Signs ?BP (!) 161/100 (BP Location: Left Arm)   Pulse 93   Temp 98.4 ?F (36.9 ?C) (Oral)   Resp 18   Ht 4\' 11"  (1.499 m)   Wt 240 lb 1.3 oz (108.9 kg)   LMP 03/03/2021 (Approximate)   SpO2 99%   BMI 48.49 kg/m?  ?   ? ?Physical Exam ?Vitals and nursing note reviewed.  ?Constitutional:   ?   General: She is not in acute distress. ?   Appearance: Normal appearance. She is ill-appearing. She is not toxic-appearing.  ?HENT:  ?   Head: Normocephalic and atraumatic.  ?   Right Ear: Tympanic membrane, ear canal and external ear normal.  ?   Left Ear: Tympanic membrane, ear canal and external ear normal.  ?   Nose: Nose normal.  ?   Mouth/Throat:  ?   Mouth: Mucous membranes  are moist.  ?   Pharynx: Oropharynx is clear. Posterior oropharyngeal erythema present.  ?Eyes:  ?   General: No scleral icterus.    ?  Right eye: No discharge.     ?   Left eye: No discharge.  ?   Conjunctiva/sclera: Conjunctivae normal.  ?Cardiovascular:  ?   Rate and Rhythm: Normal rate and regular rhythm.  ?   Heart sounds: Normal heart sounds.  ?Pulmonary:  ?   Effort: Pulmonary effort is normal. No respiratory distress.  ?   Breath sounds: Normal breath sounds.  ?Musculoskeletal:  ?   Cervical back: Neck supple.  ?Lymphadenopathy:  ?   Cervical: Cervical adenopathy present.  ?Skin: ?   General: Skin is dry.  ?Neurological:  ?   General: No focal deficit present.  ?   Mental Status: She is alert. Mental status is at baseline.  ?   Motor: No weakness.  ?   Gait: Gait normal.  ?Psychiatric:     ?   Mood and Affect: Mood normal.     ?   Behavior: Behavior normal.     ?   Thought Content: Thought content normal.  ? ? ? ?UC Treatments / Results  ?Labs ?(all labs ordered are listed, but only abnormal results are displayed) ?Labs Reviewed  ?GROUP A STREP BY PCR  ? ? ?EKG ? ? ?Radiology ?No results found. ? ?Procedures ?Procedures (including critical care time) ? ?Medications Ordered in UC ?Medications - No data to display ? ?Initial Impression / Assessment and Plan / UC Course  ?I have reviewed the triage vital signs and the nursing notes. ? ?Pertinent labs & imaging results that were available during my care of the patient were reviewed by me and considered in my medical decision making (see chart for details). ? ?41 year old female presenting for sore throat and bilateral ear pain for the past 3 days.  Exposed to strep throat last week but her child.  No associated fever.  BP elevated 169/100.  She is afebrile.  She is mildly ill-appearing but nontoxic.  HEENT exam significant for erythema posterior pharynx with 1+ enlarged tonsil on the right, tender and enlarged anterior cervical lymphadenopathy, otherwise normal  remainder of exam.  Chest clear auscultation heart regular rate rhythm. ? ?PCR strep test obtained.  Negative.  Discussed result with patient. ? ?As patient symptoms may be due to a virus or allergies and start taki

## 2021-03-24 NOTE — Discharge Instructions (Signed)
-  Your strep test was negative but it still possible you could have strep throat especially given your exposure.  I have printed a prescription for an antibiotic in case your throat is not feeling better in the next couple of days or if you start to have a fever or feel worse. ?- Start taking over-the-counter Mucinex and staying hydrated, Chloraseptic spray and cough drops for symptoms.  Continue Tylenol Motrin if needed.  ?

## 2021-04-10 ENCOUNTER — Encounter: Payer: Self-pay | Admitting: Family Medicine

## 2021-04-11 ENCOUNTER — Other Ambulatory Visit: Payer: Self-pay

## 2021-04-11 ENCOUNTER — Ambulatory Visit
Admission: EM | Admit: 2021-04-11 | Discharge: 2021-04-11 | Disposition: A | Discharge status: Home / Self Care | Payer: No Typology Code available for payment source | Attending: Internal Medicine | Admitting: Internal Medicine

## 2021-04-11 ENCOUNTER — Encounter: Payer: Self-pay | Admitting: Emergency Medicine

## 2021-04-11 DIAGNOSIS — N12 Tubulo-interstitial nephritis, not specified as acute or chronic: Secondary | ICD-10-CM | POA: Insufficient documentation

## 2021-04-11 LAB — URINALYSIS, ROUTINE W REFLEX MICROSCOPIC
Bilirubin Urine: NEGATIVE
Glucose, UA: NEGATIVE mg/dL
Ketones, ur: NEGATIVE mg/dL
Nitrite: NEGATIVE
Protein, ur: 100 mg/dL — AB
Specific Gravity, Urine: 1.015 (ref 1.005–1.030)
pH: 7 (ref 5.0–8.0)

## 2021-04-11 LAB — URINALYSIS, MICROSCOPIC (REFLEX)
RBC / HPF: >50 RBC/hpf (ref 0–5)
WBC, UA: >50 WBC/hpf (ref 0–5)

## 2021-04-11 MED ORDER — ACETAMINOPHEN 500 MG PO TABS
1000.0000 mg | ORAL_TABLET | Freq: Once | ORAL | Status: AC
  Administered 2021-04-11: 1000 mg via ORAL

## 2021-04-11 MED ORDER — CIPROFLOXACIN HCL 500 MG PO TABS
500.0000 mg | ORAL_TABLET | Freq: Two times a day (BID) | ORAL | 0 refills | Status: DC
Start: 2021-04-11 — End: 2021-04-13
  Filled 2021-04-11: qty 20, 10d supply, fill #0

## 2021-04-11 MED ORDER — ONDANSETRON 8 MG PO TBDP
8.0000 mg | ORAL_TABLET | Freq: Once | ORAL | Status: AC
  Administered 2021-04-11: 8 mg via ORAL

## 2021-04-11 MED ORDER — PHENAZOPYRIDINE HCL 200 MG PO TABS
200.0000 mg | ORAL_TABLET | Freq: Three times a day (TID) | ORAL | 0 refills | Status: DC
  Filled 2021-04-11: qty 6, 2d supply, fill #0

## 2021-04-11 NOTE — ED Triage Notes (Signed)
Symptoms started Friday evening.  Complains of pain at the end of urinary stream.  Muscle spasms in back.  Feels like she has to constantly urinate.  Patient has noticed pink with wiping on tissue ?

## 2021-04-11 NOTE — ED Provider Notes (Signed)
[facility]    CSN: [id number] Arrival date & time: 04/11/21  0913      History   Chief Complaint Chief Complaint  Patient presents with   Urinary Tract Infection    HPI Lacey Rodgers is a 41 y.o. female who presents with onset of dysuria at the end of urination, urgency and frequency. She noticed pink matter on the tissue when she whipped since yesterday. Also been having L flank pain. She felt feverish this am, but did not check her temp. Has never had a UTI.      Past Medical History:  Diagnosis Date   Depression    Genital herpes    GERD (gastroesophageal reflux disease)    Hypertension    Migraine    Obesity affecting pregnancy     Patient Active Problem List   Diagnosis Date Noted   HSV-2 (herpes simplex virus 2) infection 02/28/2021   Nasal congestion 10/12/2020   Acute otitis media 10/12/2020   Numbness and tingling in both hands 06/24/2020   BMI 40.0-44.9, adult (HCC) 05/22/2018   Moderate recurrent major depression (HCC) 02/08/2017   Migraine aura, persistent 10/10/2012   GENITAL HERPES 10/23/2008   COMMON MIGRAINE 10/23/2008   Essential hypertension 10/23/2008   GERD 10/23/2008    Past Surgical History:  Procedure Laterality Date   CESAREAN SECTION N/A 12/16/2015   Procedure: CESAREAN SECTION;  Surgeon: Conard Novak, MD;  Location: [facility] ORS;  Service: Obstetrics;  Laterality: N/A;   CESAREAN SECTION WITH BILATERAL TUBAL LIGATION  12/02/2018   Procedure: CESAREAN SECTION WITH BILATERAL TUBAL LIGATION;  Surgeon: Natale Milch, MD;  Location: [facility] OR[facility];  Service: Obstetrics;;  TOB 1850 WEIGHT 7lb 5oz LENGTH 19.75 APGAR 9/9   DILATION AND CURETTAGE OF UTERUS  01/2013    OB History     Gravida  4   Para  2   Term  2   Preterm  0   AB  2   Living  2      SAB      IAB      Ectopic      Multiple  0   Live Births  2            Home Medications    Prior to Admission medications   Medication Sig  Start Date End Date Taking? Authorizing Provider  ciprofloxacin (CIPRO) 500 MG tablet Take 1 tablet (500 mg total) by mouth 2 (two) times daily. 04/11/21  Yes Rodriguez-Southworth, Nettie Elm, PA-C  NON FORMULARY Over the counter medicine for bladder, "makes your urine orange"   Yes [provider]  phenazopyridine (PYRIDIUM) 200 MG tablet Take 1 tablet (200 mg total) by mouth 3 (three) times daily. 04/11/21  Yes Rodriguez-Southworth, Nettie Elm, PA-C  buPROPion [facility] SR) 150 MG 12 hr tablet Take 1 tablet (150 mg total) by mouth 2 (two) times daily. 02/01/21   Copland, Karleen Hampshire, MD  escitalopram (LEXAPRO) 20 MG tablet TAKE 1 TABLET BY MOUTH DAILY. 11/12/20 11/12/21  Schuman, Jaquelyn Bitter, MD  fluticasone (FLONASE) 50 MCG/ACT nasal spray Place 2 sprays into both nostrils daily. Patient not taking: Reported on 04/11/2021 10/12/20   Eden Emms, NP  gabapentin (NEURONTIN) 100 MG capsule Follow titration until 3 capsules three times a day is reached. Patient not taking: Reported on 10/12/2020 07/19/20   Copland, Karleen Hampshire, MD  labetalol (NORMODYNE) 100 MG tablet TAKE 1 TABLET BY MOUTH 2 TIMES DAILY. 02/01/21 02/01/22  Copland, Karleen Hampshire, MD  methocarbamol (ROBAXIN) 500  MG tablet Take 1 tablet (500 mg total) by mouth 2 (two) times daily as needed for muscle spasms. 02/01/21   Copland, Karleen Hampshire, MD  Multiple Vitamin (MULTIVITAMIN) tablet Take 1 tablet by mouth daily.    [provider]  omeprazole (PRILOSEC) 40 MG capsule Take 1 capsule (40 mg total) by mouth daily. 07/19/20   Copland, Karleen Hampshire, MD  valACYclovir (VALTREX) 1000 MG tablet Take 1 tablet (1,000 mg total) by mouth 2 (two) times daily. 02/25/21 08/24/21  Mort Sawyers, FNP    Family History Family History  Problem Relation Age of Onset   Hypertension Mother    Hypertension Father    Hypertension Brother    Cancer Brother 23   Hypertension Maternal Grandmother    Hypertension Maternal Grandfather    Hypertension Paternal Grandmother     Hypertension Paternal Grandfather     Social History Social History   Tobacco Use   Smoking status: Never   Smokeless tobacco: Never  Vaping Use   Vaping Use: Never used  Substance Use Topics   Alcohol use: Yes    Alcohol/week: 0.0 standard drinks    Comment: occ   Drug use: No     Allergies   Pseudoeph-doxylamine-dm-apap   Review of Systems Review of Systems  Constitutional:  Positive for chills and fatigue.  HENT:  Negative for congestion and sore throat.   Respiratory:  Negative for cough.   Gastrointestinal:  Positive for nausea. Negative for abdominal pain and vomiting.  Genitourinary:  Positive for dysuria, flank pain, frequency and urgency.  Musculoskeletal:  Positive for back pain. Negative for myalgias.  Skin:  Negative for rash.  Neurological:  Negative for headaches.    Physical Exam Triage Vital Signs ED Triage Vitals  Enc Vitals Group     BP 04/11/21 0937 135/86     Pulse Rate 04/11/21 0937 (!) 106     Resp 04/11/21 0937 20     Temp 04/11/21 0937 100.3 F (37.9 C)     Temp Source 04/11/21 0937 Oral     SpO2 04/11/21 0937 95 %     Weight --      Height --      Head Circumference --      Peak Flow --      Pain Score 04/11/21 0933 8     Pain Loc --      Pain Edu? --      Excl. in GC? --    No data found.  Updated Vital Signs BP 135/86 (BP Location: Left Arm) Comment (BP Location): large cuff  Pulse (!) 106   Temp 100.3 F (37.9 C) (Oral)   Resp 20   LMP 03/29/2021   SpO2 95%   Visual Acuity Right Eye Distance:   Left Eye Distance:   Bilateral Distance:    Right Eye Near:   Left Eye Near:    Bilateral Near:      Physical Exam Vitals and nursing note reviewed.  Constitutional:      General: She is not in acute distress.    Appearance: She is not toxic-appearing.  HENT:     Head: Normocephalic.     Right Ear: External ear normal.     Left Ear: External ear normal.  Eyes:     General: No scleral icterus.     Conjunctiva/sclera: Conjunctivae normal.  Pulmonary:     Effort: Pulmonary effort is normal.  Abdominal:     General: Bowel sounds are normal.  Palpations: Abdomen is soft. There is no mass.     Tenderness: There is no guarding or rebound.     Comments: +CVA tenderness  on L Musculoskeletal:        General: Normal range of motion.     Cervical back: Neck supple.     Comments: BACK- no muscular tenderness on area of complaint with palpation  Skin:    General: Skin is warm and dry.     Findings: No rash.  Neurological:     Mental Status: She is alert and oriented to person, place, and time.     Gait: Gait normal.  Psychiatric:        Mood and Affect: Mood normal.        Behavior: Behavior normal.        Thought Content: Thought content normal.        Judgment: Judgment normal.    UC Treatments / Results  Labs (all labs ordered are listed, but only abnormal results are displayed) Labs Reviewed  URINALYSIS, ROUTINE W REFLEX MICROSCOPIC - Abnormal; Notable for the following components:      Result Value   APPearance CLOUDY (*)    Hgb urine dipstick LARGE (*)    Protein, ur 100 (*)    Leukocytes,Ua LARGE (*)    All other components within normal limits  URINALYSIS, MICROSCOPIC (REFLEX) - Abnormal; Notable for the following components:   Bacteria, UA MANY (*)    All other components within normal limits  URINE CULTURE    EKG   Radiology No results found.  Procedures Procedures (including critical care time)  Medications Ordered in UC Medications  acetaminophen (TYLENOL) tablet 1,000 mg (1,000 mg Oral Given 04/11/21 1006)  ondansetron (ZOFRAN-ODT) disintegrating tablet 8 mg (8 mg Oral Given 04/11/21 1006)    Initial Impression / Assessment and Plan / UC Course  I have reviewed the triage vital signs and the nursing notes.  Pertinent labs  results that were available during my care of the patient were reviewed by me and considered in my medical decision making (see  chart for details).   She was given Tylenol 1000 mg PO and Zofran ODT 8 mg while here.   I placed her on Cipro and Pyridium as noted.  Final Clinical Impressions(s) / UC Diagnoses   Final diagnoses:  Pyelonephritis     Discharge Instructions      If you get worse in the next 48 hours, please go to the ER     ED Prescriptions     Medication Sig Dispense Auth. Provider   ciprofloxacin (CIPRO) 500 MG tablet Take 1 tablet (500 mg total) by mouth 2 (two) times daily. 20 tablet Rodriguez-Southworth, Nettie Elm, PA-C   phenazopyridine (PYRIDIUM) 200 MG tablet Take 1 tablet (200 mg total) by mouth 3 (three) times daily. 6 tablet Rodriguez-Southworth, Nettie Elm, PA-C      PDMP not reviewed this encounter.   Garey Ham, PA-C 04/11/21 1343

## 2021-04-11 NOTE — Discharge Instructions (Signed)
If you get worse in the next 48 hours, please go to the ER ?

## 2021-04-12 ENCOUNTER — Other Ambulatory Visit: Payer: Self-pay

## 2021-04-13 ENCOUNTER — Other Ambulatory Visit: Payer: Self-pay

## 2021-04-13 MED ORDER — LEVOFLOXACIN 500 MG PO TABS
500.0000 mg | ORAL_TABLET | Freq: Every day | ORAL | 0 refills | Status: DC
  Filled 2021-04-13: qty 10, 10d supply, fill #0

## 2021-04-14 LAB — URINE CULTURE: Culture: >=100000 — AB

## 2021-05-17 ENCOUNTER — Other Ambulatory Visit: Payer: Self-pay

## 2021-06-04 ENCOUNTER — Ambulatory Visit
Admission: EM | Admit: 2021-06-04 | Discharge: 2021-06-04 | Disposition: A | Discharge status: Home / Self Care | Payer: No Typology Code available for payment source | Attending: Emergency Medicine | Admitting: Emergency Medicine

## 2021-06-04 ENCOUNTER — Encounter: Payer: Self-pay | Admitting: Emergency Medicine

## 2021-06-04 ENCOUNTER — Other Ambulatory Visit: Payer: Self-pay

## 2021-06-04 DIAGNOSIS — H66003 Acute suppurative otitis media without spontaneous rupture of ear drum, bilateral: Secondary | ICD-10-CM

## 2021-06-04 DIAGNOSIS — J069 Acute upper respiratory infection, unspecified: Secondary | ICD-10-CM

## 2021-06-04 MED ORDER — AMOXICILLIN-POT CLAVULANATE 875-125 MG PO TABS: 1.0000 | ORAL_TABLET | Freq: Two times a day (BID) | ORAL | 0 refills | Status: AC

## 2021-06-04 MED ORDER — PROMETHAZINE-DM 6.25-15 MG/5ML PO SYRP: 5.0000 mL | ORAL_SOLUTION | Freq: Four times a day (QID) | ORAL | 0 refills | Status: DC | PRN

## 2021-06-04 MED ORDER — IPRATROPIUM BROMIDE 0.06 % NA SOLN: 2.0000 | Freq: Four times a day (QID) | NASAL | 12 refills | Status: DC

## 2021-06-04 NOTE — ED Provider Notes (Signed)
[facility]    CSN: [id number] Arrival date & time: 06/04/21  0950      History   Chief Complaint Chief Complaint  Patient presents with   Otalgia    HPI ERIAN LARIVIERE is a 41 y.o. female.   HPI  41 year old female here for evaluation of left ear pain.  Patient reports that she has been experiencing off-and-on ear pain for couple of weeks in her left ear she had a dramatic increase in pressure and throbbing.  She took over-the-counter ibuprofen but it did not help with the pain.  She is also experiencing some decrease in her hearing in that ear.  She had associated symptoms of runny nose, nasal congestion, and postnasal drip for the past 2 weeks as well.  She does have a cough that is mostly productive in the mornings and improves throughout the day.  She denies any drainage from her left ear, ringing in the ear, or sore throat.  No shortness of breath or wheezing.  Past Medical History:  Diagnosis Date   Depression    Genital herpes    GERD (gastroesophageal reflux disease)    Hypertension    Migraine    Obesity affecting pregnancy     Patient Active Problem List   Diagnosis Date Noted   HSV-2 (herpes simplex virus 2) infection 02/28/2021   Nasal congestion 10/12/2020   Acute otitis media 10/12/2020   Numbness and tingling in both hands 06/24/2020   BMI 40.0-44.9, adult (HCC) 05/22/2018   Moderate recurrent major depression (HCC) 02/08/2017   Migraine aura, persistent 10/10/2012   GENITAL HERPES 10/23/2008   COMMON MIGRAINE 10/23/2008   Essential hypertension 10/23/2008   GERD 10/23/2008    Past Surgical History:  Procedure Laterality Date   CESAREAN SECTION N/A 12/16/2015   Procedure: CESAREAN SECTION;  Surgeon: Conard Novak, MD;  Location: [facility] OR[facility];  Service: Obstetrics;  Laterality: N/A;   CESAREAN SECTION WITH BILATERAL TUBAL LIGATION  12/02/2018   Procedure: CESAREAN SECTION WITH BILATERAL TUBAL LIGATION;  Surgeon: Natale Milch, MD;  Location: [facility] OR[facility];  Service: Obstetrics;;  TOB 1850 WEIGHT 7lb 5oz LENGTH 19.75 APGAR 9/9   DILATION AND CURETTAGE OF UTERUS  01/2013    OB History     Gravida  4   Para  2   Term  2   Preterm  0   AB  2   Living  2      SAB      IAB      Ectopic      Multiple  0   Live Births  2            Home Medications    Prior to Admission medications   Medication Sig Start Date End Date Taking? Authorizing Provider  amoxicillin-clavulanate (AUGMENTIN) 875-125 MG tablet Take 1 tablet by mouth every 12 (twelve) hours for 10 days. 06/04/21 06/14/21 Yes Becky Augusta, NP  buPROPion [facility] SR) 150 MG 12 hr tablet Take 1 tablet (150 mg total) by mouth 2 (two) times daily. 02/01/21  Yes Copland, Karleen Hampshire, MD  escitalopram (LEXAPRO) 20 MG tablet TAKE 1 TABLET BY MOUTH DAILY. 11/12/20 11/12/21 Yes Schuman, Christanna R, MD  ipratropium (ATROVENT) 0.06 % nasal spray Place 2 sprays into both nostrils 4 (four) times daily. 06/04/21  Yes Becky Augusta, NP  labetalol (NORMODYNE) 100 MG tablet TAKE 1 TABLET BY MOUTH 2 TIMES DAILY. 02/01/21 02/01/22 Yes Copland, Karleen Hampshire, MD  Multiple Vitamin (MULTIVITAMIN) tablet Take  1 tablet by mouth daily.   Yes [provider]  omeprazole (PRILOSEC) 40 MG capsule Take 1 capsule (40 mg total) by mouth daily. 07/19/20  Yes Copland, Karleen Hampshire, MD  promethazine-dextromethorphan (PROMETHAZINE-DM) 6.25-15 MG/5ML syrup Take 5 mLs by mouth 4 (four) times daily as needed. 06/04/21  Yes Becky Augusta, NP  valACYclovir (VALTREX) 1000 MG tablet Take 1 tablet (1,000 mg total) by mouth 2 (two) times daily. 02/25/21 08/24/21 Yes Dugal, Wyatt Mage, FNP  gabapentin (NEURONTIN) 100 MG capsule Follow titration until 3 capsules three times a day is reached. Patient not taking: Reported on 10/12/2020 07/19/20   [name], MD  NON FORMULARY Over the counter medicine for bladder, "makes your urine orange"    [provider]    Family History Family  History  Problem Relation Age of Onset   Hypertension Mother    Hypertension Father    Hypertension Brother    Cancer Brother 3   Hypertension Maternal Grandmother    Hypertension Maternal Grandfather    Hypertension Paternal Grandmother    Hypertension Paternal Grandfather     Social History Social History   Tobacco Use   Smoking status: Never   Smokeless tobacco: Never  Vaping Use   Vaping Use: Never used  Substance Use Topics   Alcohol use: Yes    Alcohol/week: 0.0 standard drinks    Comment: occ   Drug use: No     Allergies   Pseudoeph-doxylamine-dm-apap   Review of Systems Review of Systems  Constitutional:  Negative for fever.  HENT:  Positive for congestion, ear pain, hearing loss, postnasal drip and rhinorrhea. Negative for ear discharge, sore throat and tinnitus.   Respiratory:  Positive for cough. Negative for shortness of breath and wheezing.   Neurological:  Negative for dizziness.  Hematological: Negative.   Psychiatric/Behavioral: Negative.      Physical Exam Triage Vital Signs ED Triage Vitals  Enc Vitals Group     BP 06/04/21 1003 (!) 148/93     Pulse Rate 06/04/21 1003 87     Resp 06/04/21 1003 15     Temp 06/04/21 1003 98.1 F (36.7 C)     Temp Source 06/04/21 1003 Oral     SpO2 06/04/21 1003 98 %     Weight 06/04/21 1000 230 lb (104.3 kg)     Height 06/04/21 1000 4\' 11"  (1.499 m)     Head Circumference --      Peak Flow --      Pain Score 06/04/21 1000 5     Pain Loc --      Pain Edu? --      Excl. in GC? --    No data found.  Updated Vital Signs BP (!) 148/93 (BP Location: Left Arm)   Pulse 87   Temp 98.1 F (36.7 C) (Oral)   Resp 15   Ht 4\' 11"  (1.499 m)   Wt 230 lb (104.3 kg)   LMP 05/21/2021 (Approximate)   SpO2 98%   BMI 46.45 kg/m   Visual Acuity Right Eye Distance:   Left Eye Distance:   Bilateral Distance:    Right Eye Near:   Left Eye Near:    Bilateral Near:     Physical Exam Vitals and nursing note  reviewed.  Constitutional:      Appearance: Normal appearance. She is not ill-appearing.  HENT:     Head: Normocephalic and atraumatic.     Right Ear: Ear canal and external ear normal. There is no  impacted cerumen.     Left Ear: Ear canal and external ear normal. There is no impacted cerumen.     Nose: Congestion and rhinorrhea present.     Mouth/Throat:     Mouth: Mucous membranes are moist.     Pharynx: Oropharynx is clear. No posterior oropharyngeal erythema.  Cardiovascular:     Rate and Rhythm: Normal rate and regular rhythm.     Pulses: Normal pulses.     Heart sounds: Normal heart sounds. No murmur heard.   No friction rub. No gallop.  Pulmonary:     Effort: Pulmonary effort is normal.     Breath sounds: Normal breath sounds. No wheezing, rhonchi or rales.  Musculoskeletal:     Cervical back: Normal range of motion and neck supple.  Lymphadenopathy:     Cervical: No cervical adenopathy.  Skin:    General: Skin is warm and dry.     Capillary Refill: Capillary refill takes less than 2 seconds.     Findings: No erythema or rash.  Neurological:     General: No focal deficit present.     Mental Status: She is alert and oriented to person, place, and time.  Psychiatric:        Mood and Affect: Mood normal.        Behavior: Behavior normal.        Thought Content: Thought content normal.        Judgment: Judgment normal.     UC Treatments / Results  Labs (all labs ordered are listed, but only abnormal results are displayed) Labs Reviewed - No data to display  EKG   Radiology No results found.  Procedures Procedures (including critical care time)  Medications Ordered in UC Medications - No data to display  Initial Impression / Assessment and Plan / UC Course  I have reviewed the triage vital signs and the nursing notes.  Pertinent labs & imaging results that were available during my care of the patient were reviewed by me and considered in my medical decision  making (see chart for details).  Patient is a very pleasant, nontoxic-appearing 41 year old female here for evaluation of left ear pain as outlined HPI above.  Patient has been experiencing on and off ear pain for the last 2 weeks and this has been associated with other upper respiratory symptoms.  Her physical exam reveals bilateral erythematous and injected tympanic membranes with the left being worse than the right.  Both external auditory canals are clear.  Patient does have tenderness with palpation of the left eustachian tube as well on exam.  Her nasal mucosa is erythematous and edematous with clear discharge in both nares.  Oropharyngeal exam is benign.  No cervical lymphadenopathy appreciated exam.  Cardiopulmonary exam reveals S1-S2 heart sounds with regular rate and rhythm and lung sounds that are clear to auscultation in all fields.  Patient exam is consistent with an upper respiratory infection and otitis media bilaterally.  I will treat her with Augmentin twice daily for 10 days for the otitis media.  I will prescribe Atrovent nasal spray to help with the nasal congestion and postnasal drip which I believe is feeding her cough.  Patient does not have any significant cough during the day is mostly at nighttime and first in the morning.  I will also prescribe Promethazine DM cough syrup.  She does have a history of being allergic to pseudoephedrine, doxylamine, dextromethorphan, Tylenol combination.  She states that she has taken dextromethorphan, Tylenol, and Phenergan  by themselves without any difficulty.   Final Clinical Impressions(s) / UC Diagnoses   Final diagnoses:  Upper respiratory tract infection, unspecified type  Non-recurrent acute suppurative otitis media of both ears without spontaneous rupture of tympanic membranes     Discharge Instructions      Take the Augmentin twice daily for 10 days with food for treatment of your ear infection.  Take an over-the-counter probiotic  1 hour after each dose of antibiotic to prevent diarrhea.  Use over-the-counter Tylenol and ibuprofen as needed for pain or fever.  Place a hot water bottle, or heating pad, underneath your pillowcase at night to help dilate up your ear and aid in pain relief as well as resolution of the infection.  Use the Atrovent nasal spray, 2 squirts in each nostril every 6 hours, as needed for runny nose and postnasal drip.  Use the Promethazine DM cough syrup at bedtime for cough and congestion.  It will make you drowsy so do not take it during the day.  Return for reevaluation or see your primary care provider for any new or worsening symptoms.       ED Prescriptions     Medication Sig Dispense Auth. Provider   amoxicillin-clavulanate (AUGMENTIN) 875-125 MG tablet Take 1 tablet by mouth every 12 (twelve) hours for 10 days. 20 tablet Becky Augusta, NP   ipratropium (ATROVENT) 0.06 % nasal spray Place 2 sprays into both nostrils 4 (four) times daily. 15 mL Becky Augusta, NP   promethazine-dextromethorphan (PROMETHAZINE-DM) 6.25-15 MG/5ML syrup Take 5 mLs by mouth 4 (four) times daily as needed. 118 mL Becky Augusta, NP      PDMP not reviewed this encounter.   Becky Augusta, NP 06/04/21 1029

## 2021-06-04 NOTE — ED Triage Notes (Signed)
Patient c/o left ear pain that started last night.  Patient denies fevers.  

## 2021-06-04 NOTE — Discharge Instructions (Signed)
Take the Augmentin twice daily for 10 days with food for treatment of your ear infection.  Take an over-the-counter probiotic 1 hour after each dose of antibiotic to prevent diarrhea.  Use over-the-counter Tylenol and ibuprofen as needed for pain or fever.  Place a hot water bottle, or heating pad, underneath your pillowcase at night to help dilate up your ear and aid in pain relief as well as resolution of the infection.  Use the Atrovent nasal spray, 2 squirts in each nostril every 6 hours, as needed for runny nose and postnasal drip.  Use the Promethazine DM cough syrup at bedtime for cough and congestion.  It will make you drowsy so do not take it during the day.  Return for reevaluation or see your primary care provider for any new or worsening symptoms.

## 2021-06-10 ENCOUNTER — Encounter: Payer: Self-pay | Admitting: Family Medicine

## 2021-06-10 DIAGNOSIS — G8929 Other chronic pain: Secondary | ICD-10-CM

## 2021-06-16 ENCOUNTER — Ambulatory Visit (INDEPENDENT_AMBULATORY_CARE_PROVIDER_SITE_OTHER): Payer: No Typology Code available for payment source | Admitting: Family Medicine

## 2021-06-16 ENCOUNTER — Other Ambulatory Visit: Payer: Self-pay

## 2021-06-16 ENCOUNTER — Encounter: Payer: Self-pay | Admitting: Family Medicine

## 2021-06-16 VITALS — BP 152/90 | HR 91 | Temp 97.9°F | Ht 59.0 in | Wt 248.3 lb

## 2021-06-16 DIAGNOSIS — H6692 Otitis media, unspecified, left ear: Secondary | ICD-10-CM | POA: Diagnosis not present

## 2021-06-16 DIAGNOSIS — H6523 Chronic serous otitis media, bilateral: Secondary | ICD-10-CM

## 2021-06-16 MED ORDER — CEFDINIR 300 MG PO CAPS
300.0000 mg | ORAL_CAPSULE | Freq: Two times a day (BID) | ORAL | 0 refills | Status: DC
  Filled 2021-06-16: qty 20, 10d supply, fill #0

## 2021-06-16 MED ORDER — PREDNISONE 20 MG PO TABS
ORAL_TABLET | ORAL | 0 refills | Status: DC
  Filled 2021-06-16: qty 12, 8d supply, fill #0

## 2021-06-16 NOTE — Progress Notes (Signed)
Lacey Rodgers T. Yadier Bramhall, MD, CAQ Sports Medicine [facility] [address] [place], [address]  Phone: [phone]  FAX: [phone]  Lacey Rodgers - 41 y.o. female  MRN [id number]  Date of Birth: [birth year]  Date: 06/16/2021  PCP: [name], MD  Referral: [name], MD  Chief Complaint  Patient presents with   Ear Pain    Left-Seen at UC 2 weeks and treated with antibiotcs   Subjective:   Lacey Rodgers is a 41 y.o. very pleasant female patient with Body mass index is 50.15 kg/m. who presents with the following:  L ear, some pain in the R side.  Feels some fluid on the L.  2 weeks ago she was treated with Augmentin at UC.  Close to having tubes as a child.  Seemingly will get often ear infections.  2 months ago and 2 weeks ago.   6 months ago, also had OM.  All had been seen by MD's and diagnosed with OM.  Felt a ton of pain and severe pain, went to [facility] urgent care.  Felt like had an OM. - placed on Augmentin  Tried some OTC swimmer's ear drops Flonase, atrovent, mucinex DM. Still having problems with L > R ear, and it is still hurting and feeling full.  No sudafed - no afrin  Review of Systems is noted in the HPI, as appropriate  Objective:   BP (!) 152/90   Pulse 91   Temp 97.9 F (36.6 C) (Oral)   Ht 4\' 11"  (1.499 m)   Wt 248 lb 5 oz (112.6 kg)   LMP 05/21/2021 (Approximate)   SpO2 97%   BMI 50.15 kg/m   GEN: No acute distress; alert,appropriate. CV: RRR, no m/g/r  PULM: Normal respiratory rate, no accessory muscle use. No wheezes, crackles or rhonchi  ENT: TM on the R with serous fluid, on the L with obscured anatomy, cloudy with some mildish pink coloration.   PSYCH: Normally interactive.   Laboratory and Imaging Data:  Assessment and Plan:     ICD-10-CM   1. Acute otitis media, left  H66.92     2. Bilateral chronic serous otitis media  H65.23      Treatment failure with  failure of Augmentin Repeated bouts of OM - she has ENT appointment upcoming. Many OM in the last couple of years and lifelong.  Change to Omnicef, cont. Other supportive care, and burst of steroids for now.  Medication Management during today's office visit: Meds ordered this encounter  Medications   cefdinir (OMNICEF) 300 MG capsule    Sig: Take 1 capsule (300 mg total) by mouth 2 (two) times daily.    Dispense:  20 capsule    Refill:  0   predniSONE (DELTASONE) 20 MG tablet    Sig: 2 tabs po for 4 days, then 1 tab po for 4 days    Dispense:  12 tablet    Refill:  0   Medications Discontinued During This Encounter  Medication Reason   promethazine-dextromethorphan (PROMETHAZINE-DM) 6.25-15 MG/5ML syrup Completed Course    Orders placed today for conditions managed today: No orders of the defined types were placed in this encounter.   Follow-up if needed: No follow-ups on file.  Dragon [employer] One speech-to-text software was used for transcription in this dictation.  Possible transcriptional errors can occur using 03-[birth year]-1990.   Signed,  Animal nutritionist. Lylia Karn, MD   Outpatient Encounter Medications as of 06/16/2021  Medication Sig   buPROPion (WELLBUTRIN SR) 150 MG 12 hr tablet Take 1 tablet (150 mg total) by mouth 2 (two) times daily.   cefdinir (OMNICEF) 300 MG capsule Take 1 capsule (300 mg total) by mouth 2 (two) times daily.   escitalopram (LEXAPRO) 20 MG tablet TAKE 1 TABLET BY MOUTH DAILY.   gabapentin (NEURONTIN) 100 MG capsule Follow titration until 3 capsules three times a day is reached.   ipratropium (ATROVENT) 0.06 % nasal spray Place 2 sprays into both nostrils 4 (four) times daily.   labetalol (NORMODYNE) 100 MG tablet TAKE 1 TABLET BY MOUTH 2 TIMES DAILY.   Multiple Vitamin (MULTIVITAMIN) tablet Take 1 tablet by mouth daily.   NON FORMULARY Over the counter medicine for bladder, "makes your urine orange"   omeprazole (PRILOSEC) 40 MG capsule Take 1  capsule (40 mg total) by mouth daily.   predniSONE (DELTASONE) 20 MG tablet 2 tabs po for 4 days, then 1 tab po for 4 days   valACYclovir (VALTREX) 1000 MG tablet Take 1 tablet (1,000 mg total) by mouth 2 (two) times daily.   [DISCONTINUED] promethazine-dextromethorphan (PROMETHAZINE-DM) 6.25-15 MG/5ML syrup Take 5 mLs by mouth 4 (four) times daily as needed.   No facility-administered encounter medications on file as of 06/16/2021.

## 2021-06-16 NOTE — Patient Instructions (Addendum)
Try some   Afrin - over the counter - twice a day 2 sprays each nostril (Oxymetazoline)

## 2021-06-29 ENCOUNTER — Other Ambulatory Visit: Payer: Self-pay | Admitting: Family Medicine

## 2021-06-29 ENCOUNTER — Other Ambulatory Visit: Payer: Self-pay

## 2021-06-29 MED FILL — Labetalol HCl Tab 100 MG: ORAL | 90 days supply | Qty: 180 | Fill #1 | Status: AC

## 2021-06-29 MED FILL — Bupropion HCl Tab ER 12HR 150 MG: ORAL | 90 days supply | Qty: 180 | Fill #1 | Status: AC

## 2021-06-30 ENCOUNTER — Other Ambulatory Visit: Payer: Self-pay

## 2021-06-30 MED ORDER — OMEPRAZOLE 40 MG PO CPDR
40.0000 mg | DELAYED_RELEASE_CAPSULE | Freq: Every day | ORAL | 0 refills | Status: DC
  Filled 2021-06-30: qty 90, 90d supply, fill #0

## 2021-06-30 NOTE — Telephone Encounter (Signed)
Please schedule CPE with fasting labs prior with Dr. Patsy Lager for after 07/19/21

## 2021-07-01 NOTE — Telephone Encounter (Signed)
LVM advising patient to call and schedule.

## 2021-07-08 ENCOUNTER — Emergency Department
Admission: EM | Admit: 2021-07-08 | Discharge: 2021-07-08 | Disposition: A | Discharge status: Home / Self Care | Payer: No Typology Code available for payment source | Attending: Emergency Medicine | Admitting: Emergency Medicine

## 2021-07-08 ENCOUNTER — Ambulatory Visit
Admission: RE | Admit: 2021-07-08 | Discharge: 2021-07-08 | Disposition: A | Discharge status: Home / Self Care | Payer: No Typology Code available for payment source | Source: Ambulatory Visit | Attending: Emergency Medicine | Admitting: Emergency Medicine

## 2021-07-08 ENCOUNTER — Telehealth: Payer: Self-pay | Admitting: Family Medicine

## 2021-07-08 ENCOUNTER — Encounter: Payer: Self-pay | Admitting: Emergency Medicine

## 2021-07-08 VITALS — BP 159/107 | HR 91 | Temp 98.2°F | Resp 14 | Ht 59.0 in | Wt 235.0 lb

## 2021-07-08 DIAGNOSIS — I10 Essential (primary) hypertension: Secondary | ICD-10-CM | POA: Diagnosis not present

## 2021-07-08 DIAGNOSIS — K439 Ventral hernia without obstruction or gangrene: Secondary | ICD-10-CM | POA: Insufficient documentation

## 2021-07-08 DIAGNOSIS — R1031 Right lower quadrant pain: Secondary | ICD-10-CM

## 2021-07-08 DIAGNOSIS — N200 Calculus of kidney: Secondary | ICD-10-CM | POA: Insufficient documentation

## 2021-07-08 LAB — URINALYSIS, ROUTINE W REFLEX MICROSCOPIC
Bilirubin Urine: NEGATIVE
Glucose, UA: NEGATIVE mg/dL
Hgb urine dipstick: NEGATIVE
Ketones, ur: NEGATIVE mg/dL
Leukocytes,Ua: NEGATIVE
Nitrite: NEGATIVE
Protein, ur: NEGATIVE mg/dL
Specific Gravity, Urine: 1.004 — ABNORMAL LOW (ref 1.005–1.030)
pH: 6 (ref 5.0–8.0)

## 2021-07-08 LAB — CBC WITH DIFFERENTIAL/PLATELET
Abs Immature Granulocytes: 0.01 10*3/uL (ref 0.00–0.07)
Basophils Absolute: 0 10*3/uL (ref 0.0–0.1)
Basophils Relative: 0 %
Eosinophils Absolute: 0.1 10*3/uL (ref 0.0–0.5)
Eosinophils Relative: 2 %
HCT: 44.3 % (ref 36.0–46.0)
Hemoglobin: 14.5 g/dL (ref 12.0–15.0)
Immature Granulocytes: 0 %
Lymphocytes Relative: 36 %
Lymphs Abs: 2.2 10*3/uL (ref 0.7–4.0)
MCH: 29 pg (ref 26.0–34.0)
MCHC: 32.7 g/dL (ref 30.0–36.0)
MCV: 88.6 fL (ref 80.0–100.0)
Monocytes Absolute: 0.4 10*3/uL (ref 0.1–1.0)
Monocytes Relative: 7 %
Neutro Abs: 3.5 10*3/uL (ref 1.7–7.7)
Neutrophils Relative %: 55 %
Platelets: 272 10*3/uL (ref 150–400)
RBC: 5 MIL/uL (ref 3.87–5.11)
RDW: 12.7 % (ref 11.5–15.5)
WBC: 6.2 10*3/uL (ref 4.0–10.5)
nRBC: 0 % (ref 0.0–0.2)

## 2021-07-08 LAB — COMPREHENSIVE METABOLIC PANEL
ALT: 17 U/L (ref 0–44)
AST: 14 U/L — ABNORMAL LOW (ref 15–41)
Albumin: 4.2 g/dL (ref 3.5–5.0)
Alkaline Phosphatase: 45 U/L (ref 38–126)
Anion gap: 9 (ref 5–15)
BUN: 16 mg/dL (ref 6–20)
CO2: 22 mmol/L (ref 22–32)
Calcium: 9.4 mg/dL (ref 8.9–10.3)
Chloride: 108 mmol/L (ref 98–111)
Creatinine, Ser: 0.9 mg/dL (ref 0.44–1.00)
GFR, Estimated: >60 mL/min (ref >60)
Glucose, Bld: 95 mg/dL (ref 70–99)
Potassium: 3.7 mmol/L (ref 3.5–5.1)
Sodium: 139 mmol/L (ref 135–145)
Total Bilirubin: 0.7 mg/dL (ref 0.3–1.2)
Total Protein: 7 g/dL (ref 6.5–8.1)

## 2021-07-08 LAB — POC URINE PREG, ED: Preg Test, Ur: NEGATIVE

## 2021-07-08 MED ORDER — KETOROLAC TROMETHAMINE 15 MG/ML IJ SOLN
15.0000 mg | Freq: Once | INTRAMUSCULAR | Status: AC
  Administered 2021-07-08: 15 mg via INTRAVENOUS
  Filled 2021-07-08: qty 1

## 2021-07-08 MED ORDER — OXYCODONE HCL 5 MG PO TABS
5.0000 mg | ORAL_TABLET | Freq: Three times a day (TID) | ORAL | 0 refills | Status: DC | PRN
  Filled 2021-07-08: qty 9, 3d supply, fill #0

## 2021-07-08 MED ORDER — TRAMADOL HCL 50 MG PO TABS
50.0000 mg | ORAL_TABLET | Freq: Once | ORAL | Status: AC
  Administered 2021-07-08: 50 mg via ORAL
  Filled 2021-07-08: qty 1

## 2021-07-08 MED ORDER — ONDANSETRON 4 MG PO TBDP
4.0000 mg | ORAL_TABLET | Freq: Once | ORAL | Status: AC
  Administered 2021-07-08: 4 mg via ORAL
  Filled 2021-07-08: qty 1

## 2021-07-08 MED ORDER — OXYCODONE HCL 5 MG PO TABS: 5.0000 mg | ORAL_TABLET | Freq: Three times a day (TID) | ORAL | 0 refills | Status: AC | PRN

## 2021-07-08 NOTE — ED Triage Notes (Signed)
Patient c/o RLQ abdominal pain that on Wed. Patient reports some nausea.  Patient reports history of ovarian cysts.  Patient denies fevers or chills.

## 2021-07-08 NOTE — Telephone Encounter (Addendum)
I spoke with pt; pt said the abd pain started on 07/06/21 like an abd cramping which has localized in rt lower side with pain level of 8 now. Pt is worse when sitting. If pt lightly presses on area that is hurting it does increase the pain, pt also noted that when riding in car if has and area where road has a hole or roughness to the road that makes pain in rt side worse. Pt does not have fever per pt. The pain is a constant nagging type pain. Pt does still have appendix.No UTI symptoms, no N&V or diarrhea or constipation. Pt was nauseated on 07/07/21.pt is at work now and wanted appt at [facility] late this afternoon and no late appts at [facility] today. No available appts at [facility] or [place].Offered pt an appt at [facility] but pt wanted to know what they would do and I advised could eval pt or with rt sided abd pain of pain level of 8 they may refer her to ED. Pt working at [employer] and may ask one of her doctors to see what to do. Pt said she is not sure what she is going to do. ED precautions given and pt voiced understanding.sending note to Dr Patsy Lager who is out of office and Lupita Leash CMA.   [facility] Day - Client TELEPHONE ADVICE RECORD Access[facility]urse Patient Name: Lacey Rodgers Gender: Female DOB: 1980-10-05 Age: 41 Y 4 M 27 D Return Phone Number: [phone] (Primary) Address: City/ State/ Zip: [name] [address] Client [facility] Day - Client Client Site [facility] - Day Provider [name] - MD Contact Type Call Who Is Calling Patient / Member / Family / Caregiver Call Type Triage / Clinical Relationship To Patient Self Return Phone Number [phone] (Primary) Chief Complaint SEVERE ABDOMINAL PAIN - Severe pain in abdomen Reason for Call Symptomatic / Request for Health Information Initial Comment Caller states she has been some pain her right ovary. Caller states it is pain in lower  abdominal area. Caller states the pain gets severe when she is sitting. Translation No Nurse Assessment Nurse: D'Heur Ezzard Standing, RN, Adrienne Date/Time (Eastern Time): 07/08/2021 8:45:02 AM Confirm and document reason for call. If symptomatic, describe symptoms. ---Caller states she has been some pain her right ovary. Caller states it is pain in lower abdominal area. Caller states the pain gets severe when she is sitting. She did have an ovarian cyst on that side five + years ago but it is gone as per patient. No fever. She is having lower back pain as well. Abdominal pain is intermittent. She can feel a hard, painful nodule in her lower abdomen. Current pain is 3/10. Symptoms started Wednesday. Does the patient have any new or worsening symptoms? ---Yes Will a triage be completed? ---Yes Related visit to physician within the last 2 weeks? ---No Does the PT have any chronic conditions? (i.e. diabetes, asthma, this includes High risk factors for pregnancy, etc.) ---Yes List chronic conditions. ---HTN Is the patient pregnant or possibly pregnant? (Ask all females between the ages of 42-55) ---No Is this a behavioral health or substance abuse call? ---No PLEASE NOTE: All timestamps contained within this report are represented as [place] Standard Time. CONFIDENTIALTY NOTICE: This fax transmission is intended only for the addressee. It contains information that is legally privileged, confidential or otherwise protected from use or disclosure. If you are not the intended recipient, you are strictly  prohibited from reviewing, disclosing, copying using or disseminating any of this information or taking any action in reliance on or regarding this information. If you have received this fax in error, please notify us immediately by telephone so that we can arrange for its return to [place]. Phone: [phone], Toll-Free: [phone], Fax: [phone] Page: 2 of 2 Call Id:  [id number] Guidelines Guideline Title Affirmed Question Affirmed Notes Nurse Date/Time [name] Time) Abdominal Pain - Female [1] MODERATE pain (e.g., interferes with normal activities) AND [2] pain comes and goes (cramps) AND [3] present > 24 hours (Exception: Pain with Vomiting or Diarrhea - see that Guideline.) D'Heur Ezzard Standing, RN, Adrienne 07/08/2021 8:48:27 AM Disp. Time [name] Time) Disposition Final User 07/08/2021 8:43:09 AM Send to Urgent Thermon Leyland 07/08/2021 8:51:44 AM See PCP within 24 Hours Yes D'Heur Ezzard Standing, RN, Adrienne Final Disposition 07/08/2021 8:51:44 AM See PCP within 24 Hours Yes D'Heur Ezzard Standing, RN, Hansel Starling Caller Disagree/Comply Comply Caller Understands Yes PreDisposition Call Doctor Care Advice Given Per Guideline SEE PCP WITHIN 24 HOURS: * IF OFFICE WILL BE OPEN: You need to be examined within the next 24 hours. Call your doctor (or NP/PA) when the office opens and make an appointment. DIET: * Drink adequate fluids. Eat a bland diet. CALL BACK IF: * Severe pain lasts over 1 hour * Constant pain lasts over 2 hours * You become worse CARE ADVICE given per Abdominal Pain - Female (Adult) guideline. Referrals REFERRED TO PCP OFFIC

## 2021-07-08 NOTE — Discharge Instructions (Signed)
You have a ventral hernia that contains fat but no bowel that we were not able to clearly palpate on exam.  Because it is small and does not contain bowel you can follow-up as an outpatient with general surgery on Monday.  I have prescribed you oxycodone for breakthrough pain you can continue to take the ibuprofen.  Return to the emergency department for fever intractable pain vomiting or inability to have a bowel movement.

## 2021-07-08 NOTE — Discharge Instructions (Addendum)
Go to outpatient imaging at [facility] now for CT abdomen pelvis  You may return home or to work after imaging and you will be called with results and at that time we will discuss any necessary treatment  and how to move forward

## 2021-07-08 NOTE — ED Triage Notes (Signed)
Pt has been feeling a knot on her ovaries intermittently for 2 years. Over the past few days, pain has been constant for 4 days. Pt had CT today that showed hernia.

## 2021-07-08 NOTE — ED Provider Notes (Addendum)
[facility]    CSN: [id number] Arrival date & time: 07/08/21  1456      History   Chief Complaint Chief Complaint  Patient presents with   Abdominal Pain    RLQ    HPI NOAMI BOVE is a 41 y.o. female.   Patient presents with intermittent sharp aching right lower quadrant abdominal pain beginning 3 days ago.  Endorses that symptoms have been occurring intermittently since childbirth 3 years ago but just became persistent.  Feels that there is a bulging sensation to the right lower abdomen.  Symptoms are worsened with sitting, bending.  Associated nausea when pain is severe .  Has attempted use of ibuprofen once which was ineffective.  History of an ovarian cyst which resolved.  Denies abdominal distention, increased gas production, heartburn or indigestion, vomiting, diarrhea or constipation, fever, chills, URI symptoms, injury or trauma, urinary or vaginal symptoms.  Past Medical History:  Diagnosis Date   Depression    Genital herpes    GERD (gastroesophageal reflux disease)    Hypertension    Migraine    Obesity affecting pregnancy     Patient Active Problem List   Diagnosis Date Noted   HSV-2 (herpes simplex virus 2) infection 02/28/2021   Nasal congestion 10/12/2020   Acute otitis media 10/12/2020   Numbness and tingling in both hands 06/24/2020   BMI 40.0-44.9, adult (HCC) 05/22/2018   Moderate recurrent major depression (HCC) 02/08/2017   Migraine aura, persistent 10/10/2012   GENITAL HERPES 10/23/2008   COMMON MIGRAINE 10/23/2008   Essential hypertension 10/23/2008   GERD 10/23/2008    Past Surgical History:  Procedure Laterality Date   CESAREAN SECTION N/A 12/16/2015   Procedure: CESAREAN SECTION;  Surgeon: Conard Novak, MD;  Location: [facility] ORS;  Service: Obstetrics;  Laterality: N/A;   CESAREAN SECTION WITH BILATERAL TUBAL LIGATION  12/02/2018   Procedure: CESAREAN SECTION WITH BILATERAL TUBAL LIGATION;  Surgeon: Natale Milch, MD;  Location: [facility] ORS;  Service: Obstetrics;;  TOB 1850 WEIGHT 7lb 5oz LENGTH 19.75 APGAR 9/9   DILATION AND CURETTAGE OF UTERUS  01/2013    OB History     Gravida  4   Para  2   Term  2   Preterm  0   AB  2   Living  2      SAB      IAB      Ectopic      Multiple  0   Live Births  2            Home Medications    Prior to Admission medications   Medication Sig Start Date End Date Taking? Authorizing Provider  buPROPion (WELLBUTRIN SR) 150 MG 12 hr tablet Take 1 tablet (150 mg total) by mouth 2 (two) times daily. 02/01/21   Copland, Karleen Hampshire, MD  cefdinir (OMNICEF) 300 MG capsule Take 1 capsule (300 mg total) by mouth 2 (two) times daily. 06/16/21   Copland, Karleen Hampshire, MD  escitalopram (LEXAPRO) 20 MG tablet TAKE 1 TABLET BY MOUTH DAILY. 11/12/20 11/12/21  Schuman, Jaquelyn Bitter, MD  gabapentin (NEURONTIN) 100 MG capsule Follow titration until 3 capsules three times a day is reached. 07/19/20   Copland, Karleen Hampshire, MD  ipratropium (ATROVENT) 0.06 % nasal spray Place 2 sprays into both nostrils 4 (four) times daily. 06/04/21   Becky Augusta, NP  labetalol (NORMODYNE) 100 MG tablet TAKE 1 TABLET BY MOUTH 2 TIMES DAILY. 02/01/21 02/01/22  [name], MD  Multiple Vitamin (MULTIVITAMIN) tablet Take 1 tablet by mouth daily.    [provider]  NON FORMULARY Over the counter medicine for bladder, "makes your urine orange"    [provider]  omeprazole (PRILOSEC) 40 MG capsule Take 1 capsule (40 mg total) by mouth daily. 06/30/21   Copland, Karleen Hampshire, MD  predniSONE (DELTASONE) 20 MG tablet 2 tabs po for 4 days, then 1 tab po for 4 days 06/16/21   Copland, Karleen Hampshire, MD  valACYclovir (VALTREX) 1000 MG tablet Take 1 tablet (1,000 mg total) by mouth 2 (two) times daily. 02/25/21 10/05/21  Mort Sawyers, FNP    Family History Family History  Problem Relation Age of Onset   Hypertension Mother    Hypertension Father    Hypertension Brother    Cancer Brother  51   Hypertension Maternal Grandmother    Hypertension Maternal Grandfather    Hypertension Paternal Grandmother    Hypertension Paternal Grandfather     Social History Social History   Tobacco Use   Smoking status: Never   Smokeless tobacco: Never  Vaping Use   Vaping Use: Never used  Substance Use Topics   Alcohol use: Yes    Alcohol/week: 0.0 standard drinks of alcohol    Comment: occ   Drug use: No     Allergies   Pseudoeph-doxylamine-dm-apap   Review of Systems Review of Systems  Constitutional: Negative.   Respiratory: Negative.    Cardiovascular: Negative.   Gastrointestinal:  Positive for abdominal pain and nausea. Negative for abdominal distention, anal bleeding, blood in stool, constipation, diarrhea, rectal pain and vomiting.  Skin: Negative.   Neurological: Negative.      Physical Exam Triage Vital Signs ED Triage Vitals  Enc Vitals Group     BP 07/08/21 1507 (!) 159/107     Pulse Rate 07/08/21 1507 91     Resp 07/08/21 1507 14     Temp 07/08/21 1507 98.2 F (36.8 C)     Temp Source 07/08/21 1507 Oral     SpO2 07/08/21 1507 98 %     Weight 07/08/21 1506 235 lb (106.6 kg)     Height 07/08/21 1506 4\' 11"  (1.499 m)     Head Circumference --      Peak Flow --      Pain Score 07/08/21 1505 5     Pain Loc --      Pain Edu? --      Excl. in GC? --    No data found.  Updated Vital Signs BP (!) 159/107 (BP Location: Right Arm)   Pulse 91   Temp 98.2 F (36.8 C) (Oral)   Resp 14   Ht 4\' 11"  (1.499 m)   Wt 235 lb (106.6 kg)   LMP 06/23/2021 (Approximate)   SpO2 98%   BMI 47.46 kg/m   Visual Acuity Right Eye Distance:   Left Eye Distance:   Bilateral Distance:    Right Eye Near:   Left Eye Near:    Bilateral Near:     Physical Exam Constitutional:      Appearance: Normal appearance. She is well-developed.  HENT:     Head: Normocephalic.  Pulmonary:     Effort: Pulmonary effort is normal.  Abdominal:     Tenderness: There is  abdominal tenderness in the right lower quadrant.     Comments: Slight bulging noted to the right lower quadrant  Skin:    General: Skin is warm and dry.  Neurological:  General: No focal deficit present.     Mental Status: She is alert and oriented to person, place, and time.  Psychiatric:        Mood and Affect: Mood normal.        Behavior: Behavior normal.      UC Treatments / Results  Labs (all labs ordered are listed, but only abnormal results are displayed) Labs Reviewed - No data to display  EKG   Radiology No results found.  Procedures Procedures (including critical care time)  Medications Ordered in UC Medications - No data to display  Initial Impression / Assessment and Plan / UC Course  I have reviewed the triage vital signs and the nursing notes.  Pertinent labs & imaging results that were available during my care of the patient were reviewed by me and considered in my medical decision making (see chart for details).  Right lower quadrant abdominal pain  Vital signs are stable and patient is in no signs of distress, tenderness is noted to the right lower quadrant with palpation and severe pain is elicited when patient asked to cough to assess for herniations, CT abdomen pelvis pending Final Clinical Impressions(s) / UC Diagnoses   Final diagnoses:  None   Discharge Instructions   None    ED Prescriptions   None    PDMP not reviewed this encounter.   Valinda Hoar, NP 07/08/21 1541    Valinda Hoar, NP 07/08/21 1542

## 2021-07-08 NOTE — Telephone Encounter (Signed)
I agree with Lacey Rodgers that 8/10 abdominal pain should be evaluated, and ultimately she can make decisions regarding evaluation and treatment.

## 2021-07-08 NOTE — ED Provider Notes (Signed)
[facility] Provider Note    Event Date/Time   First MD Initiated Contact with Patient 07/08/21 2004     (approximate)   History   Hernia   HPI  Lacey Rodgers is a 41 y.o. female past medical history of depression herpes GERD hypertension who presents with abdominal pain.  Patient has had intermittent colicky right lower quadrant abdominal pain for the past 3 days.  Comes and goes associated nausea but no vomiting no constipation no fevers chills or urinary symptoms.  Says she has had similar pain about a year ago that resolved.  Has had 2 C-sections no prior abdominal surgeries other than this.  She was seen at urgent care had an outpatient CT that showed possible incarcerated ventral wall hernia.     Past Medical History:  Diagnosis Date   Depression    Genital herpes    GERD (gastroesophageal reflux disease)    Hypertension    Migraine    Obesity affecting pregnancy     Patient Active Problem List   Diagnosis Date Noted   HSV-2 (herpes simplex virus 2) infection 02/28/2021   Nasal congestion 10/12/2020   Acute otitis media 10/12/2020   Numbness and tingling in both hands 06/24/2020   BMI 40.0-44.9, adult (HCC) 05/22/2018   Moderate recurrent major depression (HCC) 02/08/2017   Migraine aura, persistent 10/10/2012   GENITAL HERPES 10/23/2008   COMMON MIGRAINE 10/23/2008   Essential hypertension 10/23/2008   GERD 10/23/2008     Physical Exam  Triage Vital Signs: ED Triage Vitals  Enc Vitals Group     BP 07/08/21 1755 (!) 175/119     Pulse Rate 07/08/21 1755 90     Resp 07/08/21 1755 16     Temp 07/08/21 1755 98.3 F (36.8 C)     Temp Source 07/08/21 1755 Oral     SpO2 07/08/21 1755 97 %     Weight 07/08/21 1756 233 lb 11 oz (106 kg)     Height 07/08/21 1756 4\' 11"  (1.499 m)     Head Circumference --      Peak Flow --      Pain Score 07/08/21 1756 4     Pain Loc --      Pain Edu? --      Excl. in GC? --     Most recent  vital signs: Vitals:   07/08/21 2013 07/08/21 2030  BP: (!) 176/101 (!) 154/83  Pulse: 81 80  Resp: 18   Temp:    SpO2: 100% 99%     General: Awake, no distress.  CV:  Good peripheral perfusion.  Resp:  Normal effort.  Abd:  No distention.  Abdomen is obese, tender to palpation in the right lower quadrant and suprapubic region, will to palpate hernia Neuro:             Awake, Alert, Oriented x 3  Other:     ED Results / Procedures / Treatments  Labs (all labs ordered are listed, but only abnormal results are displayed) Labs Reviewed  COMPREHENSIVE METABOLIC PANEL - Abnormal; Notable for the following components:      Result Value   AST 14 (*)    All other components within normal limits  URINALYSIS, ROUTINE W REFLEX MICROSCOPIC - Abnormal; Notable for the following components:   Color, Urine YELLOW (*)    APPearance CLEAR (*)    Specific Gravity, Urine 1.004 (*)    All other components within normal limits  CBC WITH DIFFERENTIAL/PLATELET  POC URINE PREG, ED     EKG     RADIOLOGY I reviewed interpreted the CT imaging done as an outpatient showing the ventral wall hernia with fat   PROCEDURES:  Critical Care performed: No  Procedures  The patient is on the cardiac monitor to evaluate for evidence of arrhythmia and/or significant heart rate changes.   MEDICATIONS ORDERED IN ED: Medications  ketorolac (TORADOL) 15 MG/ML injection 15 mg (has no administration in time range)  ondansetron (ZOFRAN-ODT) disintegrating tablet 4 mg (4 mg Oral Given 07/08/21 1818)  traMADol (ULTRAM) tablet 50 mg (50 mg Oral Given 07/08/21 1818)     IMPRESSION / MDM / ASSESSMENT AND PLAN / ED COURSE  I reviewed the triage vital signs and the nursing notes.                              Patient's presentation is most consistent with acute presentation with potential threat to life or bodily function.  Differential diagnosis includes, but is not limited to, reducible ventral hernia  incarcerated hernia, strangulated hernia, bowel obstruction  Patient is a 29-year-old female presenting with right lower quadrant pain with CT imaging done as an outpatient showing ventral wall hernia.  I reviewed the CT which does demonstrate a small fat-containing hernia with 1.5 cm aperture that does not came pain bowel.  Patient has had intermittent pain for the last 3 days.  When she points the location of her pain this does correlate with the location of the hernia on CT however unfortunately I am not able to actually palpate the hernia due to patient's body habitus.  She is mildly tender in this area but overall the exam is benign.  She is not in distress when I am not palpating her abdomen.  Labs are reassuring no leukocytosis hCG is negative.  I discussed with Dr. Maia Plan general surgery on-call about my inability to locate the hernia thus unable to tell whether it is truly reducible or not.  He reviewed the imaging and based on her clinical status and the imaging did not feel that she needed any urgent operative intervention or in person surgical consult.  Recommended outpatient follow-up.  Will discharge with ibuprofen and Percocet for breakthrough pain.  We discussed return precautions she will follow-up with Dr. Maia Plan on Monday.       FINAL CLINICAL IMPRESSION(S) / ED DIAGNOSES   Final diagnoses:  Ventral hernia without obstruction or gangrene     Rx / DC Orders   ED Discharge Orders          Ordered    oxyCODONE (ROXICODONE) 5 MG immediate release tablet  Every 8 hours PRN        07/08/21 2121             Note:  This document was prepared using Dragon voice recognition software and may include unintentional dictation errors.   Georga Hacking, MD 07/08/21 2121

## 2021-07-08 NOTE — Telephone Encounter (Signed)
They called back wanted her seen within 24 hours, the only place that had anything was the [facility] office, she didn't want to drive that far and said she will go to an urgent care

## 2021-07-08 NOTE — Telephone Encounter (Signed)
Patient called in trying to schedule an appt, She said she is having severe pain in lower right side of abdomen. I transferred to the triage nurse for further eval

## 2021-07-08 NOTE — ED Provider Triage Note (Signed)
Emergency Medicine Provider Triage Evaluation Note  Lacey Rodgers, a 41 y.o. female  was evaluated in triage.  Pt complains of RLQ pain. She would report three days of intermittently sharps with waves of nausea. She assumed symptoms were due to kidney infection or ovarian cyst. She has noted a sense of a bulge in the past to the RLQ and again over today while at work. She has had nausea w/o vomiting but denies bowel changes, FCS. Patient presents after outpatient CT reveal fat-contained ventral hernia with concern for fat necrosis/stranding.  Review of Systems  Positive: RLQ Negative: FCS  Physical Exam  BP (!) 175/119   Pulse 90   Temp 98.3 F (36.8 C) (Oral)   Resp 16   Ht 4\' 11"  (1.499 m)   Wt 106 kg   LMP 06/23/2021 (Approximate)   SpO2 97%   BMI 47.20 kg/m  Gen:   Awake, no distress   Resp:  Normal effort  MSK:   Moves extremities without difficulty  Other:    Medical Decision Making  Medically screening exam initiated at 6:12 PM.  Appropriate orders placed.  [name] was informed that the remainder of the evaluation will be completed by another provider, this initial triage assessment does not replace that evaluation, and the importance of remaining in the ED until their evaluation is complete.  Patient to the ED for evaluation of ventral hernia with concern for fat necrosis/stranding.    Suezanne Cheshire, PA-C 07/08/21 1815

## 2021-07-10 ENCOUNTER — Other Ambulatory Visit: Payer: Self-pay

## 2021-07-13 ENCOUNTER — Other Ambulatory Visit: Payer: Self-pay | Admitting: General Surgery

## 2021-07-13 NOTE — Progress Notes (Signed)
Progress Notes - documented in this encounter Trease Bremner, Albina Billet, MD - 07/12/2021 4:00 PM EDT Formatting of this note is different from the original. Images from the original note were not included. Subjective:   Patient ID: Lacey Rodgers is a 41 y.o. female.  HPI  The following portions of the patient's history were reviewed and updated as appropriate.  This a new patient is here today for: office visit. Here for evaluation of a ventral hernia. She was seen in the ED on 07-08-21 for abdominal pain. The pain is on he right side near C Section scar. She states the pain has come and gone for about 2 years but this episode lasted longer. She does admit to having a knot which comes and goes intermittently noted during the same time.  For C-section was emergent, the second elective. No difficulty with wound healing.  Review of Systems  Constitutional: Negative for chills and fever.  Respiratory: Negative for cough.  Gastrointestinal: Positive for abdominal pain and constipation.   Chief Complaint  Patient presents with  Hernia    BP (!) 140/76  Pulse 90  Temp 36.5 C (97.7 F)  Ht 149.9 cm (4\' 11" )  Wt (!) 108.9 kg (240 lb)  SpO2 97%  BMI 48.47 kg/m   Past Medical History:  Diagnosis Date  Essential (primary) hypertension 10/23/2008  Overview: Qualifier: Diagnosis of By: Copland MD, Spencer  Gastro-esophageal reflux disease without esophagitis 10/23/2008  Overview: Qualifier: Diagnosis of By: Copland MD, Spencer  Herpesviral infection of urogenital system 10/23/2008  Overview: Qualifier: Diagnosis of By: Copland MD, 10/25/2008  Major depressive disorder, single episode 10/23/2008  Overview: Qualifier: Diagnosis of By: Copland MD, Spencer  Migraine without aura and without status migrainosus, not intractable 10/23/2008  Overview: Qualifier: Diagnosis of By: Copland MD, 10/25/2008    Past Surgical History:  Procedure Laterality Date  dialation and curettage of  uterus 01/2013  CESAREAN SECTION 12/16/2015  CESAREAN SECTION 12/02/2018    OB History   Gravida  2  Para  2  Term   Preterm   AB   Living     SAB   IAB   Ectopic   Molar   Multiple   Live Births     Obstetric Comments  Age at first period 75 Age of first pregnancy 22      Social History   Socioeconomic History  Marital status: Married  Tobacco Use  Smoking status: Former  Packs/day: 0.25  Years: 6.00  Pack years: 1.50  Types: Cigarettes  Smokeless tobacco: Never  Substance and Sexual Activity  Alcohol use: Yes  Comment: occasionally  Drug use: Never    Allergies  Allergen Reactions  Vicks Nyquil [Doxylamin-Pse-Dm-Acetaminophen] Hives   Current Outpatient Medications  Medication Sig Dispense Refill  aspirin-acetaminophen-caffeine (EXCEDRIN MIGRAINE) 250-250-65 mg per tablet Take by mouth.  escitalopram oxalate (LEXAPRO) 20 MG tablet Take by mouth  labetaloL (TRANDATE) 100 MG tablet Take by mouth  loratadine (CLARITIN ORAL) Take by mouth  omeprazole (PRILOSEC) 20 MG DR capsule Take by mouth.  valACYclovir (VALTREX) 1000 MG tablet Take by mouth as needed  ascorbic acid, vitamin C, (VITAMIN C) 500 MG tablet Take 500 mg by mouth once daily. (Patient not taking: Reported on 04/23/2019)  codeine-guaifenesin 10-100 mg/5 mL oral liquid Take 5 mLs by mouth every 4 (four) hours as needed (Patient not taking: Reported on 07/12/2021) 120 mL 0  etonogestrel (NEXPLANON) 68 mg implant Inject subcutaneously (Patient not taking: Reported on 04/23/2019)  fluticasone propionate (FLONASE)  50 mcg/actuation nasal spray Place 1 spray into both nostrils 2 (two) times daily (Patient not taking: Reported on 06/06/2017) 16 g 0  FUROsemide (LASIX) 20 MG tablet Take 1 tablet (20 mg total) by mouth once daily (Patient not taking: Reported on 07/12/2021) 3 tablet 0  hydroCHLOROthiazide (MICROZIDE) 12.5 mg capsule Take by mouth (Patient not taking: Reported on 04/23/2019)   magnesium oxide (MAG-OX) 400 mg tablet Take by mouth. (Patient not taking: Reported on 04/23/2019)   No current facility-administered medications for this visit.   Family History  Problem Relation Age of Onset  Cancer Mother 84  breast  High blood pressure (Hypertension) Mother  High blood pressure (Hypertension) Father  Cancer Brother 73  testicular  High blood pressure (Hypertension) Brother  High blood pressure (Hypertension) Maternal Grandmother  High blood pressure (Hypertension) Maternal Grandfather  High blood pressure (Hypertension) Paternal Grandmother  High blood pressure (Hypertension) Paternal Grandfather   Labs and Radiology:   Laboratory review July 08, 2021:  WBC 4.0 - 10.5 K/uL 6.2  RBC 3.87 - 5.11 MIL/uL 5.00  Hemoglobin 12.0 - 15.0 g/dL 85.4  HCT 62.7 - 03.5 % 44.3  MCV 80.0 - 100.0 fL 88.6  MCH 26.0 - 34.0 pg 29.0  MCHC 30.0 - 36.0 g/dL 00.9  RDW 38.1 - 82.9 % 12.7  Platelets 150 - 400 K/uL 272  nRBC 0.0 - 0.2 % 0.0  Neutrophils Relative % % 55  Neutro Abs 1.7 - 7.7 K/uL 3.5  Lymphocytes Relative % 36  Lymphs Abs 0.7 - 4.0 K/uL 2.2  Monocytes Relative % 7  Monocytes Absolute 0.1 - 1.0 K/uL 0.4  Eosinophils Relative % 2  Eosinophils Absolute 0.0 - 0.5 K/uL 0.1  Basophils Relative % 0  Basophils Absolute 0.0 - 0.1 K/uL 0.0  Immature Granulocytes % 0  Abs Immature Granulocytes 0.00 - 0.07 K/uL 0.01   Sodium 135 - 145 mmol/L 139  Potassium 3.5 - 5.1 mmol/L 3.7  Chloride 98 - 111 mmol/L 108  CO2 22 - 32 mmol/L 22  Glucose, Bld 70 - 99 mg/dL 95  Comment: Glucose reference range applies only to samples taken after fasting for at least 8 hours. BUN 6 - 20 mg/dL 16  Creatinine, Ser 9.37 - 1.00 mg/dL 1.69  Calcium 8.9 - 67.8 mg/dL 9.4  Total Protein 6.5 - 8.1 g/dL 7.0  Albumin 3.5 - 5.0 g/dL 4.2  AST 15 - 41 U/L 14 Low  ALT 0 - 44 U/L 17  Alkaline Phosphatase 38 - 126 U/L 45   CT of the abdomen pelvis July 08, 2021:  Other: There is a fat  containing lower abdominal ventral hernia with a 1.5 cm aperture. There is stranding within the herniated fat. No bowel containing hernia. No ascites. No free air.  Musculoskeletal: No acute osseous abnormality. No suspicious osseous lesion. Multilevel degenerative changes of the spine.  IMPRESSION: Fat containing lower abdominal ventral hernia with a 1.5 cm aperture. There is stranding within the herniated fat suggesting fat ischemia. Correlate with reducibility. No bowel containing hernia.  Bilateral nonobstructive nephrolithiasis.  Normal appendix.  July 12, 2021 abdominal ultrasound:  Ultrasound examination of the abdomen was undertaken to confirm location of the above noted ventral hernia.  To the right of the midline 1 cm, along the C-section scar there is a 1.3 x 2.1 cm fascial defect corresponding to the CT abnormality containing fat. This is not completely reducible.  From the December 02, 2018 C-section report:  After induction of anesthesia, the patient  was draped and prepped in the usual sterile manner. A Pfannenstiel incision was made and carried down through the subcutaneous tissue to the fascia. Fascial incision was made and extended transversely with the Mayo scissors. The fascia was separated from the underlying rectus tissue superiorly and inferiorly. ... The rectus fascia was then reapproximated with running sutures of 0-vicryl, with careful placement not to incorporate the cathaters  Objective:  Physical Exam Exam conducted with a chaperone present.  Constitutional:  Appearance: Normal appearance.  Cardiovascular:  Rate and Rhythm: Normal rate and regular rhythm.  Pulses: Normal pulses.  Heart sounds: Normal heart sounds.  Pulmonary:  Effort: Pulmonary effort is normal.  Breath sounds: Normal breath sounds.  Abdominal:  General: Bowel sounds are normal.  Palpations: Abdomen is soft.  Hernia: A hernia is present. Hernia is present in the ventral area.    Comments: 3 cm incompletely reducible fat through a small fascial defect just to the right of the midline along the C-section scar. Mild local tenderness. No abdominal tenderness.  Musculoskeletal:  Cervical back: Neck supple.  Skin: General: Skin is warm and dry.  Neurological:  Mental Status: She is alert and oriented to person, place, and time.  Psychiatric:  Mood and Affect: Mood normal.  Behavior: Behavior normal.    Assessment:   Ventral hernia along old C-section scar. Incarcerated fat.  Plan:   Indications for elective repair were removed. While the fascial defect is likely less than 3 cm based on the patient's body habitus and activity level she may benefit from intraoperative mesh placement for reinforcement. Risks and benefits associated with mesh were discussed. 1% increased risk of infection, significant decrease risk of recurrence.  The patient is amenable to proceed with surgery and this is scheduled for July 18, 2021.   This note is partially prepared by Dorathy Daft, RN, acting as a scribe in the presence of Dr. Donnalee Curry, MD.  The documentation recorded by the scribe accurately reflects the service I personally performed and the decisions made by me.   Earline Mayotte, MD FACS

## 2021-07-14 ENCOUNTER — Encounter
Admission: RE | Admit: 2021-07-14 | Discharge: 2021-07-14 | Disposition: A | Discharge status: Home / Self Care | Payer: No Typology Code available for payment source | Source: Ambulatory Visit | Attending: General Surgery | Admitting: General Surgery

## 2021-07-14 DIAGNOSIS — Z01818 Encounter for other preprocedural examination: Secondary | ICD-10-CM

## 2021-07-14 DIAGNOSIS — I10 Essential (primary) hypertension: Secondary | ICD-10-CM

## 2021-07-14 HISTORY — DX: Personal history of urinary calculi: Z87.442

## 2021-07-14 HISTORY — DX: Family history of other specified conditions: Z84.89

## 2021-07-14 HISTORY — DX: Otitis media, unspecified, unspecified ear: H66.90

## 2021-07-14 NOTE — Patient Instructions (Signed)
Your procedure is scheduled on:07-18-21 Monday Report to the Registration Desk on the 1st floor of the [facility].Then proceed to the 2nd floor Surgery Desk To find out your arrival time, please call [phone] between 1PM - 3PM on:07-15-21 Friday If your arrival time is 6:00 am, do not arrive prior to that time as the [facility] entrance doors do not open until 6:00 am.  REMEMBER: Instructions that are not followed completely may result in serious medical risk, up to and including death; or upon the discretion of your surgeon and anesthesiologist your surgery may need to be rescheduled.  Do not eat food after midnight the night before surgery.  No gum chewing, lozengers or hard candies.  You may however, drink CLEAR liquids up to 2 hours before you are scheduled to arrive for your surgery. Do not drink anything within 2 hours of your scheduled arrival time.  Clear liquids include: - water  - apple juice without pulp - gatorade (not RED colors) - black coffee or tea (Do NOT add milk or creamers to the coffee or tea) Do NOT drink anything that is not on this list.  TAKE THESE MEDICATIONS THE MORNING OF SURGERY WITH A SIP OF WATER: -labetalol (NORMODYNE) -omeprazole (PRILOSEC)  One week prior to surgery: Stop Anti-inflammatories (NSAIDS) such as Advil, Aleve, Ibuprofen, Motrin, Naproxen, Naprosyn and Aspirin based products such as Excedrin, Goodys Powder, BC Powder.You may however, take Tylenol if needed for pain up until the day of surgery.  Stop ANY OVER THE COUNTER supplements/vitamins NOW (07-14-21) until after surgery (Multivitamin)  No Alcohol for 24 hours before or after surgery.  No Smoking including e-cigarettes for 24 hours prior to surgery.  No chewable tobacco products for at least 6 hours prior to surgery.  No nicotine patches on the day of surgery.  Do not use any "recreational" drugs for at least a week prior to your surgery.  Please be advised that the  combination of cocaine and anesthesia may have negative outcomes, up to and including death. If you test positive for cocaine, your surgery will be cancelled.  On the morning of surgery brush your teeth with toothpaste and water, you may rinse your mouth with mouthwash if you wish. Do not swallow any toothpaste or mouthwash.  Use CHG Soap as directed on instruction sheet.  Do not wear jewelry, make-up, hairpins, clips or nail polish.  Do not wear lotions, powders, or perfumes.   Do not shave body from the neck down 48 hours prior to surgery just in case you cut yourself which could leave a site for infection.  Also, freshly shaved skin may become irritated if using the CHG soap.  Contact lenses, hearing aids and dentures may not be worn into surgery.  Do not bring valuables to the hospital. [facility] is not responsible for any missing/lost belongings or valuables.   Notify your doctor if there is any change in your medical condition (cold, fever, infection).  Wear comfortable clothing (specific to your surgery type) to the hospital.  After surgery, you can help prevent lung complications by doing breathing exercises.  Take deep breaths and cough every 1-2 hours. Your doctor may order a device called an Incentive Spirometer to help you take deep breaths. When coughing or sneezing, hold a pillow firmly against your incision with both hands. This is called "splinting." Doing this helps protect your incision. It also decreases belly discomfort.  If you are being admitted to the hospital overnight, leave your suitcase  in the car. After surgery it may be brought to your room.  If you are being discharged the day of surgery, you will not be allowed to drive home. You will need a responsible adult (18 years or older) to drive you home and stay with you that night.   If you are taking public transportation, you will need to have a responsible adult (18 years or older) with you. Please  confirm with your physician that it is acceptable to use public transportation.   Please call the Pre-admissions Testing Dept. at [phone] if you have any questions about these instructions.  Surgery Visitation Policy:  Patients undergoing a surgery or procedure may have two family members or support persons with them as long as the person is not COVID-19 positive or experiencing its symptoms.

## 2021-07-15 ENCOUNTER — Encounter
Admission: RE | Admit: 2021-07-15 | Discharge: 2021-07-15 | Disposition: A | Discharge status: Home / Self Care | Payer: No Typology Code available for payment source | Source: Ambulatory Visit | Attending: General Surgery | Admitting: General Surgery

## 2021-07-15 DIAGNOSIS — I10 Essential (primary) hypertension: Secondary | ICD-10-CM | POA: Diagnosis not present

## 2021-07-15 DIAGNOSIS — Z01818 Encounter for other preprocedural examination: Secondary | ICD-10-CM

## 2021-07-15 DIAGNOSIS — Z01810 Encounter for preprocedural cardiovascular examination: Secondary | ICD-10-CM | POA: Diagnosis present

## 2021-07-18 ENCOUNTER — Ambulatory Visit
Admission: RE | Admit: 2021-07-18 | Discharge: 2021-07-18 | Disposition: A | Discharge status: Home / Self Care | Payer: No Typology Code available for payment source | Attending: General Surgery | Admitting: General Surgery

## 2021-07-18 ENCOUNTER — Other Ambulatory Visit: Payer: Self-pay

## 2021-07-18 ENCOUNTER — Encounter: Payer: Self-pay | Admitting: General Surgery

## 2021-07-18 ENCOUNTER — Ambulatory Visit: Payer: No Typology Code available for payment source | Admitting: Urgent Care

## 2021-07-18 ENCOUNTER — Ambulatory Visit: Payer: No Typology Code available for payment source | Admitting: Registered Nurse

## 2021-07-18 ENCOUNTER — Encounter
Admission: RE | Disposition: A | Discharge status: Home / Self Care | Payer: Self-pay | Source: Home / Self Care | Attending: General Surgery

## 2021-07-18 DIAGNOSIS — I10 Essential (primary) hypertension: Secondary | ICD-10-CM | POA: Insufficient documentation

## 2021-07-18 DIAGNOSIS — K436 Other and unspecified ventral hernia with obstruction, without gangrene: Secondary | ICD-10-CM | POA: Diagnosis present

## 2021-07-18 DIAGNOSIS — Z87891 Personal history of nicotine dependence: Secondary | ICD-10-CM | POA: Insufficient documentation

## 2021-07-18 DIAGNOSIS — Z6841 Body Mass Index (BMI) 40.0 and over, adult: Secondary | ICD-10-CM | POA: Diagnosis not present

## 2021-07-18 DIAGNOSIS — K219 Gastro-esophageal reflux disease without esophagitis: Secondary | ICD-10-CM | POA: Diagnosis not present

## 2021-07-18 DIAGNOSIS — Z01818 Encounter for other preprocedural examination: Secondary | ICD-10-CM

## 2021-07-18 DIAGNOSIS — F32A Depression, unspecified: Secondary | ICD-10-CM | POA: Insufficient documentation

## 2021-07-18 HISTORY — PX: VENTRAL HERNIA REPAIR: SHX424

## 2021-07-18 HISTORY — PX: INSERTION OF MESH: SHX5868

## 2021-07-18 LAB — POCT PREGNANCY, URINE: Preg Test, Ur: NEGATIVE

## 2021-07-18 SURGERY — REPAIR, HERNIA, VENTRAL
Anesthesia: General

## 2021-07-18 MED ORDER — LACTATED RINGERS IV SOLN: INTRAVENOUS | Status: DC

## 2021-07-18 MED ORDER — BUPIVACAINE-EPINEPHRINE (PF) 0.5% -1:200000 IJ SOLN
INTRAMUSCULAR | Status: DC | PRN
  Administered 2021-07-18: 30 mL

## 2021-07-18 MED ORDER — DIPHENHYDRAMINE HCL 50 MG/ML IJ SOLN
INTRAMUSCULAR | Status: AC
  Administered 2021-07-18: 12.5 mg via INTRAVENOUS
  Filled 2021-07-18: qty 1

## 2021-07-18 MED ORDER — CHLORHEXIDINE GLUCONATE CLOTH 2 % EX PADS: 6.0000 | MEDICATED_PAD | Freq: Once | CUTANEOUS | Status: DC

## 2021-07-18 MED ORDER — HYDROCODONE-ACETAMINOPHEN 5-325 MG PO TABS
1.0000 | ORAL_TABLET | ORAL | 0 refills | Status: AC | PRN
  Filled 2021-07-18: qty 12, 2d supply, fill #0

## 2021-07-18 MED ORDER — ROCURONIUM BROMIDE 100 MG/10ML IV SOLN
INTRAVENOUS | Status: DC | PRN
  Administered 2021-07-18: 10 mg via INTRAVENOUS
  Administered 2021-07-18: 40 mg via INTRAVENOUS

## 2021-07-18 MED ORDER — LIDOCAINE HCL (CARDIAC) PF 100 MG/5ML IV SOSY
PREFILLED_SYRINGE | INTRAVENOUS | Status: DC | PRN
  Administered 2021-07-18: 50 mg via INTRAVENOUS

## 2021-07-18 MED ORDER — MIDAZOLAM HCL 2 MG/2ML IJ SOLN
INTRAMUSCULAR | Status: AC
  Filled 2021-07-18: qty 2

## 2021-07-18 MED ORDER — KETOROLAC TROMETHAMINE 30 MG/ML IJ SOLN
INTRAMUSCULAR | Status: DC | PRN
  Administered 2021-07-18: 30 mg via INTRAVENOUS

## 2021-07-18 MED ORDER — OXYCODONE HCL 5 MG/5ML PO SOLN: 5.0000 mg | Freq: Once | ORAL | Status: AC | PRN

## 2021-07-18 MED ORDER — FENTANYL CITRATE (PF) 100 MCG/2ML IJ SOLN
INTRAMUSCULAR | Status: AC
  Administered 2021-07-18: 25 ug via INTRAVENOUS
  Filled 2021-07-18: qty 2

## 2021-07-18 MED ORDER — DEXMEDETOMIDINE HCL IN NACL 80 MCG/20ML IV SOLN
INTRAVENOUS | Status: AC
  Filled 2021-07-18: qty 20

## 2021-07-18 MED ORDER — ONDANSETRON HCL 4 MG/2ML IJ SOLN: 4.0000 mg | Freq: Once | INTRAMUSCULAR | Status: DC | PRN

## 2021-07-18 MED ORDER — CHLORHEXIDINE GLUCONATE 0.12 % MT SOLN
OROMUCOSAL | Status: AC
  Filled 2021-07-18: qty 15

## 2021-07-18 MED ORDER — SUGAMMADEX SODIUM 500 MG/5ML IV SOLN
INTRAVENOUS | Status: DC | PRN
  Administered 2021-07-18: 220 mg via INTRAVENOUS

## 2021-07-18 MED ORDER — FENTANYL CITRATE (PF) 100 MCG/2ML IJ SOLN
INTRAMUSCULAR | Status: AC
  Filled 2021-07-18: qty 2

## 2021-07-18 MED ORDER — CHLORHEXIDINE GLUCONATE CLOTH 2 % EX PADS
6.0000 | MEDICATED_PAD | Freq: Once | CUTANEOUS | Status: AC
  Administered 2021-07-18: 6 via TOPICAL

## 2021-07-18 MED ORDER — OXYCODONE HCL 5 MG PO TABS
ORAL_TABLET | ORAL | Status: AC
  Administered 2021-07-18: 5 mg via ORAL
  Filled 2021-07-18: qty 1

## 2021-07-18 MED ORDER — ORAL CARE MOUTH RINSE
15.0000 mL | Freq: Once | OROMUCOSAL | Status: AC
  Administered 2021-07-18: 15 mL via OROMUCOSAL

## 2021-07-18 MED ORDER — DIPHENHYDRAMINE HCL 50 MG/ML IJ SOLN
12.5000 mg | Freq: Once | INTRAMUSCULAR | Status: AC
Start: 2021-07-18 — End: 2021-07-18

## 2021-07-18 MED ORDER — FENTANYL CITRATE (PF) 100 MCG/2ML IJ SOLN
INTRAMUSCULAR | Status: DC | PRN
  Administered 2021-07-18 (×2): 50 ug via INTRAVENOUS

## 2021-07-18 MED ORDER — ACETAMINOPHEN 10 MG/ML IV SOLN
INTRAVENOUS | Status: AC
  Filled 2021-07-18: qty 100

## 2021-07-18 MED ORDER — ACETAMINOPHEN 10 MG/ML IV SOLN
INTRAVENOUS | Status: DC | PRN
  Administered 2021-07-18: 1000 mg via INTRAVENOUS

## 2021-07-18 MED ORDER — SUGAMMADEX SODIUM 500 MG/5ML IV SOLN
INTRAVENOUS | Status: AC
  Filled 2021-07-18: qty 5

## 2021-07-18 MED ORDER — MIDAZOLAM HCL 2 MG/2ML IJ SOLN
INTRAMUSCULAR | Status: DC | PRN
  Administered 2021-07-18: 2 mg via INTRAVENOUS

## 2021-07-18 MED ORDER — LIDOCAINE HCL (PF) 2 % IJ SOLN
INTRAMUSCULAR | Status: AC
  Filled 2021-07-18: qty 5

## 2021-07-18 MED ORDER — FENTANYL CITRATE (PF) 100 MCG/2ML IJ SOLN
25.0000 ug | INTRAMUSCULAR | Status: AC | PRN
  Administered 2021-07-18 (×4): 25 ug via INTRAVENOUS

## 2021-07-18 MED ORDER — CEFAZOLIN SODIUM-DEXTROSE 2-4 GM/100ML-% IV SOLN
INTRAVENOUS | Status: AC
  Filled 2021-07-18: qty 100

## 2021-07-18 MED ORDER — DEXMEDETOMIDINE HCL IN NACL 200 MCG/50ML IV SOLN
INTRAVENOUS | Status: DC | PRN
  Administered 2021-07-18 (×3): 4 ug via INTRAVENOUS

## 2021-07-18 MED ORDER — DEXAMETHASONE SODIUM PHOSPHATE 10 MG/ML IJ SOLN
INTRAMUSCULAR | Status: DC | PRN
  Administered 2021-07-18: 10 mg via INTRAVENOUS

## 2021-07-18 MED ORDER — ROCURONIUM BROMIDE 10 MG/ML (PF) SYRINGE
PREFILLED_SYRINGE | INTRAVENOUS | Status: AC
Start: 2021-07-18 — End: ?
  Filled 2021-07-18: qty 10

## 2021-07-18 MED ORDER — PROPOFOL 10 MG/ML IV BOLUS
INTRAVENOUS | Status: DC | PRN
  Administered 2021-07-18: 130 mg via INTRAVENOUS

## 2021-07-18 MED ORDER — OXYCODONE HCL 5 MG PO TABS: 5.0000 mg | ORAL_TABLET | Freq: Once | ORAL | Status: AC | PRN

## 2021-07-18 MED ORDER — ONDANSETRON HCL 4 MG/2ML IJ SOLN
INTRAMUSCULAR | Status: AC
  Filled 2021-07-18: qty 2

## 2021-07-18 MED ORDER — BUPIVACAINE-EPINEPHRINE (PF) 0.5% -1:200000 IJ SOLN
INTRAMUSCULAR | Status: AC
  Filled 2021-07-18: qty 30

## 2021-07-18 MED ORDER — DEXAMETHASONE SODIUM PHOSPHATE 10 MG/ML IJ SOLN
INTRAMUSCULAR | Status: AC
  Filled 2021-07-18: qty 1

## 2021-07-18 MED ORDER — PROPOFOL 10 MG/ML IV BOLUS
INTRAVENOUS | Status: AC
  Filled 2021-07-18: qty 20

## 2021-07-18 MED ORDER — CEFAZOLIN SODIUM-DEXTROSE 2-4 GM/100ML-% IV SOLN
2.0000 g | INTRAVENOUS | Status: AC
  Administered 2021-07-18: 2 g via INTRAVENOUS

## 2021-07-18 SURGICAL SUPPLY — 45 items
APL PRP STRL LF DISP 70% ISPRP (MISCELLANEOUS) ×2
APL SKNCLS STERI-STRIP NONHPOA (GAUZE/BANDAGES/DRESSINGS) ×2
BENZOIN TINCTURE PRP APPL 2/3 (GAUZE/BANDAGES/DRESSINGS) ×3 IMPLANT
BLADE SURG 15 STRL SS SAFETY (BLADE) ×3 IMPLANT
BULB RESERV EVAC DRAIN JP 100C (MISCELLANEOUS) IMPLANT
CHLORAPREP W/TINT 26 (MISCELLANEOUS) ×3 IMPLANT
DRAIN CHANNEL JP 15F RND 16 (MISCELLANEOUS) IMPLANT
DRAPE LAPAROTOMY TRNSV 106X77 (MISCELLANEOUS) ×3 IMPLANT
DRSG TEGADERM 4X4.75 (GAUZE/BANDAGES/DRESSINGS) ×5 IMPLANT
DRSG TELFA 3X8 NADH (GAUZE/BANDAGES/DRESSINGS) ×3 IMPLANT
ELECT REM PT RETURN 9FT ADLT (ELECTROSURGICAL) ×3
ELECTRODE REM PT RTRN 9FT ADLT (ELECTROSURGICAL) ×2 IMPLANT
GAUZE 4X4 16PLY ~~LOC~~+RFID DBL (SPONGE) ×2 IMPLANT
GAUZE SPONGE 4X4 12PLY STRL (GAUZE/BANDAGES/DRESSINGS) IMPLANT
GLOVE BIO SURGEON STRL SZ7.5 (GLOVE) ×4 IMPLANT
GLOVE SURG UNDER LTX SZ8 (GLOVE) ×7 IMPLANT
GOWN STRL REUS W/ TWL LRG LVL3 (GOWN DISPOSABLE) ×4 IMPLANT
GOWN STRL REUS W/TWL LRG LVL3 (GOWN DISPOSABLE) ×9
KIT TURNOVER KIT A (KITS) ×3 IMPLANT
LABEL OR SOLS (LABEL) ×3 IMPLANT
MANIFOLD NEPTUNE II (INSTRUMENTS) ×3 IMPLANT
MESH VENTRALEX ST 2.5 CRC MED (Mesh General) ×1 IMPLANT
NDL HYPO 25X1 1.5 SAFETY (NEEDLE) ×2 IMPLANT
NEEDLE HYPO 22GX1.5 SAFETY (NEEDLE) ×3 IMPLANT
NEEDLE HYPO 25X1 1.5 SAFETY (NEEDLE) ×3 IMPLANT
NS IRRIG 500ML POUR BTL (IV SOLUTION) ×3 IMPLANT
PACK BASIN MINOR ARMC (MISCELLANEOUS) ×3 IMPLANT
PAD DRESSING TELFA 3X8 NADH (GAUZE/BANDAGES/DRESSINGS) ×2 IMPLANT
SPONGE T-LAP 18X18 ~~LOC~~+RFID (SPONGE) ×2 IMPLANT
STAPLER SKIN PROX 35W (STAPLE) IMPLANT
STRIP CLOSURE SKIN 1/2X4 (GAUZE/BANDAGES/DRESSINGS) ×3 IMPLANT
SUT MAXON ABS #0 GS21 30IN (SUTURE) ×1 IMPLANT
SUT SURGILON 0 BLK (SUTURE) ×3 IMPLANT
SUT VIC AB 0 CT1 36 (SUTURE) ×3 IMPLANT
SUT VIC AB 2-0 BRD 54 (SUTURE) ×3 IMPLANT
SUT VIC AB 2-0 CT1 (SUTURE) ×1 IMPLANT
SUT VIC AB 3-0 54X BRD REEL (SUTURE) ×2 IMPLANT
SUT VIC AB 3-0 BRD 54 (SUTURE) ×3
SUT VIC AB 3-0 SH 27 (SUTURE) ×3
SUT VIC AB 3-0 SH 27X BRD (SUTURE) ×2 IMPLANT
SUT VIC AB 4-0 FS2 27 (SUTURE) ×3 IMPLANT
SYR 10ML LL (SYRINGE) ×3 IMPLANT
SYR 3ML LL SCALE MARK (SYRINGE) ×3 IMPLANT
TRAY FOLEY MTR SLVR 16FR STAT (SET/KITS/TRAYS/PACK) IMPLANT
WATER STERILE IRR 500ML POUR (IV SOLUTION) ×2 IMPLANT

## 2021-07-18 NOTE — Anesthesia Preprocedure Evaluation (Addendum)
Anesthesia Evaluation  Patient identified by MRN, date of birth, ID band Patient awake    Reviewed: Allergy & Precautions, NPO status , Patient's Chart, lab work & pertinent test results  History of Anesthesia Complications Negative for: history of anesthetic complications  Airway Mallampati: I   Neck ROM: Full    Dental no notable dental hx.    Pulmonary former smoker (quit age 41),    Pulmonary exam normal breath sounds clear to auscultation       Cardiovascular hypertension, Normal cardiovascular exam Rhythm:Regular Rate:Normal  ECG 07/15/21: normal   Neuro/Psych  Headaches, PSYCHIATRIC DISORDERS Depression    GI/Hepatic GERD  ,  Endo/Other  Class 3 obesity  Renal/GU Renal disease (nephrolithiasis)     Musculoskeletal   Abdominal   Peds  Hematology negative hematology ROS (+)   Anesthesia Other Findings   Reproductive/Obstetrics                            Anesthesia Physical Anesthesia Plan  ASA: 3  Anesthesia Plan: General   Post-op Pain Management:    Induction: Intravenous  PONV Risk Score and Plan: 3 and Ondansetron, Dexamethasone and Treatment may vary due to age or medical condition  Airway Management Planned: Oral ETT  Additional Equipment:   Intra-op Plan:   Post-operative Plan: Extubation in OR  Informed Consent: I have reviewed the patients History and Physical, chart, labs and discussed the procedure including the risks, benefits and alternatives for the proposed anesthesia with the patient or authorized representative who has indicated his/her understanding and acceptance.     Dental advisory given  Plan Discussed with: CRNA  Anesthesia Plan Comments: (Patient consented for risks of anesthesia including but not limited to:  - adverse reactions to medications - damage to eyes, teeth, lips or other oral mucosa - nerve damage due to positioning  - sore  throat or hoarseness - damage to heart, brain, nerves, lungs, other parts of body or loss of life  Informed patient about role of CRNA in peri- and intra-operative care.  Patient voiced understanding.)        Anesthesia Quick Evaluation

## 2021-07-18 NOTE — Anesthesia Procedure Notes (Signed)
Procedure Name: Intubation Date/Time: 07/18/2021 7:47 AM  Performed by: Jonna Clark, CRNAPre-anesthesia Checklist: Patient identified, Patient being monitored, Timeout performed, Emergency Drugs available and Suction available Patient Re-evaluated:Patient Re-evaluated prior to induction Oxygen Delivery Method: Circle system utilized Preoxygenation: Pre-oxygenation with 100% oxygen Induction Type: IV induction Ventilation: Mask ventilation without difficulty Laryngoscope Size: Mac and 3 Grade View: Grade I Tube type: Oral Tube size: 7.0 mm Number of attempts: 1 Placement Confirmation: ETT inserted through vocal cords under direct vision, positive ETCO2 and breath sounds checked- equal and bilateral Secured at: 20 cm Tube secured with: Tape Dental Injury: Teeth and Oropharynx as per pre-operative assessment

## 2021-07-18 NOTE — Op Note (Signed)
Preoperative diagnosis: Ventral hernia with incarcerated fat.  Postoperative diagnosis: Same.  Operative procedure: Repair of ventral hernia with 6.4 cm Ventralex ST mesh.  Operating surgeon: Donnalee Curry, MD.  Assistant: Haynes Hoehn, RNFA.  Anesthesia: General endotracheal; Marcaine 0.5% with 1: 200,000 units of epinephrine, 30 cc local.  Estimated blood loss: 5 cc.  Clinical note: This 41 year old woman had previously undergone C-section x2.  She reports that about 3 months after the last C-section she noticed an intermittent bulge in the area of the incision.  Several weeks ago she had an episode of severe pain and CT showed incarcerated fat within the area.  She is admitted at this time for planned repair.  She received Ancef prior to the procedure.  SCD stockings for DVT prevention.  The site of the bulge was marked in the preop area during leg raising maneuver.  Operative note: With the patient under adequate general endotracheal anesthesia the abdomen was cleansed with ChloraPrep and draped.  Local anesthesia was infiltrated along the anticipated surgical site as well as field block to the ilioinguinal and iliofemoral nerves.  The original Pfannenstiel incision was opened on the right side.  The skin was incised sharply remaining dissection electrocautery.  The hernia defect near the midline was exposed the hernia sac was excised and discarded.  The undersurface of the fascia was cleared circumferentially for a distance of 5 cm.  A 6.4 cm mesh was used to cover the 1.5 cm fascial defect.  This was tacked in 8 positions circumferentially with transfascial sutures of 0 Maxon under direct vision.  The rectus sheath was then closed with interrupted 0 Maxon sutures with a small piece of the strap as with a pledgeted reinforcement.  The adipose layer was closed with a interrupted 2-0 Vicryl figure-of-eight sutures with anchoring stitches to the deep fascia.  Superficial layer of fat was  closed with a running 2-0 Vicryl suture.  The skin was closed with a running 4-0 Vicryl subcuticular suture.  Benzoin and Steri-Strips followed by a honeycomb dressing was applied.  Patient tolerated procedure well and was taken to the PACU in stable condition.

## 2021-07-18 NOTE — H&P (Signed)
Lacey Rodgers [id number] 1980-02-08     HPI: Healthy 41 y/o with symptomatic ventral hernia. For repair.   Medications Prior to Admission  Medication Sig Dispense Refill Last Dose   buPROPion (WELLBUTRIN SR) 150 MG 12 hr tablet Take 1 tablet (150 mg total) by mouth 2 (two) times daily. (Patient taking differently: Take 150 mg by mouth at bedtime.) 180 tablet 1 07/17/2021   escitalopram (LEXAPRO) 20 MG tablet TAKE 1 TABLET BY MOUTH DAILY. (Patient taking differently: Take 20 mg by mouth at bedtime.) 30 tablet 6 07/17/2021   gabapentin (NEURONTIN) 100 MG capsule Follow titration until 3 capsules three times a day is reached. (Patient taking differently: 100-300 mg as needed.) 270 capsule 3 Past Month   ipratropium (ATROVENT) 0.06 % nasal spray Place 2 sprays into both nostrils 4 (four) times daily. (Patient taking differently: Place 2 sprays into both nostrils as needed.) 15 mL 12 Past Month   labetalol (NORMODYNE) 100 MG tablet TAKE 1 TABLET BY MOUTH 2 TIMES DAILY. (Patient taking differently: Take 100 mg by mouth 2 (two) times daily.) 180 tablet 1 07/18/2021 at 0350   Multiple Vitamin (MULTIVITAMIN) tablet Take 1 tablet by mouth daily.   Past Month   omeprazole (PRILOSEC) 40 MG capsule Take 1 capsule (40 mg total) by mouth daily. (Patient taking differently: Take 40 mg by mouth at bedtime.) 90 capsule 0 07/18/2021   valACYclovir (VALTREX) 1000 MG tablet Take 1 tablet (1,000 mg total) by mouth 2 (two) times daily. (Patient taking differently: Take 1,000 mg by mouth at bedtime.) 180 tablet 1 Past Week   Allergies  Allergen Reactions   Pseudoeph-Doxylamine-Dm-Apap Hives   Past Medical History:  Diagnosis Date   Acute otitis media    Depression    Family history of adverse reaction to anesthesia    dad-n/v   Genital herpes    GERD (gastroesophageal reflux disease)    History of kidney stones    currently   Hypertension    Migraine    Obesity affecting pregnancy    Past Surgical  History:  Procedure Laterality Date   CESAREAN SECTION N/A 12/16/2015   Procedure: CESAREAN SECTION;  Surgeon: Conard Novak, MD;  Location: [facility] ORS;  Service: Obstetrics;  Laterality: N/A;   CESAREAN SECTION WITH BILATERAL TUBAL LIGATION  12/02/2018   Procedure: CESAREAN SECTION WITH BILATERAL TUBAL LIGATION;  Surgeon: Natale Milch, MD;  Location: [facility] ORS;  Service: Obstetrics;;  TOB 1850 WEIGHT 7lb 5oz LENGTH 19.75 APGAR 9/9   DILATION AND CURETTAGE OF UTERUS  01/2013   Social History   Socioeconomic History   Marital status: Married    Spouse name: Not on file   Number of children: 1   Years of education: Not on file   Highest education level: Not on file  Occupational History   Not on file  Tobacco Use   Smoking status: Former    Packs/day: 0.25    Years: 6.00    Total pack years: 1.50    Types: Cigarettes   Smokeless tobacco: Never  Vaping Use   Vaping Use: Never used  Substance and Sexual Activity   Alcohol use: Yes    Alcohol/week: 0.0 standard drinks of alcohol    Comment: occ   Drug use: No   Sexual activity: Yes    Birth control/protection: Surgical    Comment: Tubal   Other Topics Concern   Not on file  Social History Narrative   Not on file   Social Determinants  of Health   Financial Resource Strain: Low Risk  (12/02/2018)   Overall Financial Resource Strain (CARDIA)    Difficulty of Paying Living Expenses: Not hard at all  Food Insecurity: No Food Insecurity (12/02/2018)   Hunger Vital Sign    Worried About Running Out of Food in the Last Year: Never true    Ran Out of Food in the Last Year: Never true  Transportation Needs: No Transportation Needs (12/02/2018)   PRAPARE - Administrator, Civil Service (Medical): No    Lack of Transportation (Non-Medical): No  Physical Activity: Sufficiently Active (12/02/2018)   Exercise Vital Sign    Days of Exercise per Week: 5 days    Minutes of Exercise per Session: 30 min   Stress: Stress Concern Present (12/02/2018)   [employer] of Occupational Health - Occupational Stress Questionnaire    Feeling of Stress : To some extent  Social Connections: Unknown (12/02/2018)   Social Connection and Isolation Panel [NHANES]    Frequency of Communication with Friends and Family: More than three times a week    Frequency of Social Gatherings with Friends and Family: More than three times a week    Attends Religious Services: Patient refused    Database administrator or Organizations: Patient refused    Attends Banker Meetings: Patient refused    Marital Status: Married  Catering manager Violence: Not on file   Social History   Social History Narrative   Not on file     ROS: Negative.     PE: HEENT: Negative. Lungs: Clear. Cardio: RR.  Assessment/Plan:  Proceed with planned ventral hernia repair.   Merrily Pew [facility] 07/18/2021

## 2021-07-18 NOTE — Transfer of Care (Signed)
Immediate Anesthesia Transfer of Care Note  Patient: Lacey Rodgers  Procedure(s) Performed: HERNIA REPAIR VENTRAL ADULT INSERTION OF MESH  Patient Location: PACU  Anesthesia Type:General  Level of Consciousness: drowsy and patient cooperative  Airway & Oxygen Therapy: Patient Spontanous Breathing and Patient connected to face mask oxygen  Post-op Assessment: Report given to RN and Post -op Vital signs reviewed and stable  Post vital signs: Reviewed and stable  Last Vitals:  Vitals Value Taken Time  BP 131/82 07/18/21 0900  Temp 36.2 C 07/18/21 0855  Pulse 80 07/18/21 0901  Resp 21 07/18/21 0901  SpO2 96 % 07/18/21 0901  Vitals shown include unvalidated device data.  Last Pain:  Vitals:   07/18/21 0627  TempSrc: Temporal  PainSc: 1          Complications: No notable events documented.

## 2021-07-18 NOTE — Anesthesia Postprocedure Evaluation (Signed)
Anesthesia Post Note  Patient: Lacey Rodgers  Procedure(s) Performed: HERNIA REPAIR VENTRAL ADULT INSERTION OF MESH  Patient location during evaluation: PACU Anesthesia Type: General Level of consciousness: awake and alert, oriented and patient cooperative Pain management: pain level controlled Vital Signs Assessment: post-procedure vital signs reviewed and stable Respiratory status: spontaneous breathing, nonlabored ventilation and respiratory function stable Cardiovascular status: blood pressure returned to baseline and stable Postop Assessment: adequate PO intake Anesthetic complications: no   No notable events documented.   Last Vitals:  Vitals:   07/18/21 0943 07/18/21 0948  BP: (!) 154/90 (!) 156/90  Pulse: 77 79  Resp: 19 18  Temp: 36.7 C 36.4 C  SpO2: 94% 96%    Last Pain:  Vitals:   07/18/21 0948  TempSrc: Oral  PainSc: 3                  Reed Breech

## 2021-07-18 NOTE — Discharge Instructions (Signed)
AMBULATORY SURGERY  ?DISCHARGE INSTRUCTIONS ? ? ?The drugs that you were given will stay in your system until tomorrow so for the next 24 hours you should not: ? ?Drive an automobile ?Make any legal decisions ?Drink any alcoholic beverage ? ? ?You may resume regular meals tomorrow.  Today it is better to start with liquids and gradually work up to solid foods. ? ?You may eat anything you prefer, but it is better to start with liquids, then soup and crackers, and gradually work up to solid foods. ? ? ?Please notify your doctor immediately if you have any unusual bleeding, trouble breathing, redness and pain at the surgery site, drainage, fever, or pain not relieved by medication. ? ? ? ?Additional Instructions: ? ? ? ?Please contact your physician with any problems or Same Day Surgery at [phone], Monday through Friday 6 am to 4 pm, or [place] [facility] [place] Main number at [phone].  ?

## 2021-07-19 ENCOUNTER — Encounter: Payer: Self-pay | Admitting: General Surgery

## 2021-08-01 ENCOUNTER — Other Ambulatory Visit: Payer: Self-pay

## 2021-08-01 MED ORDER — TRIAMCINOLONE ACETONIDE 55 MCG/ACT NA AERO: INHALATION_SPRAY | NASAL | 11 refills | Status: DC

## 2021-08-19 ENCOUNTER — Other Ambulatory Visit: Payer: Self-pay | Admitting: Family Medicine

## 2021-08-19 ENCOUNTER — Other Ambulatory Visit: Payer: Self-pay

## 2021-08-19 DIAGNOSIS — F419 Anxiety disorder, unspecified: Secondary | ICD-10-CM

## 2021-08-19 MED FILL — Escitalopram Oxalate Tab 20 MG (Base Equiv): ORAL | 30 days supply | Qty: 30 | Fill #0 | Status: AC

## 2021-08-21 ENCOUNTER — Other Ambulatory Visit: Payer: Self-pay

## 2021-08-22 ENCOUNTER — Other Ambulatory Visit: Payer: Self-pay

## 2021-09-28 ENCOUNTER — Other Ambulatory Visit: Payer: Self-pay

## 2021-09-28 ENCOUNTER — Other Ambulatory Visit: Payer: Self-pay | Admitting: Family Medicine

## 2021-09-28 ENCOUNTER — Other Ambulatory Visit: Payer: Self-pay | Admitting: Family

## 2021-09-28 DIAGNOSIS — F32A Anxiety disorder, unspecified: Secondary | ICD-10-CM

## 2021-09-28 DIAGNOSIS — B009 Herpesviral infection, unspecified: Secondary | ICD-10-CM

## 2021-09-28 MED ORDER — OMEPRAZOLE 40 MG PO CPDR
40.0000 mg | DELAYED_RELEASE_CAPSULE | Freq: Every day | ORAL | 0 refills | Status: DC
  Filled 2021-09-28: qty 90, 90d supply, fill #0

## 2021-09-28 MED ORDER — LABETALOL HCL 100 MG PO TABS
ORAL_TABLET | Freq: Two times a day (BID) | ORAL | 0 refills | Status: DC
  Filled 2021-09-28: qty 180, 90d supply, fill #0

## 2021-09-28 MED ORDER — ESCITALOPRAM OXALATE 20 MG PO TABS
ORAL_TABLET | Freq: Every day | ORAL | 0 refills | Status: DC
  Filled 2021-09-28: qty 90, 90d supply, fill #0

## 2021-09-28 NOTE — Telephone Encounter (Signed)
Patient has been scheduled for her physical, she will have her labs drawn at [facility].

## 2021-09-28 NOTE — Telephone Encounter (Signed)
Please call and schedule CPE with fasting labs prior with Dr. Lorelei Pont.  Once scheduled please send back to me to refill her medication.

## 2021-09-29 ENCOUNTER — Other Ambulatory Visit: Payer: Self-pay

## 2021-09-29 ENCOUNTER — Other Ambulatory Visit: Payer: Self-pay | Admitting: Family Medicine

## 2021-09-29 DIAGNOSIS — B009 Herpesviral infection, unspecified: Secondary | ICD-10-CM

## 2021-09-29 NOTE — Telephone Encounter (Signed)
Last office visit 06/16/21 for acute otitis media.  Last refilled 02/25/21 for #180 with 1 refill.  CPE 10/10/21.

## 2021-09-30 ENCOUNTER — Other Ambulatory Visit: Payer: Self-pay

## 2021-09-30 MED FILL — Valacyclovir HCl Tab 1 GM: ORAL | 90 days supply | Qty: 90 | Fill #0 | Status: AC

## 2021-10-04 ENCOUNTER — Other Ambulatory Visit: Payer: Self-pay | Admitting: Family

## 2021-10-04 ENCOUNTER — Other Ambulatory Visit
Admission: RE | Admit: 2021-10-04 | Discharge: 2021-10-04 | Disposition: A | Discharge status: Home / Self Care | Payer: No Typology Code available for payment source | Attending: Family | Admitting: Family

## 2021-10-04 ENCOUNTER — Ambulatory Visit: Payer: No Typology Code available for payment source | Attending: Student

## 2021-10-04 DIAGNOSIS — M542 Cervicalgia: Secondary | ICD-10-CM | POA: Insufficient documentation

## 2021-10-04 DIAGNOSIS — E7800 Pure hypercholesterolemia, unspecified: Secondary | ICD-10-CM

## 2021-10-04 DIAGNOSIS — R7303 Prediabetes: Secondary | ICD-10-CM | POA: Diagnosis present

## 2021-10-04 DIAGNOSIS — Z862 Personal history of diseases of the blood and blood-forming organs and certain disorders involving the immune mechanism: Secondary | ICD-10-CM | POA: Diagnosis present

## 2021-10-04 LAB — CBC
HCT: 42 % (ref 36.0–46.0)
Hemoglobin: 13.9 g/dL (ref 12.0–15.0)
MCH: 29.1 pg (ref 26.0–34.0)
MCHC: 33.1 g/dL (ref 30.0–36.0)
MCV: 87.9 fL (ref 80.0–100.0)
Platelets: 280 10*3/uL (ref 150–400)
RBC: 4.78 MIL/uL (ref 3.87–5.11)
RDW: 12.9 % (ref 11.5–15.5)
WBC: 7 10*3/uL (ref 4.0–10.5)
nRBC: 0 % (ref 0.0–0.2)

## 2021-10-04 LAB — BASIC METABOLIC PANEL
Anion gap: 8 (ref 5–15)
BUN: 12 mg/dL (ref 6–20)
CO2: 26 mmol/L (ref 22–32)
Calcium: 8.9 mg/dL (ref 8.9–10.3)
Chloride: 104 mmol/L (ref 98–111)
Creatinine, Ser: 0.87 mg/dL (ref 0.44–1.00)
GFR, Estimated: >60 mL/min (ref >60)
Glucose, Bld: 96 mg/dL (ref 70–99)
Potassium: 3.8 mmol/L (ref 3.5–5.1)
Sodium: 138 mmol/L (ref 135–145)

## 2021-10-04 LAB — HEMOGLOBIN A1C
Hgb A1c MFr Bld: 5.4 % (ref 4.8–5.6)
Mean Plasma Glucose: 108.28 mg/dL

## 2021-10-04 LAB — LIPID PANEL
Cholesterol: 279 mg/dL — ABNORMAL HIGH (ref 0–200)
HDL: 61 mg/dL (ref >40)
LDL Cholesterol: 187 mg/dL — ABNORMAL HIGH (ref 0–99)
Total CHOL/HDL Ratio: 4.6 RATIO
Triglycerides: 157 mg/dL — ABNORMAL HIGH (ref <150)
VLDL: 31 mg/dL (ref 0–40)

## 2021-10-04 NOTE — Therapy (Signed)
OUTPATIENT PHYSICAL THERAPY TMJ EVALUATION  Patient Name: Lacey Rodgers MRN: [id number] DOB:10-03-1980, 41 y.o., female Today's Date: 10/07/2021   PT End of Session - 10/07/21 1317     Visit Number 1    Number of Visits 17    Date for PT Re-Evaluation 11/29/21    Authorization Type eval: 10/04/21    PT Start Time 1318    PT Stop Time 1400    PT Time Calculation (min) 42 min    Activity Tolerance Patient tolerated treatment well    Behavior During Therapy WFL for tasks assessed/performed            Past Medical History:  Diagnosis Date   Acute otitis media    Depression    Family history of adverse reaction to anesthesia    dad-n/v   Genital herpes    GERD (gastroesophageal reflux disease)    History of kidney stones    currently   Hypertension    Migraine    Obesity affecting pregnancy    Past Surgical History:  Procedure Laterality Date   CESAREAN SECTION N/A 12/16/2015   Procedure: CESAREAN SECTION;  Surgeon: Conard NovakStephen D Jackson, MD;  Location: [facility] OR[facility];  Service: Obstetrics;  Laterality: N/A;   CESAREAN SECTION WITH BILATERAL TUBAL LIGATION  12/02/2018   Procedure: CESAREAN SECTION WITH BILATERAL TUBAL LIGATION;  Surgeon: Natale MilchSchuman, Christanna R, MD;  Location: [facility];  Service: Obstetrics;;  TOB 1850 WEIGHT 7lb 5oz LENGTH 19.75 APGAR 9/9   DILATION AND CURETTAGE OF UTERUS  01/2013   INSERTION OF MESH  07/18/2021   Procedure: INSERTION OF MESH;  Surgeon: Earline MayotteByrnett, Jeffrey W, MD;  Location: [facility] ORS;  Service: General;;   VENTRAL HERNIA REPAIR N/A 07/18/2021   Procedure: HERNIA REPAIR VENTRAL ADULT;  Surgeon: Earline MayotteByrnett, Jeffrey W, MD;  Location: [facility] ORS;  Service: General;  Laterality: N/A;  Sonda RumbleAdrienne Allred, RNFA to assist   Patient Active Problem List   Diagnosis Date Noted   HSV-2 (herpes simplex virus 2) infection 02/28/2021   Numbness and tingling in both hands 06/24/2020   BMI 40.0-44.9, adult (HCC) 05/22/2018   Moderate recurrent major depression (HCC)  02/08/2017   Migraine aura, persistent 10/10/2012   GENITAL HERPES 10/23/2008   COMMON MIGRAINE 10/23/2008   Essential hypertension 10/23/2008   GERD 10/23/2008   PCP: Hannah Beatopland, [place], MD  REFERRING PROVIDER: Gigi GinUhlenhake, Elizabeth, PA  REFERRING DIAGNOSIS: M26.609 (ICD-10-CM) - TMJ dysfunction  THERAPY DIAG: Cervicalgia  RATIONALE FOR EVALUATION AND TREATMENT: Rehabilitation  ONSET DATE: Unknown, ongoing for multiple years  FOLLOW UP APPT WITH PROVIDER: No, not reported   SUBJECTIVE:  Chief Complaint: [name]  Pertinent History Pt reports [name] pain for multiple years which has worsened notably over the last year. No known jaw trauma at onset. Along with the jaw pain she also complains of L ear pain and fullness. She describes the pain as constant and achy. Pt confirms grinding her teeth at night with significant clenching during the day. She notices inflammation occasionally around the [name] when it is really aggravated. She wears a night guard when sleeping and is considering getting an updated one but is concerned that it might be a large out of pocket expense. She gets regular dental examinations and had her last exam approximately 6 months ago. No recent dental procedures or orthodontics. She does not chew gum. Pt reports significant pain with end range depression, eating chewy food, and talking a lot during the day at work.  Last Dental Examination and imaging: Last exam 6 months ago, unsure of last imaging  Recent dental procedures or orthodontics: No  Pain location: [name], masseter, and ear canal Pain Severity: Present: 0-1/10, Best: 0/10, Worst: 5/10 Pain quality: constant and aching  Radiating pain: No Numbness/Tingling: No Aggravating factors: excessive talking, eating chewy  foot, opening mouth wide Easing factors: massage, rest, ibuprofen  Clicking, catching, or crepitus during chewing: Yes 24 hour pain behavior: fluctuates History of grinding teeth: Yes at night as well as frequent clenching during the day. Recent or remote jaw/face/neck trauma, injury, or pain: No Falls: Has patient fallen in last 6 months? No; History of prior physical therapy for this issue: No  Imaging: No, pt is unsure of last dental imaging but she has never had specific imaging for this issue Progression (improving, worsening, unchanged): worsening over the last year History of headaches/migraines: Yes, history of headaches and migraines Ear symptoms (tinnitus, fullness, pain): Yes, L tinnitus (sometimes right), L ear fullness, L ear pain Chest pain: No Stress/anxiety: Yes, pt endorses fatigue and stress related to serving as caregiver to her two young children. Dominant hand: right Occupational demands: Endoscopy technician Hobbies: spending time with family, camping Red flags: Positive: night sweats, Negative: personal history of cancer, chills/fever, nausea, vomiting, unrelenting pain, unexplained weight gain/loss  Precautions: None  Weight Bearing Restrictions: No  Patient Goals: Pt wants to be able to open her mouth as wide as necessary without pain;   OBJECTIVE  Mental Status Patient is oriented to person, place and time.  Recent memory is intact.  Remote memory is intact.  Attention span and concentration are intact.  Expressive speech is intact.  Patient's fund of knowledge is within normal limits for educational level.  Cranial Nerves Visual acuity and visual fields are intact  Extraocular muscles are intact  Facial sensation is intact bilaterally  Facial strength is intact bilaterally  Hearing is normal as tested by gross conversation Palate elevates midline, normal phonation  Shoulder shrug strength is intact  Tongue protrudes midline    MUSCULOSKELETAL: Tremor: None Bulk: Normal Tone: Normal Facial Symmetry: Face appears grossly symmetrical but possibly with some deviation of the jaw to the right  Posture Forward head and rounded shoulders in sitting;  Cervical Screen Centralization: Deferred Isometrics: Full and painless in all directions Passive Accessory Intervertebral Motion (PAIVM), central and unilateral (bilaterally): Normal mobility without reproduction of jaw symptoms however pt does report some cervical tenderness. Spurlings A (ipsilateral lateral flexion/axial compression): R: Negative L: Negative Spurlings B (ipsilateral lateral flexion/contralateral rotation/axial compression): R: Negative L: Negative Cervical Distraction Test: Not examined  Cervical Fexion-Rotation Test: Not examined  Hoffman Sign (cervical cord compression): R: Not examined L: Not examined   AROM  AROM (Normal range in degrees) AROM  Cervical  Flexion (50) 50  Extension (80) 38  Right lateral flexion (45) 40*  Left lateral flexion (45) 40*  Right rotation (85)   Left rotation (85)   (* = pain; Blank rows = not tested)   Dermatome/Myotome Screen N=normal  Ab=abnormal Level Dermatome R L Myotome R L Reflex R L  C2 Posterior Scalp N N Cervical Flexion/Extension C1-2 N N Jaw CN V    C3 Anterior Neck N N Cervical Sidebend C2-3 N N Biceps C5-6    C4 Top of Shoulder N N Shoulder Shrug C4 N N Brachiorad. C5-6    C5 Lateral Upper Arm N N Shoulder ABD C4-5 N N Triceps C7    C6 Lateral Arm/ Thumb N N Arm Flex/ Wrist Ext C5-6 N N     C7 Middle Finger N N Arm Ext//Wrist Flex C6-7 N N     C8 4th & 5th Finger N N Flex/ Ext Carpi Ulnaris C8 N N     T1 Medial Arm N N Interossei T1 N N       Sensation Grossly intact to light touch bilateral face and UE as determined by testing branches of trigeminal nerve and dermatomes C2-T2  Reflexes Not tested  Palpation Graded on 0-4 scale (0 = no pain, 1 = pain, 2 = pain with  wincing/grimacing/flinching, 3 = pain with withdrawal, 4 = unwilling to allow palpation) Location LEFT  RIGHT           Temporomandibular Joint (posterior, superior, anterior) 1 0  Temporalis (anterior, middle, posterior fibers) 1 0  Temporalis Tendon Insertion 1 0  Masseter (Zygoma, Body, Lateral surface of angle of mandible) 1 0  Medial ptyergoid 1 1  Frontal Sinus 0 0  Maxillary Sinus 0 0  SCM 1 0  Upper Trapezius 0 0  Subocciptials 0 0    Mandibular AROM Resting Dental Alignment/Occlusion (Overbite, underbite, overjet, crossbite): Lower incisors rest 64mm to the right of upper incisors  Mandible Depression (40-52mm): 41 Mandible Protrusion (3-89mm): 7 Mandible Lateral Excursion (10-22mm): R: 9  L: 13 Audible joint sounds (crepitus or clicking with stethoscope with opening, lateral deviation, and bite): Negative Reciprocal Click (palpation, click with both opening/closing): Positive Deviation of Mouth During Opening: Positive for deviation to the left Deflection of Mouth at End Range: Positive for deviation to the left   Strength R/L Functional Mandible Depression (Jaw Opening): Strong and painless Functional Mandible Protrusion:  Strong and painful on the left Functional Mandible Elevation (Jaw Closing) Strong and painful on the left Functional Mandible Lateral Deviation: Strong and painful on the left with resistance moving bilaterally   Passive Accessory Joint Motion (PAIVM) Deferred   Special Tests Biting on one separator (ipsilateral muscle activation/contralateral joint compression): R: Negative L: Negative Biting on two separators (bilateral muscle activation): R: Negative L: Negative Manual Joint Compression: Positive [name] pain Manual Joint Distraction: Positive for [name] pain   Beighton scale:   LEFT  RIGHT           1. Passive dorsiflexion and hyperextension of the fifth MCP joint beyond 90  0 0   2. Passive apposition of the thumb to the flexor aspect of  the forearm  0  0   3. Passive hyperextension of the elbow beyond 10  0  0   4. Passive  hyperextension of the knee beyond 10      5. Active forward flexion of the trunk with the knees fully extended so that the palms of the hands rest flat on the floor     TOTAL           MMT  MMT (out of 5) Right 10/07/2021 Left 10/07/2021  Cervical (isometric)  Flexion WNL  Extension WNL  Lateral Flexion WNL WNL  Rotation WNL WNL      Shoulder   Flexion 5 5* (neck pain)  Extension    Abduction 5 5  Internal rotation    External rotation    Horizontal abduction    Horizontal adduction    Lower Trapezius    Rhomboids        Elbow  Flexion 5 5  Extension 5 5  Pronation    Supination        Wrist  Flexion 5 5  Extension 5 5  Radial deviation    Ulnar deviation        MCP  Flexion 5 5  Extension 5 5  Abduction    Adduction    (* = pain; Blank rows = not tested)     TODAY'S TREATMENT  None   PATIENT EDUCATION:  Education details: Plan of care Person educated: Patient Education method: Explanation Education comprehension: verbalized understanding   HOME EXERCISE PROGRAM: None currently   ASSESSMENT:  CLINICAL IMPRESSION: Patient is a 41 y.o. female who was seen today for physical therapy evaluation and treatment for TMJ pain. Objective impairments include decreased ROM, postural dysfunction, and pain. These impairments are limiting patient from occupation. Personal factors including Past/current experiences, Time since onset of injury/illness/exacerbation, and 1-2 comorbidities: depression and migraines  are also affecting patient's functional outcome. Patient will benefit from skilled PT to address above impairments and improve overall function.  REHAB POTENTIAL: Excellent  CLINICAL DECISION MAKING: Evolving/moderate complexity  EVALUATION COMPLEXITY: Moderate   GOALS: Goals reviewed with patient? Yes  SHORT TERM GOALS: Target date: 11/01/2021  Pt will be  independent with HEP to improve strength and decrease TMJ pain to improve pain-free function at home and work. Baseline:  Goal status: INITIAL   LONG TERM GOALS: Target date: 11/29/2021  Pt will increase FOTO to at least predicted improvement value to demonstrate significant improvement in function at home and work related to TMJ pain Baseline: 10/04/21: To be completed Goal status: INITIAL  2.  Pt will decrease worst TMJ pain by at least 3 points on the NPRS in order to demonstrate clinically significant reduction in neck pain. Baseline: 10/04/21: worst: 5/10; Goal status: INITIAL  3.  Pt will decrease NDI score by at least 19% in order demonstrate clinically significant reduction in neck pain/disability.       Baseline: 10/04/21: To be completed Goal status: INITIAL  4.  Pt will report at least 75% improvement in her pain when opening her mouth to eat and while talking at work in order to return to full function without significant increase in pain. Baseline:  Goal status: INITIAL   PLAN: PT FREQUENCY: 1-2x/week  PT DURATION: 8 weeks  PLANNED INTERVENTIONS: Therapeutic exercises, Therapeutic activity, Neuromuscular re-education, Balance training, Gait training, Patient/Family education, Joint manipulation, Joint mobilization, Vestibular training, Canalith repositioning, Dry Needling, Electrical stimulation, Spinal manipulation, Spinal mobilization, Cryotherapy, Moist heat, Taping, Traction, Ultrasound, Ionotophoresis 4mg /ml Dexamethasone, and Manual therapy  PLAN FOR NEXT SESSION: Orofacial FOTO, TMJ PAM, consider TDN, STM, issue HEP;  Kirke Breach PT, DPT,  [facility]  Armando Lauman 10/07/2021, 1:38 PM

## 2021-10-04 NOTE — Progress Notes (Signed)
Your patient's labs for her upcoming physical

## 2021-10-04 NOTE — Progress Notes (Unsigned)
Pt at [facility] labs for lab work prior to her physical scheduled with Dr. Lorelei Pont however labs not in place to be drawn. Lacey Rodgers from front desk askkiing for [place] to put in labs for this.   I was able to put in some lab work, and placed in future orders.

## 2021-10-04 NOTE — Progress Notes (Signed)
Reviewed and normal limits Passing out to you Dr. Lorelei Pont because I ordered these in your absence for her prelabs for your physical

## 2021-10-05 ENCOUNTER — Other Ambulatory Visit: Payer: Self-pay

## 2021-10-05 NOTE — Progress Notes (Signed)
For your review These were done prior to annual physical

## 2021-10-10 ENCOUNTER — Encounter: Payer: Self-pay | Admitting: Family Medicine

## 2021-10-10 ENCOUNTER — Other Ambulatory Visit: Payer: Self-pay

## 2021-10-10 ENCOUNTER — Ambulatory Visit (INDEPENDENT_AMBULATORY_CARE_PROVIDER_SITE_OTHER): Payer: No Typology Code available for payment source | Admitting: Family Medicine

## 2021-10-10 VITALS — BP 164/100 | HR 88 | Temp 98.4°F | Ht 60.0 in | Wt 247.5 lb

## 2021-10-10 DIAGNOSIS — Z0000 Encounter for general adult medical examination without abnormal findings: Secondary | ICD-10-CM | POA: Diagnosis not present

## 2021-10-10 MED ORDER — LISINOPRIL-HYDROCHLOROTHIAZIDE 10-12.5 MG PO TABS
1.0000 | ORAL_TABLET | Freq: Every day | ORAL | 5 refills | Status: DC
  Filled 2021-10-10: qty 30, 30d supply, fill #0
  Filled 2021-11-16: qty 30, 30d supply, fill #1
  Filled 2021-12-21 – 2021-12-23 (×2): qty 30, 30d supply, fill #2
  Filled 2022-01-30: qty 30, 30d supply, fill #3
  Filled 2022-03-13: qty 30, 30d supply, fill #4
  Filled 2022-04-19: qty 30, 30d supply, fill #5

## 2021-10-10 NOTE — Progress Notes (Signed)
Erhard Senske T. Kelso Bibby, MD, [facility] [place], [address]  Phone: [phone]  FAX: [phone]  Lacey Rodgers - 41 y.o. female  MRN [id number]  Date of Birth: 09-May-1980  Date: 10/10/2021  PCP: Owens Loffler, MD  Referral: Owens Loffler, MD  Chief Complaint  Patient presents with   Annual Exam   Patient Care Team: Owens Loffler, MD as PCP - General Subjective:   Lacey Rodgers is a 41 y.o. pleasant patient who presents with the following:  Health Maintenance Summary Reviewed and updated, unless pt declines services.  Tobacco History Reviewed. Non-smoker Alcohol: No concerns, no excessive use Exercise Habits: not much right now STD concerns: none Drug Use: None Lumps or breast concerns: no  Covid booster - does not want Flu - at work Mammo?  Wt Readings from Last 3 Encounters:  10/10/21 247 lb 8 oz (112.3 kg)  07/18/21 233 lb 11 oz (106 kg)  07/08/21 233 lb 11 oz (106 kg)    BP Readings from Last 3 Encounters:  10/10/21 (!) 164/100  07/18/21 (!) 156/90  07/08/21 (!) 150/83    160/90  Health Maintenance  Topic Date Due   COVID-19 Vaccine (3 - Moderna risk series) 05/29/2019   PAP SMEAR-Modifier  03/02/2024   TETANUS/TDAP  10/14/2028   INFLUENZA VACCINE  Completed   Hepatitis C Screening  Completed   HIV Screening  Completed   HPV VACCINES  Aged Out    Immunization History  Administered Date(s) Administered   Influenza,inj,Quad PF,6+ Mos 11/12/2013, 10/16/2017, 10/10/2019, 09/29/2021   MMR 12/19/2015   Moderna Sars-Covid-2 Vaccination 04/03/2019, 05/01/2019   Tdap 10/28/2015, 10/15/2018   Patient Active Problem List   Diagnosis Date Noted   Moderate recurrent major depression ([place]) 02/08/2017    Priority: Medium    Migraine aura, persistent 10/10/2012    Priority: Medium    COMMON MIGRAINE 10/23/2008    Priority: Medium    Essential hypertension  10/23/2008    Priority: Medium    BMI 40.0-44.9, adult ([place]) 05/22/2018    Priority: Low   GERD 10/23/2008    Priority: Low   HSV-2 (herpes simplex virus 2) infection 02/28/2021   Numbness and tingling in both hands 06/24/2020   GENITAL HERPES 10/23/2008    Past Medical History:  Diagnosis Date   Depression    Family history of adverse reaction to anesthesia    dad-n/v   Genital herpes    GERD (gastroesophageal reflux disease)    History of kidney stones    currently   Hypertension    Migraine    Obesity affecting pregnancy     Past Surgical History:  Procedure Laterality Date   CESAREAN SECTION N/A 12/16/2015   Procedure: CESAREAN SECTION;  Surgeon: Will Bonnet, MD;  Location: [facility];  Service: Obstetrics;  Laterality: N/A;   CESAREAN SECTION WITH BILATERAL TUBAL LIGATION  12/02/2018   Procedure: CESAREAN SECTION WITH BILATERAL TUBAL LIGATION;  Surgeon: Homero Fellers, MD;  Location: [facility];  Service: Obstetrics;;  TOB 1850 WEIGHT 7lb 5oz LENGTH 19.75 APGAR 9/9   DILATION AND CURETTAGE OF UTERUS  01/2013   INSERTION OF MESH  07/18/2021   Procedure: INSERTION OF MESH;  Surgeon: Robert Bellow, MD;  Location: [facility];  Service: General;;   VENTRAL HERNIA REPAIR N/A 07/18/2021   Procedure: HERNIA REPAIR VENTRAL ADULT;  Surgeon: Robert Bellow, MD;  Location: [facility];  Service: General;  Laterality: N/A;  Floyce Stakes, RNFA to assist    Family History  Problem Relation Age of Onset   Hypertension Mother    Hypertension Father    Hypertension Brother    Cancer Brother 16   Hypertension Maternal Grandmother    Hypertension Maternal Grandfather    Hypertension Paternal Grandmother    Hypertension Paternal Grandfather     Social History   Social History Narrative   Not on file    Past Medical History, Surgical History, Social History, Family History, Problem List, Medications, and Allergies have been reviewed and updated if  relevant.  Review of Systems: Pertinent positives are listed above.  Otherwise, a full 14 point review of systems has been done in full and it is negative except where it is noted positive.  Objective:   BP (!) 164/100   Pulse 88   Temp 98.4 F (36.9 C) (Oral)   Ht 5' (1.524 m)   Wt 247 lb 8 oz (112.3 kg)   LMP 09/19/2021   SpO2 96%   BMI 48.34 kg/m  Ideal Body Weight: Weight in (lb) to have BMI = 25: 127.7 No results found.    10/10/2021    3:46 PM 07/23/2019    3:44 PM 03/08/2017    9:07 AM 02/08/2017    3:11 PM  Depression screen PHQ 2/9  Decreased Interest 0 '2 2 2  ' Down, Depressed, Hopeless 0 '2 2 3  ' PHQ - 2 Score 0 '4 4 5  ' Altered sleeping  '1 2 3  ' Tired, decreased energy  '2 3 3  ' Change in appetite  '1 2 3  ' Feeling bad or failure about yourself   '1 1 2  ' Trouble concentrating  '1 3 3  ' Moving slowly or fidgety/restless  0 0 2  Suicidal thoughts  0 0 0  PHQ-9 Score  '10 15 21  ' Difficult doing work/chores  Somewhat difficult Somewhat difficult Very difficult     GEN: well developed, well nourished, no acute distress Eyes: conjunctiva and lids normal, PERRLA, EOMI ENT: TM clear, nares clear, oral exam WNL Neck: supple, no lymphadenopathy, no thyromegaly, no JVD Pulm: clear to auscultation and percussion, respiratory effort normal CV: regular rate and rhythm, S1-S2, no murmur, rub or gallop, no bruits Chest: no scars, masses, no lumps BREAST: breast exam declined GI: soft, non-tender; no hepatosplenomegaly, masses; active bowel sounds all quadrants GU: GU exam declined Lymph: no cervical, axillary or inguinal adenopathy MSK: gait normal, muscle tone and strength WNL, no joint swelling, effusions, discoloration, crepitus  SKIN: clear, good turgor, color WNL, no rashes, lesions, or ulcerations Neuro: normal mental status, normal strength, sensation, and motion Psych: alert; oriented to person, place and time, normally interactive and not anxious or depressed in  appearance.   All labs reviewed with patient. Results for orders placed or performed during the hospital encounter of 10/04/21  Lipid panel  Result Value Ref Range   Cholesterol 279 (H) 0 - 200 mg/dL   Triglycerides 157 (H) <150 mg/dL   HDL 61 >40 mg/dL   Total CHOL/HDL Ratio 4.6 RATIO   VLDL 31 0 - 40 mg/dL   LDL Cholesterol 187 (H) 0 - 99 mg/dL  CBC  Result Value Ref Range   WBC 7.0 4.0 - 10.5 K/uL   RBC 4.78 3.87 - 5.11 MIL/uL   Hemoglobin 13.9 12.0 - 15.0 g/dL   HCT 42.0 36.0 - 46.0 %   MCV 87.9 80.0 - 100.0 fL   MCH 29.1 26.0 -  34.0 pg   MCHC 33.1 30.0 - 36.0 g/dL   RDW 12.9 11.5 - 15.5 %   Platelets 280 150 - 400 K/uL   nRBC 0.0 0.0 - 0.2 %  Hemoglobin A1c  Result Value Ref Range   Hgb A1c MFr Bld 5.4 4.8 - 5.6 %   Mean Plasma Glucose 108.28 mg/dL  Basic metabolic panel  Result Value Ref Range   Sodium 138 135 - 145 mmol/L   Potassium 3.8 3.5 - 5.1 mmol/L   Chloride 104 98 - 111 mmol/L   CO2 26 22 - 32 mmol/L   Glucose, Bld 96 70 - 99 mg/dL   BUN 12 6 - 20 mg/dL   Creatinine, Ser 0.87 0.44 - 1.00 mg/dL   Calcium 8.9 8.9 - 10.3 mg/dL   GFR, Estimated >60 >60 mL/min   Anion gap 8 5 - 15   No results found.  Assessment and Plan:     ICD-10-CM   1. Healthcare maintenance  Z00.00       Health Maintenance Exam: The patient's preventative maintenance and recommended screening tests for an annual wellness exam were reviewed in full today. Brought up to date unless services declined.  Counselled on the importance of diet, exercise, and its role in overall health and mortality. The patient's FH and SH was reviewed, including their home life, tobacco status, and drug and alcohol status.  Follow-up in 1 year for physical exam or additional follow-up below.  Disposition: No follow-ups on file.  Future Appointments  Date Time Provider [place]  10/13/2021  1:15 PM Phillips Grout, PT [facility] None  10/18/2021  1:15 PM Huprich, Lyndel Safe, PT [facility]  None  10/25/2021  1:15 PM Huprich, Lyndel Safe, PT [facility] None  11/01/2021  1:15 PM Huprich, Lyndel Safe, PT [facility] None  11/08/2021  1:15 PM Huprich, Lyndel Safe, PT [facility] None  11/15/2021  3:30 PM Huprich, Lyndel Safe, PT [facility] None  11/22/2021  1:15 PM Huprich, Lyndel Safe, PT [facility] None  11/29/2021  1:15 PM Huprich, Lyndel Safe, PT [facility] None  12/06/2021  1:15 PM Huprich, Lyndel Safe, PT [facility] None    Meds ordered this encounter  Medications   lisinopril-hydrochlorothiazide (ZESTORETIC) 10-12.5 MG tablet    Sig: Take 1 tablet by mouth daily.    Dispense:  30 tablet    Refill:  5   Medications Discontinued During This Encounter  Medication Reason   ipratropium (ATROVENT) 0.06 % nasal spray Completed Course   labetalol (NORMODYNE) 100 MG tablet    No orders of the defined types were placed in this encounter.   Signed,  Maud Deed. Andreal Vultaggio, MD   Allergies as of 10/10/2021       Reactions   Pseudoeph-doxylamine-dm-apap Hives        Medication List        Accurate as of October 10, 2021  4:12 PM. If you have any questions, ask your nurse or doctor.          STOP taking these medications    ipratropium 0.06 % nasal spray Commonly known as: ATROVENT Stopped by: Owens Loffler, MD   labetalol 100 MG tablet Commonly known as: NORMODYNE Stopped by: Owens Loffler, MD       TAKE these medications    buPROPion 150 MG 12 hr tablet Commonly known as: WELLBUTRIN SR Take 1 tablet (150 mg total) by mouth 2 (two) times daily. What changed: when to take this   escitalopram 20 MG tablet Commonly known  as: LEXAPRO TAKE 1 TABLET BY MOUTH DAILY.   gabapentin 100 MG capsule Commonly known as: Neurontin Follow titration until 3 capsules three times a day is reached. What changed:  how much to take when to take this reasons to take this additional instructions   HYDROcodone-acetaminophen 5-325 MG tablet Commonly known as: NORCO/VICODIN Take 1 tablet by mouth  every 4 (four) hours as needed for moderate pain.   lisinopril-hydrochlorothiazide 10-12.5 MG tablet Commonly known as: ZESTORETIC Take 1 tablet by mouth daily. Started by: Owens Loffler, MD   multivitamin tablet Take 1 tablet by mouth daily.   Nasacort Allergy 24HR 55 MCG/ACT Aero nasal inhaler Generic drug: triamcinolone Place into the nose.   omeprazole 40 MG capsule Commonly known as: PRILOSEC Take 1 capsule (40 mg total) by mouth daily.   valACYclovir 1000 MG tablet Commonly known as: VALTREX Take 1 tablet (1,000 mg total) by mouth at bedtime.

## 2021-10-10 NOTE — Patient Instructions (Signed)
  You do not need a referral to make a mammogram appointment, and you may call to make her own mammogram appointment directly around your schedule.  MAMMOGRAPHY IN [place]:  [facility] [phone] [address] , [place] [address]  [facility] (Formerly [facility]) [address] , [place] [address] Phone: [phone] Toll Free: [phone]  MAMMOGRAPHY IN [place]:  [facility] ([place] or [place]) [phone] Located on the campus of [facility] ([place])  [facility] ([place] Location) [address]  [place], [place] [address]  

## 2021-10-11 ENCOUNTER — Encounter: Payer: Self-pay | Admitting: Family Medicine

## 2021-10-13 ENCOUNTER — Ambulatory Visit: Payer: No Typology Code available for payment source

## 2021-10-13 DIAGNOSIS — M542 Cervicalgia: Secondary | ICD-10-CM

## 2021-10-18 ENCOUNTER — Ambulatory Visit: Payer: No Typology Code available for payment source

## 2021-10-18 DIAGNOSIS — M542 Cervicalgia: Secondary | ICD-10-CM | POA: Diagnosis not present

## 2021-10-18 NOTE — Therapy (Unsigned)
OUTPATIENT PHYSICAL THERAPY TMJ TREATMENT  Patient Name: Lacey Rodgers MRN: [id number] DOB:10/08/1980, 41 y.o., female Today's Date: 10/19/2021   PT End of Session - 10/18/21 1314     Visit Number 2    Number of Visits 17    Date for PT Re-Evaluation 11/29/21    Authorization Type eval: 10/04/21    PT Start Time 1315    PT Stop Time 1400    PT Time Calculation (min) 45 min    Activity Tolerance Patient tolerated treatment well    Behavior During Therapy WFL for tasks assessed/performed            Past Medical History:  Diagnosis Date   Depression    Genital herpes    GERD (gastroesophageal reflux disease)    History of kidney stones    currently   Hypertension    Migraine    Morbid obesity with BMI of 45.0-49.9, adult [facility])    Past Surgical History:  Procedure Laterality Date   CESAREAN SECTION N/A 12/16/2015   Procedure: CESAREAN SECTION;  Surgeon: Will Bonnet, MD;  Location: [facility] ORS;  Service: Obstetrics;  Laterality: N/A;   CESAREAN SECTION WITH BILATERAL TUBAL LIGATION  12/02/2018   Procedure: CESAREAN SECTION WITH BILATERAL TUBAL LIGATION;  Surgeon: Homero Fellers, MD;  Location: [facility] ORS;  Service: Obstetrics;;  TOB 1850 WEIGHT 7lb 5oz LENGTH 19.75 APGAR 9/9   DILATION AND CURETTAGE OF UTERUS  01/2013   INSERTION OF MESH  07/18/2021   Procedure: INSERTION OF MESH;  Surgeon: Robert Bellow, MD;  Location: [facility] ORS;  Service: General;;   VENTRAL HERNIA REPAIR N/A 07/18/2021   Procedure: HERNIA REPAIR VENTRAL ADULT;  Surgeon: Robert Bellow, MD;  Location: [facility] ORS;  Service: General;  Laterality: N/A;  Floyce Stakes, RNFA to assist   Patient Active Problem List   Diagnosis Date Noted   HSV-2 (herpes simplex virus 2) infection 02/28/2021   Numbness and tingling in both hands 06/24/2020   BMI 45.0-49.9, adult ([place]) 05/22/2018   Moderate recurrent major depression ([place]) 02/08/2017   Migraine aura, persistent 10/10/2012   GENITAL  HERPES 10/23/2008   COMMON MIGRAINE 10/23/2008   Essential hypertension 10/23/2008   GERD 10/23/2008   PCP: Owens Loffler, MD  REFERRING PROVIDER: Willaim Rayas, [place]  REFERRING DIAGNOSIS: M26.609 (ICD-10-CM) - TMJ dysfunction  THERAPY DIAG: Cervicalgia  RATIONALE FOR EVALUATION AND TREATMENT: Rehabilitation  ONSET DATE: Unknown, ongoing for multiple years  FOLLOW UP APPT WITH PROVIDER: No, not reported   FROM INITIAL EVALUATION (10/04/21) SUBJECTIVE:  Chief Complaint: [name]  Pertinent History Pt reports [name] pain for multiple years which has worsened notably over the last year. No known jaw trauma at onset. Along with the jaw pain she also complains of L ear pain and fullness. She describes the pain as constant and achy. Pt confirms grinding her teeth at night with significant clenching during the day. She notices inflammation occasionally around the [name] when it is really aggravated. She wears a night guard when sleeping and is considering getting an updated one but is concerned that it might be a large out of pocket expense. She gets regular dental examinations and had her last exam approximately 6 months ago. No recent dental procedures or orthodontics. She does not chew gum. Pt reports significant pain with end range depression, eating chewy food, and talking a lot during the day at work.  Last Dental Examination and imaging: Last exam 6 months ago, unsure of last imaging  Recent dental procedures or orthodontics: No  Pain location: [name], masseter, and ear canal Pain Severity: Present: 0-1/10, Best: 0/10, Worst: 5/10 Pain quality: constant and aching  Radiating pain: No Numbness/Tingling: No Aggravating factors: excessive talking, eating chewy foot, opening mouth  wide Easing factors: massage, rest, ibuprofen  Clicking, catching, or crepitus during chewing: Yes 24 hour pain behavior: fluctuates History of grinding teeth: Yes at night as well as frequent clenching during the day. Recent or remote jaw/face/neck trauma, injury, or pain: No Falls: Has patient fallen in last 6 months? No; History of prior physical therapy for this issue: No  Imaging: No, pt is unsure of last dental imaging but she has never had specific imaging for this issue Progression (improving, worsening, unchanged): worsening over the last year History of headaches/migraines: Yes, history of headaches and migraines Ear symptoms (tinnitus, fullness, pain): Yes, L tinnitus (sometimes right), L ear fullness, L ear pain Chest pain: No Stress/anxiety: Yes, pt endorses fatigue and stress related to serving as caregiver to her two young children. Dominant hand: right Occupational demands: Endoscopy technician Hobbies: spending time with family, camping Red flags: Positive: night sweats, Negative: personal history of cancer, chills/fever, nausea, vomiting, unrelenting pain, unexplained weight gain/loss  Precautions: None  Weight Bearing Restrictions: No  Patient Goals: Pt wants to be able to open her mouth as wide as necessary without pain;   OBJECTIVE  Mental Status Patient is oriented to person, place and time.  Recent memory is intact.  Remote memory is intact.  Attention span and concentration are intact.  Expressive speech is intact.  Patient's fund of knowledge is within normal limits for educational level.  Cranial Nerves Visual acuity and visual fields are intact  Extraocular muscles are intact  Facial sensation is intact bilaterally  Facial strength is intact bilaterally  Hearing is normal as tested by gross conversation Palate elevates midline, normal phonation  Shoulder shrug strength is intact  Tongue protrudes midline   MUSCULOSKELETAL: Tremor: None Bulk:  Normal Tone: Normal Facial Symmetry: Face appears grossly symmetrical but possibly with some deviation of the jaw to the right  Posture Forward head and rounded shoulders in sitting;  Cervical Screen Centralization: Deferred Isometrics: Full and painless in all directions Passive Accessory Intervertebral Motion (PAIVM), central and unilateral (bilaterally): Normal mobility without reproduction of jaw symptoms however pt does report some cervical tenderness. Spurlings A (ipsilateral lateral flexion/axial compression): R: Negative L: Negative Spurlings B (ipsilateral lateral flexion/contralateral rotation/axial compression): R: Negative L: Negative Cervical Distraction Test: Not examined  Cervical Fexion-Rotation Test: Not examined  Hoffman Sign (cervical cord compression): R: Not examined L: Not examined   AROM  AROM (Normal range in degrees) AROM  Cervical  Flexion (50) 50  Extension (80) 38  Right lateral flexion (45) 40*  Left lateral flexion (45) 40*  Right rotation (85)   Left rotation (85)   (* = pain; Blank rows = not tested)   Dermatome/Myotome Screen N=normal  Ab=abnormal Level Dermatome R L Myotome R L Reflex R L  C2 Posterior Scalp N N Cervical Flexion/Extension C1-2 N N Jaw CN V    C3 Anterior Neck N N Cervical Sidebend C2-3 N N Biceps C5-6    C4 Top of Shoulder N N Shoulder Shrug C4 N N Brachiorad. C5-6    C5 Lateral Upper Arm N N Shoulder ABD C4-5 N N Triceps C7    C6 Lateral Arm/ Thumb N N Arm Flex/ Wrist Ext C5-6 N N     C7 Middle Finger N N Arm Ext//Wrist Flex C6-7 N N     C8 4th & 5th Finger N N Flex/ Ext Carpi Ulnaris C8 N N     T1 Medial Arm N N Interossei T1 N N       Sensation Grossly intact to light touch bilateral face and UE as determined by testing branches of trigeminal nerve and dermatomes C2-T2  Reflexes Not tested  Palpation Graded on 0-4 scale (0 = no pain, 1 = pain, 2 = pain with wincing/grimacing/flinching, 3 = pain with withdrawal, 4  = unwilling to allow palpation) Location LEFT  RIGHT           Temporomandibular Joint (posterior, superior, anterior) 1 0  Temporalis (anterior, middle, posterior fibers) 1 0  Temporalis Tendon Insertion 1 0  Masseter (Zygoma, Body, Lateral surface of angle of mandible) 1 0  Medial ptyergoid 1 1  Frontal Sinus 0 0  Maxillary Sinus 0 0  SCM 1 0  Upper Trapezius 0 0  Subocciptials 0 0    Mandibular AROM Resting Dental Alignment/Occlusion (Overbite, underbite, overjet, crossbite): Lower incisors rest 25mm to the right of upper incisors  Mandible Depression (40-64mm): 41 Mandible Protrusion (3-4mm): 7 Mandible Lateral Excursion (10-29mm): R: 9  L: 13 Audible joint sounds (crepitus or clicking with stethoscope with opening, lateral deviation, and bite): Negative Reciprocal Click (palpation, click with both opening/closing): Positive Deviation of Mouth During Opening: Positive for deviation to the left Deflection of Mouth at End Range: Positive for deviation to the left  Strength R/L Functional Mandible Depression (Jaw Opening): Strong and painless Functional Mandible Protrusion:  Strong and painful on the left Functional Mandible Elevation (Jaw Closing) Strong and painful on the left Functional Mandible Lateral Deviation: Strong and painful on the left with resistance moving bilaterally  Passive Accessory Joint Motion (PAIVM) Deferred  Special Tests Biting on one separator (ipsilateral muscle activation/contralateral joint compression): R: Negative L: Negative Biting on two separators (bilateral muscle activation): R: Negative L: Negative Manual Joint Compression: Positive [name] pain Manual Joint Distraction: Positive for [name] pain  Beighton scale:   LEFT  RIGHT           1. Passive dorsiflexion and hyperextension of the fifth MCP joint beyond 90  0 0   2. Passive apposition of the thumb to the flexor aspect of the forearm  0  0   3. Passive hyperextension of the elbow beyond  10  0  0   4. Passive hyperextension of the knee  beyond 10      5. Active forward flexion of the trunk with the knees fully extended so that the palms of the hands rest flat on the floor     TOTAL          MMT  MMT (out of 5) Right 10/19/2021 Left 10/19/2021  Cervical (isometric)  Flexion WNL  Extension WNL  Lateral Flexion WNL WNL  Rotation WNL WNL      Shoulder   Flexion 5 5* (neck pain)  Extension    Abduction 5 5  Internal rotation    External rotation    Horizontal abduction    Horizontal adduction    Lower Trapezius    Rhomboids        Elbow  Flexion 5 5  Extension 5 5  Pronation    Supination        Wrist  Flexion 5 5  Extension 5 5  Radial deviation    Ulnar deviation        MCP  Flexion 5 5  Extension 5 5  Abduction    Adduction    (* = pain; Blank rows = not tested)   TODAY'S TREATMENT    Manual Therapy  STM to L masseter extraoral and intraoral with short duration trigger point release; STM to L SCM with effleurage and trigger point release; [name] inferior and anterior mobilizations, grade I-II, 20s/bout x 2 bouts each; HEP issued and reviewed with patient;   Ther-ex  Supine cervical retractions with overpressure by therapist 2 x 10; Seated scapular retractions x 10; FOTO: 38, predicted improvement to 48   Trigger Point Dry Needling (TDN), unbilled Education performed with patient regarding potential benefit of TDN. Reviewed precautions and risks with patient. Pt provided verbal consent to treatment. With pt supine using clean technique TDN performed to L masseter with 2, 0.25 x 40 single needle placements with deep ache. Pistoning technique utilized.   PATIENT EDUCATION:  Education details: Plan of care and HEP, TMJ education Person educated: Patient  Education method: Explanation and handout Education comprehension: verbalized understanding   HOME EXERCISE PROGRAM: Access Code: [email or web address] URL:  [email or web address] Date: 10/18/2021 Prepared by: Roxana Hires  Exercises - Tongue Clicks TMJ  - 6 x daily - 7 x weekly - 6 reps - Seated Scapular Retraction  - 6 x daily - 7 x weekly - 6 reps - 6s hold - Seated Cervical Retraction  - 6 x daily - 7 x weekly - 6 reps - 6s hold  Patient Education - TMJ Parafunctions - Resting Jaw Position   ASSESSMENT:  CLINICAL IMPRESSION: Initiated STM to masseter and SCM as well as passive accessory mobilizations of [name]. Extensive education with patient regarding activity modification and awareness of resting jaw position. Utilized TDN with notable ache/soreness during needle placement. Issued HEP including scapular and cervical retractions as well as tongue clicks. Pt encouraged to follow-up as scheduled. Plan to progress to long hold isometric strengthening at future appointments for pain modulation as appropriate. Pt will benefit from PT services to address deficits in pain and tissue restriction in order to return to full function at home and work with less pain.   REHAB POTENTIAL: Excellent  CLINICAL DECISION MAKING: Evolving/moderate complexity  EVALUATION COMPLEXITY: Moderate   GOALS: Goals reviewed with patient? Yes  SHORT TERM GOALS: Target date: 11/01/2021  Pt will be independent with HEP to improve strength and decrease TMJ pain to improve pain-free function at home and work. Baseline:  Goal  status: INITIAL   LONG TERM GOALS: Target date: 11/29/2021  Pt will increase FOTO to at least 48 in order to demonstrate significant improvement in function at home and work related to TMJ pain Baseline: 10/04/21: To be completed; 10/18/21: 38 Goal status: INITIAL  2.  Pt will decrease worst TMJ pain by at least 3 points on the NPRS in order to demonstrate clinically significant reduction in neck pain. Baseline: 10/04/21: worst: 5/10; Goal status: INITIAL  3.  Pt will decrease NDI score by at least 19% in order  demonstrate clinically significant reduction in neck pain/disability.       Baseline: 10/04/21: To be completed Goal status: INITIAL  4.  Pt will report at least 75% improvement in her pain when opening her mouth to eat and while talking at work in order to return to full function without significant increase in pain. Baseline:  Goal status: INITIAL   PLAN: PT FREQUENCY: 1-2x/week  PT DURATION: 8 weeks  PLANNED INTERVENTIONS: Therapeutic exercises, Therapeutic activity, Neuromuscular re-education, Balance training, Gait training, Patient/Family education, Joint manipulation, Joint mobilization, Vestibular training, Canalith repositioning, Dry Needling, Electrical stimulation, Spinal manipulation, Spinal mobilization, Cryotherapy, Moist heat, Taping, Traction, Ultrasound, Ionotophoresis 4mg /ml Dexamethasone, and Manual therapy  PLAN FOR NEXT SESSION: review and modify HEP as needed, progress to isometric strengthening;  Lyndel Safe Senai Ramnath PT, DPT, GCS  Rosea Dory 10/19/2021, 11:36 AM

## 2021-10-21 ENCOUNTER — Ambulatory Visit
Admission: EM | Admit: 2021-10-21 | Discharge: 2021-10-21 | Disposition: A | Discharge status: Home / Self Care | Payer: No Typology Code available for payment source | Attending: Emergency Medicine | Admitting: Emergency Medicine

## 2021-10-21 ENCOUNTER — Encounter: Payer: Self-pay | Admitting: Family Medicine

## 2021-10-21 DIAGNOSIS — I10 Essential (primary) hypertension: Secondary | ICD-10-CM | POA: Insufficient documentation

## 2021-10-21 DIAGNOSIS — R300 Dysuria: Secondary | ICD-10-CM | POA: Diagnosis not present

## 2021-10-21 LAB — POCT URINALYSIS DIP (MANUAL ENTRY)
Bilirubin, UA: NEGATIVE
Glucose, UA: NEGATIVE mg/dL
Ketones, POC UA: NEGATIVE mg/dL
Nitrite, UA: NEGATIVE
Protein Ur, POC: 100 mg/dL — AB
Spec Grav, UA: 1.02 (ref 1.010–1.025)
Urobilinogen, UA: 0.2 E.U./dL
pH, UA: 6 (ref 5.0–8.0)

## 2021-10-21 LAB — POCT URINE PREGNANCY: Preg Test, Ur: NEGATIVE

## 2021-10-21 MED ORDER — CEPHALEXIN 500 MG PO CAPS: 500.0000 mg | ORAL_CAPSULE | Freq: Two times a day (BID) | ORAL | 0 refills | Status: AC

## 2021-10-21 NOTE — ED Triage Notes (Signed)
Patient to Urgent Care with complaints of dysuria that started today. Reports that when she uses the bathroom she feels as though she isn't completely emptying her bladder. Denies any vaginal discharge.

## 2021-10-21 NOTE — ED Provider Notes (Signed)
Lacey Rodgers    CSN: [id number] Arrival date & time: 10/21/21  1814      History   Chief Complaint Chief Complaint  Patient presents with   Dysuria    HPI Lacey Rodgers is a 41 y.o. female.  Patient presents with dysuria since this morning.  No fever, abdominal pain, hematuria, vaginal discharge, pelvic pain, flank pain, or other symptoms.  No OTC medication taken.  Her medical history includes hypertension, kidney stones, migraine headaches, morbid obesity.   The history is provided by the patient and medical records.    Past Medical History:  Diagnosis Date   Depression    Genital herpes    GERD (gastroesophageal reflux disease)    History of kidney stones    currently   Hypertension    Migraine    Morbid obesity with BMI of 45.0-49.9, adult [facility])     Patient Active Problem List   Diagnosis Date Noted   HSV-2 (herpes simplex virus 2) infection 02/28/2021   Numbness and tingling in both hands 06/24/2020   BMI 45.0-49.9, adult ([place]) 05/22/2018   Moderate recurrent major depression ([place]) 02/08/2017   Migraine aura, persistent 10/10/2012   GENITAL HERPES 10/23/2008   COMMON MIGRAINE 10/23/2008   Essential hypertension 10/23/2008   GERD 10/23/2008    Past Surgical History:  Procedure Laterality Date   CESAREAN SECTION N/A 12/16/2015   Procedure: CESAREAN SECTION;  Surgeon: Lacey Bonnet, MD;  Location: [facility] ORS;  Service: Obstetrics;  Laterality: N/A;   CESAREAN SECTION WITH BILATERAL TUBAL LIGATION  12/02/2018   Procedure: CESAREAN SECTION WITH BILATERAL TUBAL LIGATION;  Surgeon: Lacey Fellers, MD;  Location: [facility] ORS;  Service: Obstetrics;;  TOB 1850 WEIGHT 7lb 5oz LENGTH 19.75 APGAR 9/9   DILATION AND CURETTAGE OF UTERUS  01/2013   INSERTION OF MESH  07/18/2021   Procedure: INSERTION OF MESH;  Surgeon: Robert Bellow, MD;  Location: [facility] ORS;  Service: General;;   VENTRAL HERNIA REPAIR N/A 07/18/2021   Procedure: HERNIA REPAIR  VENTRAL ADULT;  Surgeon: Robert Bellow, MD;  Location: [facility] ORS;  Service: General;  Laterality: N/A;  Lacey Rodgers, RNFA to assist    OB History     Gravida  4   Para  2   Term  2   Preterm  0   AB  2   Living  2      SAB      IAB      Ectopic      Multiple  0   Live Births  2            Home Medications    Prior to Admission medications   Medication Sig Start Date End Date Taking? Authorizing Provider  cephALEXin (KEFLEX) 500 MG capsule Take 1 capsule (500 mg total) by mouth 2 (two) times daily for 5 days. 10/21/21 10/26/21 Yes Lacey Balloon, NP  buPROPion (WELLBUTRIN SR) 150 MG 12 hr tablet Take 1 tablet (150 mg total) by mouth 2 (two) times daily. Patient taking differently: Take 150 mg by mouth at bedtime. 02/01/21   Copland, Frederico Hamman, MD  escitalopram (LEXAPRO) 20 MG tablet TAKE 1 TABLET BY MOUTH DAILY. 09/28/21 09/28/22  Copland, Frederico Hamman, MD  gabapentin (NEURONTIN) 100 MG capsule Follow titration until 3 capsules three times a day is reached. Patient taking differently: 100-300 mg as needed. 07/19/20   Copland, Frederico Hamman, MD  HYDROcodone-acetaminophen (NORCO/VICODIN) 5-325 MG tablet Take 1 tablet by mouth every 4 (four)  hours as needed for moderate pain. 07/18/21 07/18/22  Earline Mayotte, MD  lisinopril-hydrochlorothiazide (ZESTORETIC) 10-12.5 MG tablet Take 1 tablet by mouth daily. 10/10/21   Copland, Karleen Hampshire, MD  Multiple Vitamin (MULTIVITAMIN) tablet Take 1 tablet by mouth daily.    [provider]  omeprazole (PRILOSEC) 40 MG capsule Take 1 capsule (40 mg total) by mouth daily. 09/28/21   Copland, Karleen Hampshire, MD  triamcinolone (NASACORT) 55 MCG/ACT AERO nasal inhaler Place into the nose. 08/01/21     valACYclovir (VALTREX) 1000 MG tablet Take 1 tablet (1,000 mg total) by mouth at bedtime. 09/30/21   Copland, Karleen Hampshire, MD    Family History Family History  Problem Relation Age of Onset   Hypertension Mother    Hypertension Father    Hypertension  Brother    Cancer Brother 54   Hypertension Maternal Grandmother    Hypertension Maternal Grandfather    Hypertension Paternal Grandmother    Hypertension Paternal Grandfather     Social History Social History   Tobacco Use   Smoking status: Former    Packs/day: 0.25    Years: 6.00    Total pack years: 1.50    Types: Cigarettes   Smokeless tobacco: Never  Vaping Use   Vaping Use: Never used  Substance Use Topics   Alcohol use: Yes    Alcohol/week: 0.0 standard drinks of alcohol    Comment: occ   Drug use: No     Allergies   Pseudoeph-doxylamine-dm-apap   Review of Systems Review of Systems  Constitutional:  Negative for chills and fever.  Gastrointestinal:  Negative for abdominal pain, diarrhea, nausea and vomiting.  Genitourinary:  Positive for dysuria. Negative for flank pain, hematuria, pelvic pain and vaginal discharge.  Skin:  Negative for rash.  All other systems reviewed and are negative.    Physical Exam Triage Vital Signs ED Triage Vitals  Enc Vitals Group     BP      Pulse      Resp      Temp      Temp src      SpO2      Weight      Height      Head Circumference      Peak Flow      Pain Score      Pain Loc      Pain Edu?      Excl. in GC?    No data found.  Updated Vital Signs BP (!) 157/99   Pulse 96   Temp 98.7 F (37.1 C)   Resp 17   Ht 5' (1.524 m)   Wt 247 lb 8 oz (112.3 kg)   LMP 10/17/2021   SpO2 98%   BMI 48.34 kg/m   Visual Acuity Right Eye Distance:   Left Eye Distance:   Bilateral Distance:    Right Eye Near:   Left Eye Near:    Bilateral Near:     Physical Exam Vitals and nursing note reviewed.  Constitutional:      General: She is not in acute distress.    Appearance: Normal appearance. She is well-developed. She is not ill-appearing.  HENT:     Mouth/Throat:     Mouth: Mucous membranes are moist.  Cardiovascular:     Rate and Rhythm: Normal rate and regular rhythm.     Heart sounds: Normal heart  sounds.  Pulmonary:     Effort: Pulmonary effort is normal. No respiratory distress.  Breath sounds: Normal breath sounds.  Abdominal:     General: Bowel sounds are normal.     Palpations: Abdomen is soft.     Tenderness: There is no abdominal tenderness. There is no right CVA tenderness, left CVA tenderness, guarding or rebound.  Musculoskeletal:     Cervical back: Neck supple.  Skin:    General: Skin is warm and dry.  Neurological:     Mental Status: She is alert.  Psychiatric:        Mood and Affect: Mood normal.        Behavior: Behavior normal.      UC Treatments / Results  Labs (all labs ordered are listed, but only abnormal results are displayed) Labs Reviewed  POCT URINALYSIS DIP (MANUAL ENTRY) - Abnormal; Notable for the following components:      Result Value   Color, UA light yellow (*)    Blood, UA moderate (*)    Protein Ur, POC =100 (*)    Leukocytes, UA Small (1+) (*)    All other components within normal limits  URINE CULTURE  POCT URINE PREGNANCY    EKG   Radiology No results found.  Procedures Procedures (including critical care time)  Medications Ordered in UC Medications - No data to display  Initial Impression / Assessment and Plan / UC Course  I have reviewed the triage vital signs and the nursing notes.  Pertinent labs & imaging results that were available during my care of the patient were reviewed by me and considered in my medical decision making (see chart for details).    Dysuria, Elevated blood pressure with hypertension.  Treating with Keflex. Urine culture pending. Discussed with patient that we Lacey call her if the urine culture shows the need to change or discontinue the antibiotic. Instructed her to follow-up with her PCP if her symptoms are not improving. Also discussed with patient that her blood pressure is elevated today and needs to be rechecked by PCP in 1-2 weeks.  Education provided on managing hypertension.  Patient  agrees to plan of care.     Final Clinical Impressions(s) / UC Diagnoses   Final diagnoses:  Dysuria  Elevated blood pressure reading in office with diagnosis of hypertension     Discharge Instructions      Take the antibiotic as directed.  The urine culture is pending.  We Lacey call you if it shows the need to change or discontinue your antibiotic.    Your blood pressure is elevated today at 157/99; recheck 137/89.  Please have this rechecked by your primary care provider in 1-2 weeks.          ED Prescriptions     Medication Sig Dispense Auth. Provider   cephALEXin (KEFLEX) 500 MG capsule Take 1 capsule (500 mg total) by mouth 2 (two) times daily for 5 days. 10 capsule Mickie Bail, NP      PDMP not reviewed this encounter.   Mickie Bail, NP 10/21/21 [phone]

## 2021-10-21 NOTE — Discharge Instructions (Addendum)
Take the antibiotic as directed.  The urine culture is pending.  We will call you if it shows the need to change or discontinue your antibiotic.    Your blood pressure is elevated today at 157/99; recheck 137/89.  Please have this rechecked by your primary care provider in 1-2 weeks.

## 2021-10-23 NOTE — Telephone Encounter (Signed)
She went to urgent care and is on ABX.  Just spoke on the phone with her.

## 2021-10-24 LAB — URINE CULTURE: Culture: >=100000 — AB

## 2021-10-30 NOTE — Therapy (Signed)
OUTPATIENT PHYSICAL THERAPY TMJ TREATMENT  Patient Name: Lacey Rodgers MRN: [id number] DOB:Aug 18, 1980, 41 y.o., female Today's Date: 11/01/2021   PT End of Session - 11/01/21 1314     Visit Number 3    Number of Visits 17    Date for PT Re-Evaluation 11/29/21    Authorization Type eval: 10/04/21    PT Start Time 1315    PT Stop Time 1400    PT Time Calculation (min) 45 min    Activity Tolerance Patient tolerated treatment well    Behavior During Therapy WFL for tasks assessed/performed             Past Medical History:  Diagnosis Date   Depression    Genital herpes    GERD (gastroesophageal reflux disease)    History of kidney stones    currently   Hypertension    Migraine    Morbid obesity with BMI of 45.0-49.9, adult [facility])    Past Surgical History:  Procedure Laterality Date   CESAREAN SECTION N/A 12/16/2015   Procedure: CESAREAN SECTION;  Surgeon: Conard Novak, MD;  Location: [facility] ORS;  Service: Obstetrics;  Laterality: N/A;   CESAREAN SECTION WITH BILATERAL TUBAL LIGATION  12/02/2018   Procedure: CESAREAN SECTION WITH BILATERAL TUBAL LIGATION;  Surgeon: Natale Milch, MD;  Location: [facility] OR[facility];  Service: Obstetrics;;  TOB 1850 WEIGHT 7lb 5oz LENGTH 19.75 APGAR 9/9   DILATION AND CURETTAGE OF UTERUS  01/2013   INSERTION OF MESH  07/18/2021   Procedure: INSERTION OF MESH;  Surgeon: Earline Mayotte, MD;  Location: [facility] ORS;  Service: General;;   VENTRAL HERNIA REPAIR N/A 07/18/2021   Procedure: HERNIA REPAIR VENTRAL ADULT;  Surgeon: Earline Mayotte, MD;  Location: [facility] ORS;  Service: General;  Laterality: N/A;  Sonda Rumble, RNFA to assist   Patient Active Problem List   Diagnosis Date Noted   HSV-2 (herpes simplex virus 2) infection 02/28/2021   Numbness and tingling in both hands 06/24/2020   BMI 45.0-49.9, adult (HCC) 05/22/2018   Moderate recurrent major depression (HCC) 02/08/2017   Migraine aura, persistent 10/10/2012   GENITAL  HERPES 10/23/2008   COMMON MIGRAINE 10/23/2008   Essential hypertension 10/23/2008   GERD 10/23/2008   PCP: [name], MD  REFERRING PROVIDER: [name], PA  REFERRING DIAGNOSIS: M26.609 (ICD-10-CM) - TMJ dysfunction  THERAPY DIAG: Cervicalgia  RATIONALE FOR EVALUATION AND TREATMENT: Rehabilitation  ONSET DATE: Unknown, ongoing for multiple years  FOLLOW UP APPT WITH PROVIDER: No, not reported   FROM INITIAL EVALUATION (10/04/21) SUBJECTIVE:  Chief Complaint: [name]  Pertinent History Pt reports [name] pain for multiple years which has worsened notably over the last year. No known jaw trauma at onset. Along with the jaw pain she also complains of L ear pain and fullness. She describes the pain as constant and achy. Pt confirms grinding her teeth at night with significant clenching during the day. She notices inflammation occasionally around the [name] when it is really aggravated. She wears a night guard when sleeping and is considering getting an updated one but is concerned that it might be a large out of pocket expense. She gets regular dental examinations and had her last exam approximately 6 months ago. No recent dental procedures or orthodontics. She does not chew gum. Pt reports significant pain with end range depression, eating chewy food, and talking a lot during the day at work.  Last Dental Examination and imaging: Last exam 6 months ago, unsure of last imaging  Recent dental procedures or orthodontics: No  Pain location: [name], masseter, and ear canal Pain Severity: Present: 0-1/10, Best: 0/10, Worst: 5/10 Pain quality: constant and aching  Radiating pain: No Numbness/Tingling: No Aggravating factors: excessive talking, eating chewy foot, opening mouth  wide Easing factors: massage, rest, ibuprofen  Clicking, catching, or crepitus during chewing: Yes 24 hour pain behavior: fluctuates History of grinding teeth: Yes at night as well as frequent clenching during the day. Recent or remote jaw/face/neck trauma, injury, or pain: No Falls: Has patient fallen in last 6 months? No; History of prior physical therapy for this issue: No  Imaging: No, pt is unsure of last dental imaging but she has never had specific imaging for this issue Progression (improving, worsening, unchanged): worsening over the last year History of headaches/migraines: Yes, history of headaches and migraines Ear symptoms (tinnitus, fullness, pain): Yes, L tinnitus (sometimes right), L ear fullness, L ear pain Chest pain: No Stress/anxiety: Yes, pt endorses fatigue and stress related to serving as caregiver to her two young children. Dominant hand: right Occupational demands: Endoscopy technician Hobbies: spending time with family, camping Red flags: Positive: night sweats, Negative: personal history of cancer, chills/fever, nausea, vomiting, unrelenting pain, unexplained weight gain/loss  Precautions: None  Weight Bearing Restrictions: No  Patient Goals: Pt wants to be able to open her mouth as wide as necessary without pain;   OBJECTIVE  Mental Status Patient is oriented to person, place and time.  Recent memory is intact.  Remote memory is intact.  Attention span and concentration are intact.  Expressive speech is intact.  Patient's fund of knowledge is within normal limits for educational level.  Cranial Nerves Visual acuity and visual fields are intact  Extraocular muscles are intact  Facial sensation is intact bilaterally  Facial strength is intact bilaterally  Hearing is normal as tested by gross conversation Palate elevates midline, normal phonation  Shoulder shrug strength is intact  Tongue protrudes midline   MUSCULOSKELETAL: Tremor: None Bulk:  Normal Tone: Normal Facial Symmetry: Face appears grossly symmetrical but possibly with some deviation of the jaw to the right  Posture Forward head and rounded shoulders in sitting;  Cervical Screen Centralization: Deferred Isometrics: Full and painless in all directions Passive Accessory Intervertebral Motion (PAIVM), central and unilateral (bilaterally): Normal mobility without reproduction of jaw symptoms however pt does report some cervical tenderness. Spurlings A (ipsilateral lateral flexion/axial compression): R: Negative L: Negative Spurlings B (ipsilateral lateral flexion/contralateral rotation/axial compression): R: Negative L: Negative Cervical Distraction Test: Not examined  Cervical Fexion-Rotation Test: Not examined  Hoffman Sign (cervical cord compression): R: Not examined L: Not examined   AROM  AROM (Normal range in degrees) AROM  Cervical  Flexion (50) 50  Extension (80) 38  Right lateral flexion (45) 40*  Left lateral flexion (45) 40*  Right rotation (85)   Left rotation (85)   (* = pain; Blank rows = not tested)   Dermatome/Myotome Screen N=normal  Ab=abnormal Level Dermatome R L Myotome R L Reflex R L  C2 Posterior Scalp N N Cervical Flexion/Extension C1-2 N N Jaw CN V    C3 Anterior Neck N N Cervical Sidebend C2-3 N N Biceps C5-6    C4 Top of Shoulder N N Shoulder Shrug C4 N N Brachiorad. C5-6    C5 Lateral Upper Arm N N Shoulder ABD C4-5 N N Triceps C7    C6 Lateral Arm/ Thumb N N Arm Flex/ Wrist Ext C5-6 N N     C7 Middle Finger N N Arm Ext//Wrist Flex C6-7 N N     C8 4th & 5th Finger N N Flex/ Ext Carpi Ulnaris C8 N N     T1 Medial Arm N N Interossei T1 N N       Sensation Grossly intact to light touch bilateral face and UE as determined by testing branches of trigeminal nerve and dermatomes C2-T2  Reflexes Not tested  Palpation Graded on 0-4 scale (0 = no pain, 1 = pain, 2 = pain with wincing/grimacing/flinching, 3 = pain with withdrawal, 4  = unwilling to allow palpation) Location LEFT  RIGHT           Temporomandibular Joint (posterior, superior, anterior) 1 0  Temporalis (anterior, middle, posterior fibers) 1 0  Temporalis Tendon Insertion 1 0  Masseter (Zygoma, Body, Lateral surface of angle of mandible) 1 0  Medial ptyergoid 1 1  Frontal Sinus 0 0  Maxillary Sinus 0 0  SCM 1 0  Upper Trapezius 0 0  Subocciptials 0 0    Mandibular AROM Resting Dental Alignment/Occlusion (Overbite, underbite, overjet, crossbite): Lower incisors rest 25mm to the right of upper incisors  Mandible Depression (40-64mm): 41 Mandible Protrusion (3-4mm): 7 Mandible Lateral Excursion (10-29mm): R: 9  L: 13 Audible joint sounds (crepitus or clicking with stethoscope with opening, lateral deviation, and bite): Negative Reciprocal Click (palpation, click with both opening/closing): Positive Deviation of Mouth During Opening: Positive for deviation to the left Deflection of Mouth at End Range: Positive for deviation to the left  Strength R/L Functional Mandible Depression (Jaw Opening): Strong and painless Functional Mandible Protrusion:  Strong and painful on the left Functional Mandible Elevation (Jaw Closing) Strong and painful on the left Functional Mandible Lateral Deviation: Strong and painful on the left with resistance moving bilaterally  Passive Accessory Joint Motion (PAIVM) Deferred  Special Tests Biting on one separator (ipsilateral muscle activation/contralateral joint compression): R: Negative L: Negative Biting on two separators (bilateral muscle activation): R: Negative L: Negative Manual Joint Compression: Positive [name] pain Manual Joint Distraction: Positive for [name] pain  Beighton scale:   LEFT  RIGHT           1. Passive dorsiflexion and hyperextension of the fifth MCP joint beyond 90  0 0   2. Passive apposition of the thumb to the flexor aspect of the forearm  0  0   3. Passive hyperextension of the elbow beyond  10  0  0   4. Passive hyperextension of the knee  beyond 10      5. Active forward flexion of the trunk with the knees fully extended so that the palms of the hands rest flat on the floor     TOTAL          MMT  MMT (out of 5) Right 11/01/2021 Left 11/01/2021  Cervical (isometric)  Flexion WNL  Extension WNL  Lateral Flexion WNL WNL  Rotation WNL WNL      Shoulder   Flexion 5 5* (neck pain)  Extension    Abduction 5 5  Internal rotation    External rotation    Horizontal abduction    Horizontal adduction    Lower Trapezius    Rhomboids        Elbow  Flexion 5 5  Extension 5 5  Pronation    Supination        Wrist  Flexion 5 5  Extension 5 5  Radial deviation    Ulnar deviation        MCP  Flexion 5 5  Extension 5 5  Abduction    Adduction    (* = pain; Blank rows = not tested)   TODAY'S TREATMENT    SUBJECTIVE: Pt states that she is doing well today. She reports improvement in symptoms with the exercises. She had some soreness after the last therapy session until the weekend. No specific questions or concerns.   PAIN: "A tiny bit of pain"   Manual Therapy  STM to L masseter (extraoral and intraoral), L SCM, L subocciptials, and L temporalis with effleurage short duration trigger point release; [name] inferior, anterior, and L lateral mobilizations, grade I-II, 20s/bout x 2 bouts each direction; Prone C2-C4, CPA and L UPA mobilizations grade II-III, 20s/bout x 2 bouts each per level;   Ther-ex  Supine cervical retractions x 10; Supine isometric jaw deviation 6s hold x 6 toward each side; HEP updated and reviewed with patient;   Trigger Point Dry Needling (TDN), unbilled Education previously performed with patient regarding potential benefit of TDN. Previously reviewed precautions and risks with patient. Pt provided verbal consent to treatment. With pt supine using clean technique TDN performed to L masseter and L lateral pterygoid with 2, 0.25 x 40  single needle placements (one in each muscle) with deep ache. Also performed TDN to L suboccipitals with 2, 0.25 x 40 single needle placements. Pistoning technique utilized during all placements.   PATIENT EDUCATION:  Education details: Plan of care and HEP Person educated: Patient  Education method: Explanation and handout Education comprehension: verbalized understanding and returned demonstration   HOME EXERCISE PROGRAM: Access Code: [email or web address] URL: [email or web address] Date: 11/01/2021 Prepared by: Ria Comment  Exercises - Tongue Clicks TMJ  - 6 x daily - 7 x weekly - 6 reps - Seated Scapular Retraction  - 6 x daily - 7 x weekly - 6 reps - 6s hold - Seated Cervical Retraction  - 6 x daily - 7 x weekly - 6 reps - 6s hold - Isometric Jaw Deviation  - 6 x daily - 7 x weekly - 6 reps - 6s hold - Isometric Jaw Deviation (Mirrored)  - 6 x daily - 7 x weekly - 6 reps - 6s hold  Patient Education - TMJ Parafunctions - Resting Jaw Position   ASSESSMENT:  CLINICAL IMPRESSION: Continued STM to L masseter and SCM but also added L suboccipitals and L temporalis. Repeated passive accessory mobilizations of [name] and introduced cervical mobilizations. Repeated TDN but added  placements in L lateral pterygoid and L suboccipitals. Added isometric lateral jaw deviation to HEP. Pt encouraged to follow-up as scheduled. Plan to progress strengthening at future visits. Pt will benefit from PT services to address deficits in pain and tissue restriction in order to return to full function at home and work with less pain.   REHAB POTENTIAL: Excellent  CLINICAL DECISION MAKING: Evolving/moderate complexity  EVALUATION COMPLEXITY: Moderate   GOALS: Goals reviewed with patient? Yes  SHORT TERM GOALS: Target date: 11/01/2021  Pt will be independent with HEP to improve strength and decrease TMJ pain to improve pain-free function at home and work. Baseline:  Goal status:  INITIAL   LONG TERM GOALS: Target date: 11/29/2021  Pt will increase FOTO to at least 48 in order to demonstrate significant improvement in function at home and work related to TMJ pain Baseline: 10/04/21: To be completed; 10/18/21: 38 Goal status: INITIAL  2.  Pt will decrease worst TMJ pain by at least 3 points on the NPRS in order to demonstrate clinically significant reduction in neck pain. Baseline: 10/04/21: worst: 5/10; Goal status: INITIAL  3.  Pt will decrease NDI score by at least 19% in order demonstrate clinically significant reduction in neck pain/disability.       Baseline: 10/04/21: To be completed Goal status: INITIAL  4.  Pt will report at least 75% improvement in her pain when opening her mouth to eat and while talking at work in order to return to full function without significant increase in pain. Baseline:  Goal status: INITIAL   PLAN: PT FREQUENCY: 1-2x/week  PT DURATION: 8 weeks  PLANNED INTERVENTIONS: Therapeutic exercises, Therapeutic activity, Neuromuscular re-education, Balance training, Gait training, Patient/Family education, Joint manipulation, Joint mobilization, Vestibular training, Canalith repositioning, Dry Needling, Electrical stimulation, Spinal manipulation, Spinal mobilization, Cryotherapy, Moist heat, Taping, Traction, Ultrasound, Ionotophoresis 4mg /ml Dexamethasone, and Manual therapy  PLAN FOR NEXT SESSION: NDI, review and modify HEP as needed, progress to isometric strengthening;  Cosmo Tetreault PT, DPT, GCS  Marshall Kampf 11/01/2021, 6:11 PM

## 2021-10-31 ENCOUNTER — Encounter: Payer: Self-pay | Admitting: Family Medicine

## 2021-10-31 ENCOUNTER — Other Ambulatory Visit: Payer: Self-pay

## 2021-10-31 MED ORDER — SULFAMETHOXAZOLE-TRIMETHOPRIM 800-160 MG PO TABS
1.0000 | ORAL_TABLET | Freq: Two times a day (BID) | ORAL | 0 refills | Status: DC
  Filled 2021-10-31: qty 14, 7d supply, fill #0

## 2021-11-01 ENCOUNTER — Ambulatory Visit: Payer: No Typology Code available for payment source

## 2021-11-01 DIAGNOSIS — M542 Cervicalgia: Secondary | ICD-10-CM

## 2021-11-04 NOTE — Therapy (Signed)
OUTPATIENT PHYSICAL THERAPY TMJ TREATMENT  Patient Name: TAREN DYMEK MRN: [id number] DOB:Mar 02, 1980, 41 y.o., female Today's Date: 11/09/2021   PT End of Session - 11/08/21 1313     Visit Number 4    Number of Visits 17    Date for PT Re-Evaluation 11/29/21    Authorization Type eval: 10/04/21    PT Start Time 1315    PT Stop Time 1400    PT Time Calculation (min) 45 min    Activity Tolerance Patient tolerated treatment well    Behavior During Therapy WFL for tasks assessed/performed            Past Medical History:  Diagnosis Date   Depression    Genital herpes    GERD (gastroesophageal reflux disease)    History of kidney stones    currently   Hypertension    Migraine    Morbid obesity with BMI of 45.0-49.9, adult [facility])    Past Surgical History:  Procedure Laterality Date   CESAREAN SECTION N/A 12/16/2015   Procedure: CESAREAN SECTION;  Surgeon: Conard Novak, MD;  Location: [facility] ORS;  Service: Obstetrics;  Laterality: N/A;   CESAREAN SECTION WITH BILATERAL TUBAL LIGATION  12/02/2018   Procedure: CESAREAN SECTION WITH BILATERAL TUBAL LIGATION;  Surgeon: Natale Milch, MD;  Location: [facility] ORS;  Service: Obstetrics;;  TOB 1850 WEIGHT 7lb 5oz LENGTH 19.75 APGAR 9/9   DILATION AND CURETTAGE OF UTERUS  01/2013   INSERTION OF MESH  07/18/2021   Procedure: INSERTION OF MESH;  Surgeon: Earline Mayotte, MD;  Location: [facility] ORS;  Service: General;;   VENTRAL HERNIA REPAIR N/A 07/18/2021   Procedure: HERNIA REPAIR VENTRAL ADULT;  Surgeon: Earline Mayotte, MD;  Location: [facility] ORS;  Service: General;  Laterality: N/A;  Sonda Rumble, RNFA to assist   Patient Active Problem List   Diagnosis Date Noted   HSV-2 (herpes simplex virus 2) infection 02/28/2021   Numbness and tingling in both hands 06/24/2020   BMI 45.0-49.9, adult (HCC) 05/22/2018   Moderate recurrent major depression (HCC) 02/08/2017   Migraine aura, persistent 10/10/2012   GENITAL HERPES  10/23/2008   COMMON MIGRAINE 10/23/2008   Essential hypertension 10/23/2008   GERD 10/23/2008   PCP: [name], MD  REFERRING PROVIDER: [name], PA  REFERRING DIAGNOSIS: M26.609 (ICD-10-CM) - TMJ dysfunction  THERAPY DIAG: Cervicalgia  RATIONALE FOR EVALUATION AND TREATMENT: Rehabilitation  ONSET DATE: Unknown, ongoing for multiple years  FOLLOW UP APPT WITH PROVIDER: No, not reported   FROM INITIAL EVALUATION (10/04/21) SUBJECTIVE:  Chief Complaint: [name]  Pertinent History Pt reports [name] pain for multiple years which has worsened notably over the last year. No known jaw trauma at onset. Along with the jaw pain she also complains of L ear pain and fullness. She describes the pain as constant and achy. Pt confirms grinding her teeth at night with significant clenching during the day. She notices inflammation occasionally around the [name] when it is really aggravated. She wears a night guard when sleeping and is considering getting an updated one but is concerned that it might be a large out of pocket expense. She gets regular dental examinations and had her last exam approximately 6 months ago. No recent dental procedures or orthodontics. She does not chew gum. Pt reports significant pain with end range depression, eating chewy food, and talking a lot during the day at work.  Last Dental Examination and imaging: Last exam 6 months ago, unsure of last imaging  Recent dental procedures or orthodontics: No  Pain location: [name], masseter, and ear canal Pain Severity: Present: 0-1/10, Best: 0/10, Worst: 5/10 Pain quality: constant and aching  Radiating pain: No Numbness/Tingling: No Aggravating factors: excessive talking, eating chewy foot, opening mouth wide Easing  factors: massage, rest, ibuprofen  Clicking, catching, or crepitus during chewing: Yes 24 hour pain behavior: fluctuates History of grinding teeth: Yes at night as well as frequent clenching during the day. Recent or remote jaw/face/neck trauma, injury, or pain: No Falls: Has patient fallen in last 6 months? No; History of prior physical therapy for this issue: No  Imaging: No, pt is unsure of last dental imaging but she has never had specific imaging for this issue Progression (improving, worsening, unchanged): worsening over the last year History of headaches/migraines: Yes, history of headaches and migraines Ear symptoms (tinnitus, fullness, pain): Yes, L tinnitus (sometimes right), L ear fullness, L ear pain Chest pain: No Stress/anxiety: Yes, pt endorses fatigue and stress related to serving as caregiver to her two young children. Dominant hand: right Occupational demands: Endoscopy technician Hobbies: spending time with family, camping Red flags: Positive: night sweats, Negative: personal history of cancer, chills/fever, nausea, vomiting, unrelenting pain, unexplained weight gain/loss  Precautions: None  Weight Bearing Restrictions: No  Patient Goals: Pt wants to be able to open her mouth as wide as necessary without pain;   OBJECTIVE  Mental Status Patient is oriented to person, place and time.  Recent memory is intact.  Remote memory is intact.  Attention span and concentration are intact.  Expressive speech is intact.  Patient's fund of knowledge is within normal limits for educational level.  Cranial Nerves Visual acuity and visual fields are intact  Extraocular muscles are intact  Facial sensation is intact bilaterally  Facial strength is intact bilaterally  Hearing is normal as tested by gross conversation Palate elevates midline, normal phonation  Shoulder shrug strength is intact  Tongue protrudes midline   MUSCULOSKELETAL: Tremor: None Bulk: Normal Tone:  Normal Facial Symmetry: Face appears grossly symmetrical but possibly with some deviation of the jaw to the right  Posture Forward head and rounded shoulders in sitting;  Cervical Screen Centralization: Deferred Isometrics: Full and painless in all directions Passive Accessory Intervertebral Motion (PAIVM), central and unilateral (bilaterally): Normal mobility without reproduction of jaw symptoms however pt does report some cervical tenderness. Spurlings A (ipsilateral lateral flexion/axial compression): R: Negative L: Negative Spurlings B (ipsilateral lateral flexion/contralateral rotation/axial compression): R: Negative L: Negative Cervical Distraction Test: Not examined  Cervical Fexion-Rotation Test: Not examined  Hoffman Sign (cervical cord compression): R: Not examined L: Not examined   AROM  AROM (Normal range in degrees) AROM  Cervical  Flexion (50) 50  Extension (80) 38  Right lateral flexion (45) 40*  Left lateral flexion (45) 40*  Right rotation (85)   Left rotation (85)   (* = pain; Blank rows = not tested)   Dermatome/Myotome Screen N=normal  Ab=abnormal Level Dermatome R L Myotome R L Reflex R L  C2 Posterior Scalp N N Cervical Flexion/Extension C1-2 N N Jaw CN V    C3 Anterior Neck N N Cervical Sidebend C2-3 N N Biceps C5-6    C4 Top of Shoulder N N Shoulder Shrug C4 N N Brachiorad. C5-6    C5 Lateral Upper Arm N N Shoulder ABD C4-5 N N Triceps C7    C6 Lateral Arm/ Thumb N N Arm Flex/ Wrist Ext C5-6 N N     C7 Middle Finger N N Arm Ext//Wrist Flex C6-7 N N     C8 4th & 5th Finger N N Flex/ Ext Carpi Ulnaris C8 N N     T1 Medial Arm N N Interossei T1 N N       Sensation Grossly intact to light touch bilateral face and UE as determined by testing branches of trigeminal nerve and dermatomes C2-T2  Reflexes Not tested  Palpation Graded on 0-4 scale (0 = no pain, 1 = pain, 2 = pain with wincing/grimacing/flinching, 3 = pain with withdrawal, 4 = unwilling  to allow palpation) Location LEFT  RIGHT           Temporomandibular Joint (posterior, superior, anterior) 1 0  Temporalis (anterior, middle, posterior fibers) 1 0  Temporalis Tendon Insertion 1 0  Masseter (Zygoma, Body, Lateral surface of angle of mandible) 1 0  Medial ptyergoid 1 1  Frontal Sinus 0 0  Maxillary Sinus 0 0  SCM 1 0  Upper Trapezius 0 0  Subocciptials 0 0    Mandibular AROM Resting Dental Alignment/Occlusion (Overbite, underbite, overjet, crossbite): Lower incisors rest 3mm to the right of upper incisors  Mandible Depression (40-8355mm): 41 Mandible Protrusion (3-136mm): 7 Mandible Lateral Excursion (10-2212mm): R: 9  L: 13 Audible joint sounds (crepitus or clicking with stethoscope with opening, lateral deviation, and bite): Negative Reciprocal Click (palpation, click with both opening/closing): Positive Deviation of Mouth During Opening: Positive for deviation to the left Deflection of Mouth at End Range: Positive for deviation to the left  Strength R/L Functional Mandible Depression (Jaw Opening): Strong and painless Functional Mandible Protrusion:  Strong and painful on the left Functional Mandible Elevation (Jaw Closing) Strong and painful on the left Functional Mandible Lateral Deviation: Strong and painful on the left with resistance moving bilaterally  Passive Accessory Joint Motion (PAIVM) Deferred  Special Tests Biting on one separator (ipsilateral muscle activation/contralateral joint compression): R: Negative L: Negative Biting on two separators (bilateral muscle activation): R: Negative L: Negative Manual Joint Compression: Positive [name] pain Manual Joint Distraction: Positive for [name] pain  Beighton scale:   LEFT  RIGHT           1. Passive dorsiflexion and hyperextension of the fifth MCP joint beyond 90  0 0   2. Passive apposition of the thumb to the flexor aspect of the forearm  0  0   3. Passive hyperextension of the elbow beyond 10  0  0    4. Passive hyperextension of the knee  beyond 10      5. Active forward flexion of the trunk with the knees fully extended so that the palms of the hands rest flat on the floor     TOTAL          MMT  MMT (out of 5) Right 11/09/2021 Left 11/09/2021  Cervical (isometric)  Flexion WNL  Extension WNL  Lateral Flexion WNL WNL  Rotation WNL WNL      Shoulder   Flexion 5 5* (neck pain)  Extension    Abduction 5 5  Internal rotation    External rotation    Horizontal abduction    Horizontal adduction    Lower Trapezius    Rhomboids        Elbow  Flexion 5 5  Extension 5 5  Pronation    Supination        Wrist  Flexion 5 5  Extension 5 5  Radial deviation    Ulnar deviation        MCP  Flexion 5 5  Extension 5 5  Abduction    Adduction    (* = pain; Blank rows = not tested)   TODAY'S TREATMENT    SUBJECTIVE: Pt states that she is doing alright today. She reports continued [name] pain. She has been performing her HEP some but not consistently.  She reports occipital soreness for 3-4 days after the last therapy session.    PAIN: No resting [name] pain but persistent cervical pain;   Manual Therapy  STM to L masseter (extraoral and intraoral), L SCM, and L temporalis with effleurage and short duration trigger point release; Supine intermittent cervical traction 15s hold/15s relax x 2; Supine upper trap, lateral flexion, and levator stretch x 30s each bilateral; [name] inferior, anterior, and R lateral mobilizations, grade I-II, 20s/bout x 2 bouts each direction; Prone C2-C4, CPA and bilateral UPA mobilizations grade II-III, 20s/bout x 1 bouts each per level each; Prone L C1 lateral mass mobilizations, grade I, 20s/bout x 2 bouts;   Ther-ex  Supine cervical retractions x 10; Supine isometric jaw deviation 6s hold x 6 toward each side; Supine isometric mandibular depression and elevation 6s hold x 6 each; Performed tongue clicks with pt demonstrating proper  technique; HEP reviewed and reinforced with patint;   Trigger Point Dry Needling (TDN), unbilled Education previously performed with patient regarding potential benefit of TDN. Previously reviewed precautions and risks with patient. Pt provided verbal consent to treatment. With pt supine using clean technique TDN performed to L masseter, L lateral pterygoid, and L SCM with 3, 0.25 x 40 single needle placements (one in each muscle) with deep ache and local twitch response. Pt reports some temporalis soreness at end of session.   PATIENT EDUCATION:  Education details: Pt educated throughout session about proper posture and technique with exercises. Improved exercise technique, movement at target joints, use of target muscles after min to mod verbal, visual, tactile cues. Reinforced importance of HEP Person educated: Patient  Education method: Explanation Education comprehension: verbalized understanding and returned demonstration   HOME EXERCISE PROGRAM: Access Code: [email or web address] URL: [email or web address] Date: 11/01/2021 Prepared by: Ria Comment  Exercises - Tongue Clicks TMJ  - 6 x daily - 7 x weekly - 6 reps - Seated Scapular Retraction  - 6 x daily - 7 x weekly - 6 reps - 6s hold - Seated Cervical Retraction  - 6 x daily - 7 x weekly - 6 reps - 6s hold - Isometric  Jaw Deviation  - 6 x daily - 7 x weekly - 6 reps - 6s hold - Isometric Jaw Deviation (Mirrored)  - 6 x daily - 7 x weekly - 6 reps - 6s hold  Patient Education - TMJ Parafunctions - Resting Jaw Position   ASSESSMENT:  CLINICAL IMPRESSION: Continued STM to L masseter, SCM, and temporalis. Repeated passive accessory mobilizations of [name] and cervical mobilizations. Repeated TDN but added placement in L temporalis. Added isometric mandibular elevation and depression strengthening. No HEP modifications on this date but pt encouraged to perform her current program consistently. Advised pt to follow-up as  scheduled. Plan to progress strengthening at future visits. If symptoms persistent pt plans to discuss a specialty nighttime TMJ oral appliance with her dentist. Pt will benefit from PT services to address deficits in pain and tissue restriction in order to return to full function at home and work with less pain.   REHAB POTENTIAL: Excellent  CLINICAL DECISION MAKING: Evolving/moderate complexity  EVALUATION COMPLEXITY: Moderate   GOALS: Goals reviewed with patient? Yes  SHORT TERM GOALS: Target date: 11/01/2021  Pt will be independent with HEP to improve strength and decrease TMJ pain to improve pain-free function at home and work. Baseline:  Goal status: INITIAL   LONG TERM GOALS: Target date: 11/29/2021  Pt will increase FOTO to at least 48 in order to demonstrate significant improvement in function at home and work related to TMJ pain Baseline: 10/04/21: To be completed; 10/18/21: 38 Goal status: INITIAL  2.  Pt will decrease worst TMJ pain by at least 3 points on the NPRS in order to demonstrate clinically significant reduction in neck pain. Baseline: 10/04/21: worst: 5/10; 11/08/21: 5/10; Goal status: INITIAL  3.  Pt will decrease NDI score by at least 19% in order demonstrate clinically significant reduction in neck pain/disability.       Baseline: 10/04/21: To be completed Goal status: INITIAL  4.  Pt will report at least 75% improvement in her pain when opening her mouth to eat and while talking at work in order to return to full function without significant increase in pain. Baseline: 11/08/21: Unchanged Goal status: INITIAL   PLAN: PT FREQUENCY: 1-2x/week  PT DURATION: 8 weeks  PLANNED INTERVENTIONS: Therapeutic exercises, Therapeutic activity, Neuromuscular re-education, Balance training, Gait training, Patient/Family education, Joint manipulation, Joint mobilization, Vestibular training, Canalith repositioning, Dry Needling, Electrical stimulation, Spinal  manipulation, Spinal mobilization, Cryotherapy, Moist heat, Taping, Traction, Ultrasound, Ionotophoresis 4mg /ml Dexamethasone, and Manual therapy  PLAN FOR NEXT SESSION: NDI, review and modify HEP as needed, progress to isometric strengthening;  Lyndel Safe Gennesis Hogland PT, DPT, GCS  Ramadan Couey 11/09/2021, 9:59 AM

## 2021-11-07 ENCOUNTER — Other Ambulatory Visit: Payer: Self-pay | Admitting: Family Medicine

## 2021-11-07 DIAGNOSIS — Z1231 Encounter for screening mammogram for malignant neoplasm of breast: Secondary | ICD-10-CM

## 2021-11-08 ENCOUNTER — Ambulatory Visit: Payer: No Typology Code available for payment source | Attending: Student

## 2021-11-08 DIAGNOSIS — M542 Cervicalgia: Secondary | ICD-10-CM | POA: Diagnosis not present

## 2021-11-14 NOTE — Therapy (Incomplete)
OUTPATIENT PHYSICAL THERAPY TMJ TREATMENT  Patient Name: Lacey Rodgers MRN: [id number] DOB:[birth year], 41 y.o., female Today's Date: 11/14/2021    Past Medical History:  Diagnosis Date   Depression    Genital herpes    GERD (gastroesophageal reflux disease)    History of kidney stones    currently   Hypertension    Migraine    Morbid obesity with BMI of 45.0-49.9, adult [facility])    Past Surgical History:  Procedure Laterality Date   CESAREAN SECTION N/A 12/16/2015   Procedure: CESAREAN SECTION;  Surgeon: Conard Novak, MD;  Location: [facility] OR[facility];  Service: Obstetrics;  Laterality: N/A;   CESAREAN SECTION WITH BILATERAL TUBAL LIGATION  12/02/2018   Procedure: CESAREAN SECTION WITH BILATERAL TUBAL LIGATION;  Surgeon: Natale Milch, MD;  Location: [facility];  Service: Obstetrics;;  TOB 1850 WEIGHT 7lb 5oz LENGTH 19.75 APGAR 9/9   DILATION AND CURETTAGE OF UTERUS  01/2013   INSERTION OF MESH  07/18/2021   Procedure: INSERTION OF MESH;  Surgeon: Earline Mayotte, MD;  Location: [facility];  Service: General;;   VENTRAL HERNIA REPAIR N/A 07/18/2021   Procedure: HERNIA REPAIR VENTRAL ADULT;  Surgeon: Earline Mayotte, MD;  Location: [facility];  Service: General;  Laterality: N/A;  Sonda Rumble, RNFA to assist   Patient Active Problem List   Diagnosis Date Noted   HSV-2 (herpes simplex virus 2) infection 02/28/2021   Numbness and tingling in both hands 06/24/2020   BMI 45.0-49.9, adult (HCC) 05/22/2018   Moderate recurrent major depression (HCC) 02/08/2017   Migraine aura, persistent 10/10/2012   GENITAL HERPES 10/23/2008   COMMON MIGRAINE 10/23/2008   Essential hypertension 10/23/2008   GERD 10/23/2008   PCP: [name], MD  REFERRING PROVIDER: [name], MD  REFERRING DIAGNOSIS: M26.609 (ICD-10-CM) - TMJ dysfunction  THERAPY DIAG: Cervicalgia  RATIONALE FOR EVALUATION AND TREATMENT: Rehabilitation  ONSET DATE: Unknown, ongoing for multiple  years  FOLLOW UP APPT WITH PROVIDER: No, not reported   FROM INITIAL EVALUATION (10/04/21) SUBJECTIVE:                                                                                                                                                                                         Chief Complaint: [name]  Pertinent History Pt reports [name] pain for multiple years which has worsened notably over the last year. No known jaw trauma at onset. Along with the jaw pain she also complains of L ear pain and fullness. She describes the pain as constant and achy. Pt confirms grinding her teeth at night with significant clenching during the day. She  notices inflammation occasionally around the [name] when it is really aggravated. She wears a night guard when sleeping and is considering getting an updated one but is concerned that it might be a large out of pocket expense. She gets regular dental examinations and had her last exam approximately 6 months ago. No recent dental procedures or orthodontics. She does not chew gum. Pt reports significant pain with end range depression, eating chewy food, and talking a lot during the day at work.  Last Dental Examination and imaging: Last exam 6 months ago, unsure of last imaging  Recent dental procedures or orthodontics: No  Pain location: [name], masseter, and ear canal Pain Severity: Present: 0-1/10, Best: 0/10, Worst: 5/10 Pain quality: constant and aching  Radiating pain: No Numbness/Tingling: No Aggravating factors: excessive talking, eating chewy foot, opening mouth wide Easing factors: massage, rest, ibuprofen  Clicking, catching, or crepitus during chewing: Yes 24 hour pain behavior: fluctuates History of grinding teeth: Yes at night as well as frequent clenching during the day. Recent or remote jaw/face/neck trauma, injury, or pain: No Falls: Has patient fallen in last 6 months? No; History of prior physical therapy for this issue: No   Imaging: No, pt is unsure of last dental imaging but she has never had specific imaging for this issue Progression (improving, worsening, unchanged): worsening over the last year History of headaches/migraines: Yes, history of headaches and migraines Ear symptoms (tinnitus, fullness, pain): Yes, L tinnitus (sometimes right), L ear fullness, L ear pain Chest pain: No Stress/anxiety: Yes, pt endorses fatigue and stress related to serving as caregiver to her two young children. Dominant hand: right Occupational demands: Endoscopy technician Hobbies: spending time with family, camping Red flags: Positive: night sweats, Negative: personal history of cancer, chills/fever, nausea, vomiting, unrelenting pain, unexplained weight gain/loss  Precautions: None  Weight Bearing Restrictions: No  Patient Goals: Pt wants to be able to open her mouth as wide as necessary without pain;   OBJECTIVE  Mental Status Patient is oriented to person, place and time.  Recent memory is intact.  Remote memory is intact.  Attention span and concentration are intact.  Expressive speech is intact.  Patient's fund of knowledge is within normal limits for educational level.  Cranial Nerves Visual acuity and visual fields are intact  Extraocular muscles are intact  Facial sensation is intact bilaterally  Facial strength is intact bilaterally  Hearing is normal as tested by gross conversation Palate elevates midline, normal phonation  Shoulder shrug strength is intact  Tongue protrudes midline   MUSCULOSKELETAL: Tremor: None Bulk: Normal Tone: Normal Facial Symmetry: Face appears grossly symmetrical but possibly with some deviation of the jaw to the right  Posture Forward head and rounded shoulders in sitting;  Cervical Screen Centralization: Deferred Isometrics: Full and painless in all directions Passive Accessory Intervertebral Motion (PAIVM), central and unilateral (bilaterally): Normal mobility  without reproduction of jaw symptoms however pt does report some cervical tenderness. Spurlings A (ipsilateral lateral flexion/axial compression): R: Negative L: Negative Spurlings B (ipsilateral lateral flexion/contralateral rotation/axial compression): R: Negative L: Negative Cervical Distraction Test: Not examined  Cervical Fexion-Rotation Test: Not examined  Hoffman Sign (cervical cord compression): R: Not examined L: Not examined   AROM  AROM (Normal range in degrees) AROM  Cervical  Flexion (50) 50  Extension (80) 38  Right lateral flexion (45) 40*  Left lateral flexion (45) 40*  Right rotation (85)   Left rotation (85)   (* = pain; Blank rows = not  tested)   Dermatome/Myotome Screen N=normal  Ab=abnormal Level Dermatome R L Myotome R L Reflex R L  C2 Posterior Scalp N N Cervical Flexion/Extension C1-2 N N Jaw CN V    C3 Anterior Neck N N Cervical Sidebend C2-3 N N Biceps C5-6    C4 Top of Shoulder N N Shoulder Shrug C4 N N Brachiorad. C5-6    C5 Lateral Upper Arm N N Shoulder ABD C4-5 N N Triceps C7    C6 Lateral Arm/ Thumb N N Arm Flex/ Wrist Ext C5-6 N N     C7 Middle Finger N N Arm Ext//Wrist Flex C6-7 N N     C8 4th & 5th Finger N N Flex/ Ext Carpi Ulnaris C8 N N     T1 Medial Arm N N Interossei T1 N N       Sensation Grossly intact to light touch bilateral face and UE as determined by testing branches of trigeminal nerve and dermatomes C2-T2  Reflexes Not tested  Palpation Graded on 0-4 scale (0 = no pain, 1 = pain, 2 = pain with wincing/grimacing/flinching, 3 = pain with withdrawal, 4 = unwilling to allow palpation) Location LEFT  RIGHT           Temporomandibular Joint (posterior, superior, anterior) 1 0  Temporalis (anterior, middle, posterior fibers) 1 0  Temporalis Tendon Insertion 1 0  Masseter (Zygoma, Body, Lateral surface of angle of mandible) 1 0  Medial ptyergoid 1 1  Frontal Sinus 0 0  Maxillary Sinus 0 0  SCM 1 0  Upper Trapezius 0 0   Subocciptials 0 0    Mandibular AROM Resting Dental Alignment/Occlusion (Overbite, underbite, overjet, crossbite): Lower incisors rest 79mm to the right of upper incisors  Mandible Depression (40-55mm): 41 Mandible Protrusion (3-20mm): 7 Mandible Lateral Excursion (10-38mm): R: 9  L: 13 Audible joint sounds (crepitus or clicking with stethoscope with opening, lateral deviation, and bite): Negative Reciprocal Click (palpation, click with both opening/closing): Positive Deviation of Mouth During Opening: Positive for deviation to the left Deflection of Mouth at End Range: Positive for deviation to the left  Strength R/L Functional Mandible Depression (Jaw Opening): Strong and painless Functional Mandible Protrusion:  Strong and painful on the left Functional Mandible Elevation (Jaw Closing) Strong and painful on the left Functional Mandible Lateral Deviation: Strong and painful on the left with resistance moving bilaterally  Passive Accessory Joint Motion (PAIVM) Deferred  Special Tests Biting on one separator (ipsilateral muscle activation/contralateral joint compression): R: Negative L: Negative Biting on two separators (bilateral muscle activation): R: Negative L: Negative Manual Joint Compression: Positive [name] pain Manual Joint Distraction: Positive for [name] pain  Beighton scale:   LEFT  RIGHT           1. Passive dorsiflexion and hyperextension of the fifth MCP joint beyond 90  0 0   2. Passive apposition of the thumb to the flexor aspect of the forearm  0  0   3. Passive hyperextension of the elbow beyond 10  0  0   4. Passive hyperextension of the knee beyond 10      5. Active forward flexion of the trunk with the knees fully extended so that the palms of the hands rest flat on the floor     TOTAL          MMT  MMT (out of 5) Right 11/14/2021 Left 11/14/2021  Cervical (isometric)  Flexion WNL  Extension WNL  Lateral Flexion WNL  WNL  Rotation WNL WNL       Shoulder   Flexion 5 5* (neck pain)  Extension    Abduction 5 5  Internal rotation    External rotation    Horizontal abduction    Horizontal adduction    Lower Trapezius    Rhomboids        Elbow  Flexion 5 5  Extension 5 5  Pronation    Supination        Wrist  Flexion 5 5  Extension 5 5  Radial deviation    Ulnar deviation        MCP  Flexion 5 5  Extension 5 5  Abduction    Adduction    (* = pain; Blank rows = not tested)   TODAY'S TREATMENT    SUBJECTIVE: Pt states that she is doing alright today. She reports continued [name] pain. She has been performing her HEP some but not consistently.  She reports occipital soreness for 3-4 days after the last therapy session.    PAIN: No resting [name] pain but persistent cervical pain;   Manual Therapy  STM to L masseter (extraoral and intraoral), L SCM, and L temporalis with effleurage and short duration trigger point release; Supine intermittent cervical traction 15s hold/15s relax x 2; Supine upper trap, lateral flexion, and levator stretch x 30s each bilateral; [name] inferior, anterior, and R lateral mobilizations, grade I-II, 20s/bout x 2 bouts each direction; Prone C2-C4, CPA and bilateral UPA mobilizations grade II-III, 20s/bout x 1 bouts each per level each; Prone L C1 lateral mass mobilizations, grade I, 20s/bout x 2 bouts;   Ther-ex  Supine cervical retractions x 10; Supine isometric jaw deviation 6s hold x 6 toward each side; Supine isometric mandibular depression and elevation 6s hold x 6 each; Performed tongue clicks with pt demonstrating proper technique; HEP reviewed and reinforced with patint;   Trigger Point Dry Needling (TDN), unbilled Education previously performed with patient regarding potential benefit of TDN. Previously reviewed precautions and risks with patient. Pt provided verbal consent to treatment. With pt supine using clean technique TDN performed to L masseter, L lateral pterygoid,  and L SCM with 3, 0.25 x 40 single needle placements (one in each muscle) with deep ache and local twitch response. Pt reports some temporalis soreness at end of session.   PATIENT EDUCATION:  Education details: Pt educated throughout session about proper posture and technique with exercises. Improved exercise technique, movement at target joints, use of target muscles after min to mod verbal, visual, tactile cues. Reinforced importance of HEP Person educated: Patient  Education method: Explanation Education comprehension: verbalized understanding and returned demonstration   HOME EXERCISE PROGRAM: Access Code: [email or web address] URL: [email or web address] Date: 11/01/2021 Prepared by: Ria Comment  Exercises - Tongue Clicks TMJ  - 6 x daily - 7 x weekly - 6 reps - Seated Scapular Retraction  - 6 x daily - 7 x weekly - 6 reps - 6s hold - Seated Cervical Retraction  - 6 x daily - 7 x weekly - 6 reps - 6s hold - Isometric Jaw Deviation  - 6 x daily - 7 x weekly - 6 reps - 6s hold - Isometric Jaw Deviation (Mirrored)  - 6 x daily - 7 x weekly - 6 reps - 6s hold  Patient Education - TMJ Parafunctions - Resting Jaw Position   ASSESSMENT:  CLINICAL IMPRESSION: Continued STM to L masseter, SCM, and temporalis. Repeated passive accessory mobilizations of L  TMJ and cervical mobilizations. Repeated TDN but added placement in L temporalis. Added isometric mandibular elevation and depression strengthening. No HEP modifications on this date but pt encouraged to perform her current program consistently. Advised pt to follow-up as scheduled. Plan to progress strengthening at future visits. If symptoms persistent pt plans to discuss a specialty nighttime TMJ oral appliance with her dentist. Pt will benefit from PT services to address deficits in pain and tissue restriction in order to return to full function at home and work with less pain.   REHAB POTENTIAL: Excellent  CLINICAL DECISION  MAKING: Evolving/moderate complexity  EVALUATION COMPLEXITY: Moderate   GOALS: Goals reviewed with patient? Yes  SHORT TERM GOALS: Target date: 11/01/2021  Pt will be independent with HEP to improve strength and decrease TMJ pain to improve pain-free function at home and work. Baseline:  Goal status: INITIAL   LONG TERM GOALS: Target date: 11/29/2021  Pt will increase FOTO to at least 48 in order to demonstrate significant improvement in function at home and work related to TMJ pain Baseline: 10/04/21: To be completed; 10/18/21: 38 Goal status: INITIAL  2.  Pt will decrease worst TMJ pain by at least 3 points on the NPRS in order to demonstrate clinically significant reduction in neck pain. Baseline: 10/04/21: worst: 5/10; 11/08/21: 5/10; Goal status: INITIAL  3.  Pt will decrease NDI score by at least 19% in order demonstrate clinically significant reduction in neck pain/disability.       Baseline: 10/04/21: To be completed Goal status: INITIAL  4.  Pt will report at least 75% improvement in her pain when opening her mouth to eat and while talking at work in order to return to full function without significant increase in pain. Baseline: 11/08/21: Unchanged Goal status: INITIAL   PLAN: PT FREQUENCY: 1-2x/week  PT DURATION: 8 weeks  PLANNED INTERVENTIONS: Therapeutic exercises, Therapeutic activity, Neuromuscular re-education, Balance training, Gait training, Patient/Family education, Joint manipulation, Joint mobilization, Vestibular training, Canalith repositioning, Dry Needling, Electrical stimulation, Spinal manipulation, Spinal mobilization, Cryotherapy, Moist heat, Taping, Traction, Ultrasound, Ionotophoresis 4mg /ml Dexamethasone, and Manual therapy  PLAN FOR NEXT SESSION: NDI, review and modify HEP as needed, progress to isometric strengthening;  Cymone Yeske PT, DPT, GCS  Draco Malczewski 11/14/2021, 10:10 AM

## 2021-11-15 ENCOUNTER — Ambulatory Visit: Payer: No Typology Code available for payment source

## 2021-11-15 DIAGNOSIS — M542 Cervicalgia: Secondary | ICD-10-CM

## 2021-11-16 ENCOUNTER — Other Ambulatory Visit: Payer: Self-pay

## 2021-11-20 NOTE — Therapy (Incomplete)
OUTPATIENT PHYSICAL THERAPY TMJ TREATMENT  Patient Name: Lacey Rodgers MRN: [id number] DOB:01/14/1980, 41 y.o., female Today's Date: 11/20/2021    Past Medical History:  Diagnosis Date   Depression    Genital herpes    GERD (gastroesophageal reflux disease)    History of kidney stones    currently   Hypertension    Migraine    Morbid obesity with BMI of 45.0-49.9, adult [facility])    Past Surgical History:  Procedure Laterality Date   CESAREAN SECTION N/A 12/16/2015   Procedure: CESAREAN SECTION;  Surgeon: Conard Novak, MD;  Location: [facility] ORS;  Service: Obstetrics;  Laterality: N/A;   CESAREAN SECTION WITH BILATERAL TUBAL LIGATION  12/02/2018   Procedure: CESAREAN SECTION WITH BILATERAL TUBAL LIGATION;  Surgeon: Natale Milch, MD;  Location: [facility] ORS;  Service: Obstetrics;;  TOB 1850 WEIGHT 7lb 5oz LENGTH 19.75 APGAR 9/9   DILATION AND CURETTAGE OF UTERUS  01/2013   INSERTION OF MESH  07/18/2021   Procedure: INSERTION OF MESH;  Surgeon: Earline Mayotte, MD;  Location: [facility] ORS;  Service: General;;   VENTRAL HERNIA REPAIR N/A 07/18/2021   Procedure: HERNIA REPAIR VENTRAL ADULT;  Surgeon: Earline Mayotte, MD;  Location: [facility] ORS;  Service: General;  Laterality: N/A;  Sonda Rumble, RNFA to assist   Patient Active Problem List   Diagnosis Date Noted   HSV-2 (herpes simplex virus 2) infection 02/28/2021   Numbness and tingling in both hands 06/24/2020   BMI 45.0-49.9, adult (HCC) 05/22/2018   Moderate recurrent major depression (HCC) 02/08/2017   Migraine aura, persistent 10/10/2012   GENITAL HERPES 10/23/2008   COMMON MIGRAINE 10/23/2008   Essential hypertension 10/23/2008   GERD 10/23/2008   PCP: [name], MD  REFERRING PROVIDER: [name], MD  REFERRING DIAGNOSIS: M26.609 (ICD-10-CM) - TMJ dysfunction  THERAPY DIAG: Cervicalgia  RATIONALE FOR EVALUATION AND TREATMENT: Rehabilitation  ONSET DATE: Unknown, ongoing for multiple  years  FOLLOW UP APPT WITH PROVIDER: No, not reported   FROM INITIAL EVALUATION (10/04/21) SUBJECTIVE:                                                                                                                                                                                         Chief Complaint: [name]  Pertinent History Pt reports [name] pain for multiple years which has worsened notably over the last year. No known jaw trauma at onset. Along with the jaw pain she also complains of L ear pain and fullness. She describes the pain as constant and achy. Pt confirms grinding her teeth at night with significant clenching during the day. She  notices inflammation occasionally around the [name] when it is really aggravated. She wears a night guard when sleeping and is considering getting an updated one but is concerned that it might be a large out of pocket expense. She gets regular dental examinations and had her last exam approximately 6 months ago. No recent dental procedures or orthodontics. She does not chew gum. Pt reports significant pain with end range depression, eating chewy food, and talking a lot during the day at work.  Last Dental Examination and imaging: Last exam 6 months ago, unsure of last imaging  Recent dental procedures or orthodontics: No  Pain location: [name], masseter, and ear canal Pain Severity: Present: 0-1/10, Best: 0/10, Worst: 5/10 Pain quality: constant and aching  Radiating pain: No Numbness/Tingling: No Aggravating factors: excessive talking, eating chewy foot, opening mouth wide Easing factors: massage, rest, ibuprofen  Clicking, catching, or crepitus during chewing: Yes 24 hour pain behavior: fluctuates History of grinding teeth: Yes at night as well as frequent clenching during the day. Recent or remote jaw/face/neck trauma, injury, or pain: No Falls: Has patient fallen in last 6 months? No; History of prior physical therapy for this issue: No   Imaging: No, pt is unsure of last dental imaging but she has never had specific imaging for this issue Progression (improving, worsening, unchanged): worsening over the last year History of headaches/migraines: Yes, history of headaches and migraines Ear symptoms (tinnitus, fullness, pain): Yes, L tinnitus (sometimes right), L ear fullness, L ear pain Chest pain: No Stress/anxiety: Yes, pt endorses fatigue and stress related to serving as caregiver to her two young children. Dominant hand: right Occupational demands: Endoscopy technician Hobbies: spending time with family, camping Red flags: Positive: night sweats, Negative: personal history of cancer, chills/fever, nausea, vomiting, unrelenting pain, unexplained weight gain/loss  Precautions: None  Weight Bearing Restrictions: No  Patient Goals: Pt wants to be able to open her mouth as wide as necessary without pain;   OBJECTIVE  Mental Status Patient is oriented to person, place and time.  Recent memory is intact.  Remote memory is intact.  Attention span and concentration are intact.  Expressive speech is intact.  Patient's fund of knowledge is within normal limits for educational level.  Cranial Nerves Visual acuity and visual fields are intact  Extraocular muscles are intact  Facial sensation is intact bilaterally  Facial strength is intact bilaterally  Hearing is normal as tested by gross conversation Palate elevates midline, normal phonation  Shoulder shrug strength is intact  Tongue protrudes midline   MUSCULOSKELETAL: Tremor: None Bulk: Normal Tone: Normal Facial Symmetry: Face appears grossly symmetrical but possibly with some deviation of the jaw to the right  Posture Forward head and rounded shoulders in sitting;  Cervical Screen Centralization: Deferred Isometrics: Full and painless in all directions Passive Accessory Intervertebral Motion (PAIVM), central and unilateral (bilaterally): Normal mobility  without reproduction of jaw symptoms however pt does report some cervical tenderness. Spurlings A (ipsilateral lateral flexion/axial compression): R: Negative L: Negative Spurlings B (ipsilateral lateral flexion/contralateral rotation/axial compression): R: Negative L: Negative Cervical Distraction Test: Not examined  Cervical Fexion-Rotation Test: Not examined  Hoffman Sign (cervical cord compression): R: Not examined L: Not examined   AROM  AROM (Normal range in degrees) AROM  Cervical  Flexion (50) 50  Extension (80) 38  Right lateral flexion (45) 40*  Left lateral flexion (45) 40*  Right rotation (85)   Left rotation (85)   (* = pain; Blank rows = not  tested)   Dermatome/Myotome Screen N=normal  Ab=abnormal Level Dermatome R L Myotome R L Reflex R L  C2 Posterior Scalp N N Cervical Flexion/Extension C1-2 N N Jaw CN V    C3 Anterior Neck N N Cervical Sidebend C2-3 N N Biceps C5-6    C4 Top of Shoulder N N Shoulder Shrug C4 N N Brachiorad. C5-6    C5 Lateral Upper Arm N N Shoulder ABD C4-5 N N Triceps C7    C6 Lateral Arm/ Thumb N N Arm Flex/ Wrist Ext C5-6 N N     C7 Middle Finger N N Arm Ext//Wrist Flex C6-7 N N     C8 4th & 5th Finger N N Flex/ Ext Carpi Ulnaris C8 N N     T1 Medial Arm N N Interossei T1 N N       Sensation Grossly intact to light touch bilateral face and UE as determined by testing branches of trigeminal nerve and dermatomes C2-T2  Reflexes Not tested  Palpation Graded on 0-4 scale (0 = no pain, 1 = pain, 2 = pain with wincing/grimacing/flinching, 3 = pain with withdrawal, 4 = unwilling to allow palpation) Location LEFT  RIGHT           Temporomandibular Joint (posterior, superior, anterior) 1 0  Temporalis (anterior, middle, posterior fibers) 1 0  Temporalis Tendon Insertion 1 0  Masseter (Zygoma, Body, Lateral surface of angle of mandible) 1 0  Medial ptyergoid 1 1  Frontal Sinus 0 0  Maxillary Sinus 0 0  SCM 1 0  Upper Trapezius 0 0   Subocciptials 0 0    Mandibular AROM Resting Dental Alignment/Occlusion (Overbite, underbite, overjet, crossbite): Lower incisors rest 25mm to the right of upper incisors  Mandible Depression (40-25mm): 41 Mandible Protrusion (3-37mm): 7 Mandible Lateral Excursion (10-55mm): R: 9  L: 13 Audible joint sounds (crepitus or clicking with stethoscope with opening, lateral deviation, and bite): Negative Reciprocal Click (palpation, click with both opening/closing): Positive Deviation of Mouth During Opening: Positive for deviation to the left Deflection of Mouth at End Range: Positive for deviation to the left  Strength R/L Functional Mandible Depression (Jaw Opening): Strong and painless Functional Mandible Protrusion:  Strong and painful on the left Functional Mandible Elevation (Jaw Closing) Strong and painful on the left Functional Mandible Lateral Deviation: Strong and painful on the left with resistance moving bilaterally  Passive Accessory Joint Motion (PAIVM) Deferred  Special Tests Biting on one separator (ipsilateral muscle activation/contralateral joint compression): R: Negative L: Negative Biting on two separators (bilateral muscle activation): R: Negative L: Negative Manual Joint Compression: Positive [name] pain Manual Joint Distraction: Positive for [name] pain  Beighton scale:   LEFT  RIGHT           1. Passive dorsiflexion and hyperextension of the fifth MCP joint beyond 90  0 0   2. Passive apposition of the thumb to the flexor aspect of the forearm  0  0   3. Passive hyperextension of the elbow beyond 10  0  0   4. Passive hyperextension of the knee beyond 10      5. Active forward flexion of the trunk with the knees fully extended so that the palms of the hands rest flat on the floor     TOTAL          MMT  MMT (out of 5) Right 11/20/2021 Left 11/20/2021  Cervical (isometric)  Flexion WNL  Extension WNL  Lateral Flexion WNL  WNL  Rotation WNL WNL       Shoulder   Flexion 5 5* (neck pain)  Extension    Abduction 5 5  Internal rotation    External rotation    Horizontal abduction    Horizontal adduction    Lower Trapezius    Rhomboids        Elbow  Flexion 5 5  Extension 5 5  Pronation    Supination        Wrist  Flexion 5 5  Extension 5 5  Radial deviation    Ulnar deviation        MCP  Flexion 5 5  Extension 5 5  Abduction    Adduction    (* = pain; Blank rows = not tested)   TODAY'S TREATMENT    SUBJECTIVE: Pt states that she is doing alright today. She reports continued [name] pain. She has been performing her HEP some but not consistently.  She reports occipital soreness for 3-4 days after the last therapy session.    PAIN: No resting [name] pain but persistent cervical pain;   Manual Therapy  STM to L masseter (extraoral and intraoral), L SCM, and L temporalis with effleurage and short duration trigger point release; Supine intermittent cervical traction 15s hold/15s relax x 2; Supine upper trap, lateral flexion, and levator stretch x 30s each bilateral; [name] inferior, anterior, and R lateral mobilizations, grade I-II, 20s/bout x 2 bouts each direction; Prone C2-C4, CPA and bilateral UPA mobilizations grade II-III, 20s/bout x 1 bouts each per level each; Prone L C1 lateral mass mobilizations, grade I, 20s/bout x 2 bouts;   Ther-ex  Supine cervical retractions x 10; Supine isometric jaw deviation 6s hold x 6 toward each side; Supine isometric mandibular depression and elevation 6s hold x 6 each; Performed tongue clicks with pt demonstrating proper technique; HEP reviewed and reinforced with patint;   Trigger Point Dry Needling (TDN), unbilled Education previously performed with patient regarding potential benefit of TDN. Previously reviewed precautions and risks with patient. Pt provided verbal consent to treatment. With pt supine using clean technique TDN performed to L masseter, L lateral pterygoid,  and L SCM with 3, 0.25 x 40 single needle placements (one in each muscle) with deep ache and local twitch response. Pt reports some temporalis soreness at end of session.   PATIENT EDUCATION:  Education details: Pt educated throughout session about proper posture and technique with exercises. Improved exercise technique, movement at target joints, use of target muscles after min to mod verbal, visual, tactile cues. Reinforced importance of HEP Person educated: Patient  Education method: Explanation Education comprehension: verbalized understanding and returned demonstration   HOME EXERCISE PROGRAM: Access Code: [email or web address] URL: [email or web address] Date: 11/01/2021 Prepared by: Ria Comment  Exercises - Tongue Clicks TMJ  - 6 x daily - 7 x weekly - 6 reps - Seated Scapular Retraction  - 6 x daily - 7 x weekly - 6 reps - 6s hold - Seated Cervical Retraction  - 6 x daily - 7 x weekly - 6 reps - 6s hold - Isometric Jaw Deviation  - 6 x daily - 7 x weekly - 6 reps - 6s hold - Isometric Jaw Deviation (Mirrored)  - 6 x daily - 7 x weekly - 6 reps - 6s hold  Patient Education - TMJ Parafunctions - Resting Jaw Position   ASSESSMENT:  CLINICAL IMPRESSION: Continued STM to L masseter, SCM, and temporalis. Repeated passive accessory mobilizations of L  TMJ and cervical mobilizations. Repeated TDN but added placement in L temporalis. Added isometric mandibular elevation and depression strengthening. No HEP modifications on this date but pt encouraged to perform her current program consistently. Advised pt to follow-up as scheduled. Plan to progress strengthening at future visits. If symptoms persistent pt plans to discuss a specialty nighttime TMJ oral appliance with her dentist. Pt will benefit from PT services to address deficits in pain and tissue restriction in order to return to full function at home and work with less pain.   REHAB POTENTIAL: Excellent  CLINICAL DECISION  MAKING: Evolving/moderate complexity  EVALUATION COMPLEXITY: Moderate   GOALS: Goals reviewed with patient? Yes  SHORT TERM GOALS: Target date: 11/01/2021  Pt will be independent with HEP to improve strength and decrease TMJ pain to improve pain-free function at home and work. Baseline:  Goal status: INITIAL   LONG TERM GOALS: Target date: 11/29/2021  Pt will increase FOTO to at least 48 in order to demonstrate significant improvement in function at home and work related to TMJ pain Baseline: 10/04/21: To be completed; 10/18/21: 38 Goal status: INITIAL  2.  Pt will decrease worst TMJ pain by at least 3 points on the NPRS in order to demonstrate clinically significant reduction in neck pain. Baseline: 10/04/21: worst: 5/10; 11/08/21: 5/10; Goal status: INITIAL  3.  Pt will decrease NDI score by at least 19% in order demonstrate clinically significant reduction in neck pain/disability.       Baseline: 10/04/21: To be completed Goal status: INITIAL  4.  Pt will report at least 75% improvement in her pain when opening her mouth to eat and while talking at work in order to return to full function without significant increase in pain. Baseline: 11/08/21: Unchanged Goal status: INITIAL   PLAN: PT FREQUENCY: 1-2x/week  PT DURATION: 8 weeks  PLANNED INTERVENTIONS: Therapeutic exercises, Therapeutic activity, Neuromuscular re-education, Balance training, Gait training, Patient/Family education, Joint manipulation, Joint mobilization, Vestibular training, Canalith repositioning, Dry Needling, Electrical stimulation, Spinal manipulation, Spinal mobilization, Cryotherapy, Moist heat, Taping, Traction, Ultrasound, Ionotophoresis 4mg /ml Dexamethasone, and Manual therapy  PLAN FOR NEXT SESSION: NDI, review and modify HEP as needed, progress to isometric strengthening;  Burnham Trost PT, DPT, [facility]  Kashish Yglesias 11/20/2021, 9:15 PM

## 2021-11-22 ENCOUNTER — Other Ambulatory Visit: Payer: Self-pay

## 2021-11-22 ENCOUNTER — Ambulatory Visit: Payer: No Typology Code available for payment source

## 2021-11-22 ENCOUNTER — Ambulatory Visit
Admission: EM | Admit: 2021-11-22 | Discharge: 2021-11-22 | Disposition: A | Discharge status: Home / Self Care | Payer: No Typology Code available for payment source | Attending: Family Medicine | Admitting: Family Medicine

## 2021-11-22 DIAGNOSIS — M542 Cervicalgia: Secondary | ICD-10-CM

## 2021-11-22 DIAGNOSIS — H66002 Acute suppurative otitis media without spontaneous rupture of ear drum, left ear: Secondary | ICD-10-CM

## 2021-11-22 MED ORDER — AMOXICILLIN 875 MG PO TABS
875.0000 mg | ORAL_TABLET | Freq: Two times a day (BID) | ORAL | 0 refills | Status: AC
  Filled 2021-11-22: qty 14, 7d supply, fill #0

## 2021-11-22 NOTE — ED Triage Notes (Signed)
Pt c/o left ear pain x3days sore throat x3days  Pt has been taking OTC ibuprofen and Tussin. Pt last dose was midnight last night. Pt states that it did not help.   Pt states that it feels like an ear infection and she has pain along the face going down to her neck.

## 2021-11-22 NOTE — ED Provider Notes (Signed)
[facility]    CSN: [id number] Arrival date & time: 11/22/21  0802      History   Chief Complaint Chief Complaint  Patient presents with   Ear Pain   Sore Throat          HPI Lacey Rodgers is a 41 y.o. female.   HPI   Lacey Rodgers presents for ear pain for the past couple of days. She took some over-the-counter medications that helped some. She has a sore throat started 3 days ago.  Her youngest has an ear infection. She took a COVID test on Saturday that was negative. No fever, chills, headache, vomiting, diarrhea, nausea, belly or chest pain. Endorses some body aches, mild dry cough.        Past Medical History:  Diagnosis Date   Depression    Genital herpes    GERD (gastroesophageal reflux disease)    History of kidney stones    currently   Hypertension    Migraine    Morbid obesity with BMI of 45.0-49.9, adult [facility])     Patient Active Problem List   Diagnosis Date Noted   HSV-2 (herpes simplex virus 2) infection 02/28/2021   Numbness and tingling in both hands 06/24/2020   BMI 45.0-49.9, adult (HCC) 05/22/2018   Moderate recurrent major depression (HCC) 02/08/2017   Migraine aura, persistent 10/10/2012   GENITAL HERPES 10/23/2008   COMMON MIGRAINE 10/23/2008   Essential hypertension 10/23/2008   GERD 10/23/2008    Past Surgical History:  Procedure Laterality Date   CESAREAN SECTION N/A 12/16/2015   Procedure: CESAREAN SECTION;  Surgeon: Conard Novak, MD;  Location: [facility] ORS;  Service: Obstetrics;  Laterality: N/A;   CESAREAN SECTION WITH BILATERAL TUBAL LIGATION  12/02/2018   Procedure: CESAREAN SECTION WITH BILATERAL TUBAL LIGATION;  Surgeon: Natale Milch, MD;  Location: [facility] ORS;  Service: Obstetrics;;  TOB 1850 WEIGHT 7lb 5oz LENGTH 19.75 APGAR 9/9   DILATION AND CURETTAGE OF UTERUS  01/2013   INSERTION OF MESH  07/18/2021   Procedure: INSERTION OF MESH;  Surgeon: Earline Mayotte, MD;  Location: [facility] ORS;   Service: General;;   VENTRAL HERNIA REPAIR N/A 07/18/2021   Procedure: HERNIA REPAIR VENTRAL ADULT;  Surgeon: Earline Mayotte, MD;  Location: [facility] ORS;  Service: General;  Laterality: N/A;  Sonda Rumble, RNFA to assist    OB History     Gravida  4   Para  2   Term  2   Preterm  0   AB  2   Living  2      SAB      IAB      Ectopic      Multiple  0   Live Births  2            Home Medications    Prior to Admission medications   Medication Sig Start Date End Date Taking? Authorizing Provider  amoxicillin (AMOXIL) 875 MG tablet Take 1 tablet (875 mg total) by mouth 2 (two) times daily for 7 days. 11/22/21 11/29/21 Yes Yury Schaus, DO  buPROPion (WELLBUTRIN SR) 150 MG 12 hr tablet Take 1 tablet (150 mg total) by mouth 2 (two) times daily. Patient taking differently: Take 150 mg by mouth at bedtime. 02/01/21  Yes Copland, Karleen Hampshire, MD  escitalopram (LEXAPRO) 20 MG tablet TAKE 1 TABLET BY MOUTH DAILY. 09/28/21 09/28/22 Yes Copland, Karleen Hampshire, MD  gabapentin (NEURONTIN) 100 MG capsule Follow titration until 3 capsules three times a  day is reached. Patient taking differently: 100-300 mg as needed. 07/19/20  Yes Copland, Karleen Hampshire, MD  lisinopril-hydrochlorothiazide (ZESTORETIC) 10-12.5 MG tablet Take 1 tablet by mouth daily. 10/10/21  Yes Copland, Karleen Hampshire, MD  Multiple Vitamin (MULTIVITAMIN) tablet Take 1 tablet by mouth daily.   Yes [provider]  omeprazole (PRILOSEC) 40 MG capsule Take 1 capsule (40 mg total) by mouth daily. 09/28/21  Yes Copland, Karleen Hampshire, MD  valACYclovir (VALTREX) 1000 MG tablet Take 1 tablet (1,000 mg total) by mouth at bedtime. 09/30/21  Yes Copland, Karleen Hampshire, MD  HYDROcodone-acetaminophen (NORCO/VICODIN) 5-325 MG tablet Take 1 tablet by mouth every 4 (four) hours as needed for moderate pain. 07/18/21 07/18/22  Earline Mayotte, MD  triamcinolone (NASACORT) 55 MCG/ACT AERO nasal inhaler Place into the nose. 08/01/21       Family  History Family History  Problem Relation Age of Onset   Hypertension Mother    Hypertension Father    Hypertension Brother    Cancer Brother 56   Hypertension Maternal Grandmother    Hypertension Maternal Grandfather    Hypertension Paternal Grandmother    Hypertension Paternal Grandfather     Social History Social History   Tobacco Use   Smoking status: Former    Packs/day: 0.25    Years: 6.00    Total pack years: 1.50    Types: Cigarettes   Smokeless tobacco: Never  Vaping Use   Vaping Use: Never used  Substance Use Topics   Alcohol use: Yes    Alcohol/week: 0.0 standard drinks of alcohol    Comment: occ   Drug use: No     Allergies   Pseudoeph-doxylamine-dm-apap   Review of Systems Review of Systems: negative unless otherwise stated in HPI.      Physical Exam Triage Vital Signs ED Triage Vitals  Enc Vitals Group     BP 11/22/21 0815 (!) 145/89     Pulse Rate 11/22/21 0815 83     Resp 11/22/21 0815 18     Temp 11/22/21 0815 98.1 F (36.7 C)     Temp Source 11/22/21 0815 Oral     SpO2 11/22/21 0815 95 %     Weight 11/22/21 0813 240 lb (108.9 kg)     Height 11/22/21 0813 4\' 11"  (1.499 m)     Head Circumference --      Peak Flow --      Pain Score 11/22/21 0813 4     Pain Loc --      Pain Edu? --      Excl. in GC? --    No data found.  Updated Vital Signs BP (!) 145/89 (BP Location: Left Arm)   Pulse 83   Temp 98.1 F (36.7 C) (Oral)   Resp 18   Ht 4\' 11"  (1.499 m)   Wt 108.9 kg   LMP 11/21/2021   SpO2 95%   BMI 48.47 kg/m   Visual Acuity Right Eye Distance:   Left Eye Distance:   Bilateral Distance:    Right Eye Near:   Left Eye Near:    Bilateral Near:     Physical Exam GEN:     alert, non-toxic appearing female in no distress     HENT:  mucus membranes moist, oropharyngeal  without lesions or  exudate, no  tonsillar hypertrophy,   mild oropharyngeal erythema ,   moderate erythematous edematous turbinates,  clear nasal  discharge, left TM erythematous with purulence behind the TM, right TM normal, maxillary sinus tenderness right  worse than left EYES:   pupils equal and reactive, EOMi, no scleral injection NECK:  normal ROM, no lymphadenopathy, no meningismus   RESP:  no increased work of breathing, clear to auscultation bilaterally CVS:   regular rate and rhythm Skin:   warm and dry, no rash on visible skin, normal skin turgor    UC Treatments / Results  Labs (all labs ordered are listed, but only abnormal results are displayed) Labs Reviewed - No data to display  EKG   Radiology No results found.  Procedures Procedures (including critical care time)  Medications Ordered in UC Medications - No data to display  Initial Impression / Assessment and Plan / UC Course  I have reviewed the triage vital signs and the nursing notes.  Pertinent labs & imaging results that were available during my care of the patient were reviewed by me and considered in my medical decision making (see chart for details).       Pt is a 40 y.o. female who presents for 3 of ear pain and sore throat. Cloe is afebrile here without recent antipyretics. Satting well on room air. Overall pt is nontoxic appearing, well hydrated, without respiratory distress. Pulmonary exam is unremarkable.  COVID home test was negative.  Declined influenza test.  Exam concerning for left-sided acute otitis media.  Treat acute otitis media with amoxicillin.  Typical duration of symptoms discussed. Return and ED precautions given and patient voiced understanding  Discussed MDM, treatment plan and plan for follow-up with patient who agrees with plan.     Final Clinical Impressions(s) / UC Diagnoses   Final diagnoses:  Non-recurrent acute suppurative otitis media of left ear without spontaneous rupture of tympanic membrane     Discharge Instructions      Stop by the pharmacy to pick up your prescriptions.  Follow up with your  primary care provider as needed.      ED Prescriptions     Medication Sig Dispense Auth. Provider   amoxicillin (AMOXIL) 875 MG tablet Take 1 tablet (875 mg total) by mouth 2 (two) times daily for 7 days. 14 tablet Tyeisha Dinan, Seward Meth, DO      PDMP not reviewed this encounter.   Katha Cabal, DO 11/22/21 [phone]

## 2021-11-22 NOTE — Discharge Instructions (Signed)
Stop by the pharmacy to pick up your prescriptions.  Follow up with your primary care provider as needed.  

## 2021-11-29 ENCOUNTER — Ambulatory Visit: Payer: No Typology Code available for payment source

## 2021-11-29 DIAGNOSIS — M542 Cervicalgia: Secondary | ICD-10-CM

## 2021-11-29 NOTE — Therapy (Unsigned)
OUTPATIENT PHYSICAL THERAPY TMJ TREATMENT/RECERTIFICATION  Patient Name: Lacey Rodgers MRN: [id number] DOB:[birth year], 41 y.o., female Today's Date: 12/01/2021   PT End of Session - 12/01/21 2105     Visit Number 5    Number of Visits 17    Date for PT Re-Evaluation 01/24/22    Authorization Type eval: 38/4/66, recertification: 59/93/57;    PT Start Time 1309    PT Stop Time 1350    PT Time Calculation (min) 41 min    Activity Tolerance Patient tolerated treatment well    Behavior During Therapy WFL for tasks assessed/performed            Past Medical History:  Diagnosis Date   Depression    Genital herpes    GERD (gastroesophageal reflux disease)    History of kidney stones    currently   Hypertension    Migraine    Morbid obesity with BMI of 45.0-49.9, adult [facility])    Past Surgical History:  Procedure Laterality Date   CESAREAN SECTION N/A 12/16/2015   Procedure: CESAREAN SECTION;  Surgeon: Will Bonnet, MD;  Location: [facility] ORS;  Service: Obstetrics;  Laterality: N/A;   CESAREAN SECTION WITH BILATERAL TUBAL LIGATION  12/02/2018   Procedure: CESAREAN SECTION WITH BILATERAL TUBAL LIGATION;  Surgeon: Homero Fellers, MD;  Location: [facility] ORS;  Service: Obstetrics;;  TOB 1850 WEIGHT 7lb 5oz LENGTH 19.75 APGAR 9/9   DILATION AND CURETTAGE OF UTERUS  01/2013   INSERTION OF MESH  07/18/2021   Procedure: INSERTION OF MESH;  Surgeon: Robert Bellow, MD;  Location: [facility] ORS;  Service: General;;   VENTRAL HERNIA REPAIR N/A 07/18/2021   Procedure: HERNIA REPAIR VENTRAL ADULT;  Surgeon: Robert Bellow, MD;  Location: [facility] ORS;  Service: General;  Laterality: N/A;  Floyce Stakes, RNFA to assist   Patient Active Problem List   Diagnosis Date Noted   HSV-2 (herpes simplex virus 2) infection 02/28/2021   Numbness and tingling in both hands 06/24/2020   BMI 45.0-49.9, adult ([place]) 05/22/2018   Moderate recurrent major depression ([place]) 02/08/2017   Migraine  aura, persistent 10/10/2012   GENITAL HERPES 10/23/2008   COMMON MIGRAINE 10/23/2008   Essential hypertension 10/23/2008   GERD 10/23/2008   PCP: Owens Loffler, MD  REFERRING PROVIDER: Willaim Rayas, [place]  REFERRING DIAGNOSIS: M26.609 (ICD-10-CM) - TMJ dysfunction  THERAPY DIAG: Cervicalgia  RATIONALE FOR EVALUATION AND TREATMENT: Rehabilitation  ONSET DATE: Unknown, ongoing for multiple years  FOLLOW UP APPT WITH PROVIDER: No, not reported   FROM INITIAL EVALUATION (10/04/21) SUBJECTIVE:  Chief Complaint: [name]  Pertinent History Pt reports [name] pain for multiple years which has worsened notably over the last year. No known jaw trauma at onset. Along with the jaw pain she also complains of L ear pain and fullness. She describes the pain as constant and achy. Pt confirms grinding her teeth at night with significant clenching during the day. She notices inflammation occasionally around the [name] when it is really aggravated. She wears a night guard when sleeping and is considering getting an updated one but is concerned that it might be a large out of pocket expense. She gets regular dental examinations and had her last exam approximately 6 months ago. No recent dental procedures or orthodontics. She does not chew gum. Pt reports significant pain with end range depression, eating chewy food, and talking a lot during the day at work.  Last Dental Examination and imaging: Last exam 6 months ago, unsure of last imaging  Recent dental procedures or orthodontics: No  Pain location: [name], masseter, and ear canal Pain Severity: Present: 0-1/10, Best: 0/10, Worst: 5/10 Pain quality: constant and aching  Radiating pain: No Numbness/Tingling: No Aggravating factors: excessive talking,  eating chewy foot, opening mouth wide Easing factors: massage, rest, ibuprofen  Clicking, catching, or crepitus during chewing: Yes 24 hour pain behavior: fluctuates History of grinding teeth: Yes at night as well as frequent clenching during the day. Recent or remote jaw/face/neck trauma, injury, or pain: No Falls: Has patient fallen in last 6 months? No; History of prior physical therapy for this issue: No  Imaging: No, pt is unsure of last dental imaging but she has never had specific imaging for this issue Progression (improving, worsening, unchanged): worsening over the last year History of headaches/migraines: Yes, history of headaches and migraines Ear symptoms (tinnitus, fullness, pain): Yes, L tinnitus (sometimes right), L ear fullness, L ear pain Chest pain: No Stress/anxiety: Yes, pt endorses fatigue and stress related to serving as caregiver to her two young children. Dominant hand: right Occupational demands: Endoscopy technician Hobbies: spending time with family, camping Red flags: Positive: night sweats, Negative: personal history of cancer, chills/fever, nausea, vomiting, unrelenting pain, unexplained weight gain/loss  Precautions: None  Weight Bearing Restrictions: No  Patient Goals: Pt wants to be able to open her mouth as wide as necessary without pain;   OBJECTIVE  Mental Status Patient is oriented to person, place and time.  Recent memory is intact.  Remote memory is intact.  Attention span and concentration are intact.  Expressive speech is intact.  Patient's fund of knowledge is within normal limits for educational level.  Cranial Nerves Visual acuity and visual fields are intact  Extraocular muscles are intact  Facial sensation is intact bilaterally  Facial strength is intact bilaterally  Hearing is normal as tested by gross conversation Palate elevates midline, normal phonation  Shoulder shrug strength is intact  Tongue protrudes midline    MUSCULOSKELETAL: Tremor: None Bulk: Normal Tone: Normal Facial Symmetry: Face appears grossly symmetrical but possibly with some deviation of the jaw to the right  Posture Forward head and rounded shoulders in sitting;  Cervical Screen Centralization: Deferred Isometrics: Full and painless in all directions Passive Accessory Intervertebral Motion (PAIVM), central and unilateral (bilaterally): Normal mobility without reproduction of jaw symptoms however pt does report some cervical tenderness. Spurlings A (ipsilateral lateral flexion/axial compression): R: Negative L: Negative Spurlings B (ipsilateral lateral flexion/contralateral rotation/axial compression): R: Negative L: Negative Cervical Distraction Test: Not examined  Cervical Fexion-Rotation Test: Not examined  Hoffman Sign (cervical cord compression): R: Not examined L: Not examined  AROM AROM (Normal range in degrees) AROM  Cervical  Flexion (50) 50  Extension (80) 38  Right lateral flexion (45) 40*  Left lateral flexion (45) 40*  Right rotation (85)   Left rotation (85)   (* = pain; Blank rows = not tested)  Dermatome/Myotome Screen N=normal  Ab=abnormal Level Dermatome R L Myotome R L Reflex R L  C2 Posterior Scalp N N Cervical Flexion/Extension C1-2 N N Jaw CN V    C3 Anterior Neck N N Cervical Sidebend C2-3 N N Biceps C5-6    C4 Top of Shoulder N N Shoulder Shrug C4 N N Brachiorad. C5-6    C5 Lateral Upper Arm N N Shoulder ABD C4-5 N N Triceps C7    C6 Lateral Arm/ Thumb N N Arm Flex/ Wrist Ext C5-6 N N     C7 Middle Finger N N Arm Ext//Wrist Flex C6-7 N N     C8 4th & 5th Finger N N Flex/ Ext Carpi Ulnaris C8 N N     T1 Medial Arm N N Interossei T1 N N      Sensation Grossly intact to light touch bilateral face and UE as determined by testing branches of trigeminal nerve and dermatomes C2-T2  Reflexes Not tested  Palpation Graded on 0-4 scale (0 = no pain, 1 = pain, 2 = pain with  wincing/grimacing/flinching, 3 = pain with withdrawal, 4 = unwilling to allow palpation) Location LEFT  RIGHT           Temporomandibular Joint (posterior, superior, anterior) 1 0  Temporalis (anterior, middle, posterior fibers) 1 0  Temporalis Tendon Insertion 1 0  Masseter (Zygoma, Body, Lateral surface of angle of mandible) 1 0  Medial ptyergoid 1 1  Frontal Sinus 0 0  Maxillary Sinus 0 0  SCM 1 0  Upper Trapezius 0 0  Subocciptials 0 0   Mandibular AROM Resting Dental Alignment/Occlusion (Overbite, underbite, overjet, crossbite): Lower incisors rest 89m to the right of upper incisors  Mandible Depression (40-59m: 41 Mandible Protrusion (3-74m23m 7 Mandible Lateral Excursion (10-59m72mR: 9  L: 13 Audible joint sounds (crepitus or clicking with stethoscope with opening, lateral deviation, and bite): Negative Reciprocal Click (palpation, click with both opening/closing): Positive Deviation of Mouth During Opening: Positive for deviation to the left Deflection of Mouth at End Range: Positive for deviation to the left  Strength R/L Functional Mandible Depression (Jaw Opening): Strong and painless Functional Mandible Protrusion:  Strong and painful on the left Functional Mandible Elevation (Jaw Closing) Strong and painful on the left Functional Mandible Lateral Deviation: Strong and painful on the left with resistance moving bilaterally  Passive Accessory Joint Motion (PAIVM) Deferred  Special Tests Biting on one separator (ipsilateral muscle activation/contralateral joint compression): R: Negative L: Negative Biting on two separators (bilateral muscle activation): R: Negative L: Negative Manual Joint Compression: Positive [name] pain Manual Joint Distraction: Positive for [name] pain  Beighton scale:   LEFT  RIGHT           1. Passive dorsiflexion and hyperextension of the fifth MCP joint beyond 90  0 0   2. Passive apposition of the thumb to the flexor aspect of the forearm   0  0   3. Passive hyperextension of the elbow beyond 10  0  0   4. Passive hyperextension of the knee beyond 10  5. Active forward flexion of the trunk with the knees fully extended so that the palms of the hands rest flat on the floor     TOTAL          MMT MMT (out of 5) Right 12/01/2021 Left 12/01/2021  Cervical (isometric)  Flexion WNL  Extension WNL  Lateral Flexion WNL WNL  Rotation WNL WNL      Shoulder   Flexion 5 5* (neck pain)  Extension    Abduction 5 5  Internal rotation    External rotation    Horizontal abduction    Horizontal adduction    Lower Trapezius    Rhomboids        Elbow  Flexion 5 5  Extension 5 5  Pronation    Supination        Wrist  Flexion 5 5  Extension 5 5  Radial deviation    Ulnar deviation        MCP  Flexion 5 5  Extension 5 5  Abduction    Adduction    (* = pain; Blank rows = not tested)   TODAY'S TREATMENT    SUBJECTIVE: Pt states that she is doing alright today. She has been performing her HEP and reports at least 30% improvement in her symptoms since starting with therapy. She had prolonged soreness after her last therapy session from the dry needling. She developed a L ear infection since the last therapy session and was unable to have her follow-up appointment with the dentist. Since treating the L ear infection she has had persistent L ear fullness.   PAIN: No resting [name] pain but persistent cervical pain;   Manual Therapy  STM to L masseter (extraoral and intraoral) and L SCM with effleurage and short duration trigger point release; Supine intermittent cervical traction 15s hold/15s relax x 2; Supine upper trap and lateral flexion stretches x 30s each bilateral; [name] inferior, anterior, and R lateral mobilizations, grade I-II, 20s/bout x 2 bouts each direction; Supine C2-C5, CPA mobilizations grade I-II, 20s/bout x 1 bouts each per level each;   Ther-ex  Updated outcome measures with patient: NDI:  26% FOTO: 45 % improvement: 30%  Supine cervical retractions with overpressure by therapist x 10; Supine isometric cervical lateral flexion and rotation 10s hold x 5 each bilateral;   Not performed: Supine isometric jaw deviation 6s hold x 6 toward each side; Supine isometric mandibular depression and elevation 6s hold x 6 each; Performed tongue clicks with pt demonstrating proper technique;   PATIENT EDUCATION:  Education details: Pt educated throughout session about proper posture and technique with exercises. Improved exercise technique, movement at target joints, use of target muscles after min to mod verbal, visual, tactile cues. Outcome measures; Person educated: Patient  Education method: Explanation Education comprehension: verbalized understanding and returned demonstration   HOME EXERCISE PROGRAM: Access Code: [email or web address] URL: [email or web address] Date: 11/01/2021 Prepared by: Roxana Hires  Exercises - Tongue Clicks TMJ  - 6 x daily - 7 x weekly - 6 reps - Seated Scapular Retraction  - 6 x daily - 7 x weekly - 6 reps - 6s hold - Seated Cervical Retraction  - 6 x daily - 7 x weekly - 6 reps - 6s hold - Isometric Jaw Deviation  - 6 x daily - 7 x weekly - 6 reps - 6s hold - Isometric Jaw Deviation (Mirrored)  - 6 x daily - 7 x weekly - 6 reps - 6s hold  Patient  Education - TMJ Parafunctions - Resting Jaw Position   ASSESSMENT:  CLINICAL IMPRESSION: Updated outcome measures with patient during visit today. She reports at least 30% improvement in her symptoms since starting with therapy. Her FOTO improved from 38 to 45. Continued STM to L masseter and SCM during session today. Repeated passive accessory mobilizations of [name] and cervical mobilizations. Deferred TDN as pt experienced prolonged soreness after the last therapy session. No HEP modifications on this date but pt encouraged to continue her current program. Advised pt to follow-up as scheduled.  Plan to progress strengthening at future visits. If symptoms persistent pt plans to discuss a specialty nighttime TMJ oral appliance with her dentist. Pt will benefit from PT services to address deficits in pain and tissue restriction in order to return to full function at home and work with less pain.   REHAB POTENTIAL: Excellent  CLINICAL DECISION MAKING: Evolving/moderate complexity  EVALUATION COMPLEXITY: Moderate   GOALS: Goals reviewed with patient? Yes  SHORT TERM GOALS:   Pt will be independent with HEP to improve strength and decrease TMJ pain to improve pain-free function at home and work. Baseline:  Goal status: ACHIEVED   LONG TERM GOALS: Target date: 01/24/2022  Pt will increase FOTO to at least 48 in order to demonstrate significant improvement in function at home and work related to TMJ pain Baseline: 10/04/21: To be completed; 10/18/21: 38; 11/29/21: 45 Goal status: PARTIALLY MET  2.  Pt will decrease worst TMJ pain by at least 3 points on the NPRS in order to demonstrate clinically significant reduction in neck pain. Baseline: 10/04/21: worst: 5/10; 11/08/21: 5/10; Goal status: ONGOING  3.  Pt will decrease NDI score by at least 19% in order demonstrate clinically significant reduction in neck pain/disability.       Baseline: 10/04/21: To be completed; 11/29/21: 26% Goal status: ONGOING  4.  Pt will report at least 75% improvement in her pain when opening her mouth to eat and while talking at work in order to return to full function without significant increase in pain. Baseline: 11/08/21: Unchanged; 11/29/21: 30% improvement Goal status: PARTIALLY MET   PLAN: PT FREQUENCY: 1-2x/week  PT DURATION: 8 weeks  PLANNED INTERVENTIONS: Therapeutic exercises, Therapeutic activity, Neuromuscular re-education, Balance training, Gait training, Patient/Family education, Joint manipulation, Joint mobilization, Vestibular training, Canalith repositioning, Dry Needling,  Electrical stimulation, Spinal manipulation, Spinal mobilization, Cryotherapy, Moist heat, Taping, Traction, Ultrasound, Ionotophoresis 58m/ml Dexamethasone, and Manual therapy  PLAN FOR NEXT SESSION: review and modify HEP as needed, progress strengthening;  JLyndel SafeHuprich PT, DPT, GCS  Nickol Collister 12/01/2021, 9:15 PM

## 2021-12-06 ENCOUNTER — Ambulatory Visit: Payer: No Typology Code available for payment source | Attending: Student

## 2021-12-06 DIAGNOSIS — M542 Cervicalgia: Secondary | ICD-10-CM | POA: Insufficient documentation

## 2021-12-06 NOTE — Therapy (Signed)
OUTPATIENT PHYSICAL THERAPY TMJ TREATMENT/RECERTIFICATION  Patient Name: Lacey Rodgers MRN: [id number] DOB:December 03, 1980, 41 y.o., female Today's Date: 12/06/2021   PT End of Session - 12/06/21 1318     Visit Number 6    Number of Visits 17    Date for PT Re-Evaluation 01/24/22    Authorization Type eval: 05/08/95, recertification: 94/80/16;    PT Start Time 1318    PT Stop Time 1400    PT Time Calculation (min) 42 min    Activity Tolerance Patient tolerated treatment well    Behavior During Therapy WFL for tasks assessed/performed            Past Medical History:  Diagnosis Date   Depression    Genital herpes    GERD (gastroesophageal reflux disease)    History of kidney stones    currently   Hypertension    Migraine    Morbid obesity with BMI of 45.0-49.9, adult [facility])    Past Surgical History:  Procedure Laterality Date   CESAREAN SECTION N/A 12/16/2015   Procedure: CESAREAN SECTION;  Surgeon: Will Bonnet, MD;  Location: [facility] ORS;  Service: Obstetrics;  Laterality: N/A;   CESAREAN SECTION WITH BILATERAL TUBAL LIGATION  12/02/2018   Procedure: CESAREAN SECTION WITH BILATERAL TUBAL LIGATION;  Surgeon: Homero Fellers, MD;  Location: [facility] ORS;  Service: Obstetrics;;  TOB 1850 WEIGHT 7lb 5oz LENGTH 19.75 APGAR 9/9   DILATION AND CURETTAGE OF UTERUS  01/2013   INSERTION OF MESH  07/18/2021   Procedure: INSERTION OF MESH;  Surgeon: Robert Bellow, MD;  Location: [facility] ORS;  Service: General;;   VENTRAL HERNIA REPAIR N/A 07/18/2021   Procedure: HERNIA REPAIR VENTRAL ADULT;  Surgeon: Robert Bellow, MD;  Location: [facility] ORS;  Service: General;  Laterality: N/A;  Floyce Stakes, RNFA to assist   Patient Active Problem List   Diagnosis Date Noted   HSV-2 (herpes simplex virus 2) infection 02/28/2021   Numbness and tingling in both hands 06/24/2020   BMI 45.0-49.9, adult ([place]) 05/22/2018   Moderate recurrent major depression ([place]) 02/08/2017   Migraine  aura, persistent 10/10/2012   GENITAL HERPES 10/23/2008   COMMON MIGRAINE 10/23/2008   Essential hypertension 10/23/2008   GERD 10/23/2008   PCP: Owens Loffler, MD  REFERRING PROVIDER: Willaim Rayas, [place]  REFERRING DIAGNOSIS: M26.609 (ICD-10-CM) - TMJ dysfunction  THERAPY DIAG: Cervicalgia  RATIONALE FOR EVALUATION AND TREATMENT: Rehabilitation  ONSET DATE: Unknown, ongoing for multiple years  FOLLOW UP APPT WITH PROVIDER: No, not reported   FROM INITIAL EVALUATION (10/04/21) SUBJECTIVE:  Chief Complaint: [name]  Pertinent History Pt reports [name] pain for multiple years which has worsened notably over the last year. No known jaw trauma at onset. Along with the jaw pain she also complains of L ear pain and fullness. She describes the pain as constant and achy. Pt confirms grinding her teeth at night with significant clenching during the day. She notices inflammation occasionally around the [name] when it is really aggravated. She wears a night guard when sleeping and is considering getting an updated one but is concerned that it might be a large out of pocket expense. She gets regular dental examinations and had her last exam approximately 6 months ago. No recent dental procedures or orthodontics. She does not chew gum. Pt reports significant pain with end range depression, eating chewy food, and talking a lot during the day at work.  Last Dental Examination and imaging: Last exam 6 months ago, unsure of last imaging  Recent dental procedures or orthodontics: No  Pain location: [name], masseter, and ear canal Pain Severity: Present: 0-1/10, Best: 0/10, Worst: 5/10 Pain quality: constant and aching  Radiating pain: No Numbness/Tingling: No Aggravating factors: excessive talking,  eating chewy foot, opening mouth wide Easing factors: massage, rest, ibuprofen  Clicking, catching, or crepitus during chewing: Yes 24 hour pain behavior: fluctuates History of grinding teeth: Yes at night as well as frequent clenching during the day. Recent or remote jaw/face/neck trauma, injury, or pain: No Falls: Has patient fallen in last 6 months? No; History of prior physical therapy for this issue: No  Imaging: No, pt is unsure of last dental imaging but she has never had specific imaging for this issue Progression (improving, worsening, unchanged): worsening over the last year History of headaches/migraines: Yes, history of headaches and migraines Ear symptoms (tinnitus, fullness, pain): Yes, L tinnitus (sometimes right), L ear fullness, L ear pain Chest pain: No Stress/anxiety: Yes, pt endorses fatigue and stress related to serving as caregiver to her two young children. Dominant hand: right Occupational demands: Endoscopy technician Hobbies: spending time with family, camping Red flags: Positive: night sweats, Negative: personal history of cancer, chills/fever, nausea, vomiting, unrelenting pain, unexplained weight gain/loss  Precautions: None  Weight Bearing Restrictions: No  Patient Goals: Pt wants to be able to open her mouth as wide as necessary without pain;   OBJECTIVE  Mental Status Patient is oriented to person, place and time.  Recent memory is intact.  Remote memory is intact.  Attention span and concentration are intact.  Expressive speech is intact.  Patient's fund of knowledge is within normal limits for educational level.  Cranial Nerves Visual acuity and visual fields are intact  Extraocular muscles are intact  Facial sensation is intact bilaterally  Facial strength is intact bilaterally  Hearing is normal as tested by gross conversation Palate elevates midline, normal phonation  Shoulder shrug strength is intact  Tongue protrudes midline    MUSCULOSKELETAL: Tremor: None Bulk: Normal Tone: Normal Facial Symmetry: Face appears grossly symmetrical but possibly with some deviation of the jaw to the right  Posture Forward head and rounded shoulders in sitting;  Cervical Screen Centralization: Deferred Isometrics: Full and painless in all directions Passive Accessory Intervertebral Motion (PAIVM), central and unilateral (bilaterally): Normal mobility without reproduction of jaw symptoms however pt does report some cervical tenderness. Spurlings A (ipsilateral lateral flexion/axial compression): R: Negative L: Negative Spurlings B (ipsilateral lateral flexion/contralateral rotation/axial compression): R: Negative L: Negative Cervical Distraction Test: Not examined  Cervical Fexion-Rotation Test: Not examined  Hoffman Sign (cervical cord compression): R: Not examined L: Not examined  AROM AROM (Normal range in degrees) AROM  Cervical  Flexion (50) 50  Extension (80) 38  Right lateral flexion (45) 40*  Left lateral flexion (45) 40*  Right rotation (85)   Left rotation (85)   (* = pain; Blank rows = not tested)  Dermatome/Myotome Screen N=normal  Ab=abnormal Level Dermatome R L Myotome R L Reflex R L  C2 Posterior Scalp N N Cervical Flexion/Extension C1-2 N N Jaw CN V    C3 Anterior Neck N N Cervical Sidebend C2-3 N N Biceps C5-6    C4 Top of Shoulder N N Shoulder Shrug C4 N N Brachiorad. C5-6    C5 Lateral Upper Arm N N Shoulder ABD C4-5 N N Triceps C7    C6 Lateral Arm/ Thumb N N Arm Flex/ Wrist Ext C5-6 N N     C7 Middle Finger N N Arm Ext//Wrist Flex C6-7 N N     C8 4th & 5th Finger N N Flex/ Ext Carpi Ulnaris C8 N N     T1 Medial Arm N N Interossei T1 N N      Sensation Grossly intact to light touch bilateral face and UE as determined by testing branches of trigeminal nerve and dermatomes C2-T2  Reflexes Not tested  Palpation Graded on 0-4 scale (0 = no pain, 1 = pain, 2 = pain with  wincing/grimacing/flinching, 3 = pain with withdrawal, 4 = unwilling to allow palpation) Location LEFT  RIGHT           Temporomandibular Joint (posterior, superior, anterior) 1 0  Temporalis (anterior, middle, posterior fibers) 1 0  Temporalis Tendon Insertion 1 0  Masseter (Zygoma, Body, Lateral surface of angle of mandible) 1 0  Medial ptyergoid 1 1  Frontal Sinus 0 0  Maxillary Sinus 0 0  SCM 1 0  Upper Trapezius 0 0  Subocciptials 0 0   Mandibular AROM Resting Dental Alignment/Occlusion (Overbite, underbite, overjet, crossbite): Lower incisors rest 3m to the right of upper incisors  Mandible Depression (40-539m: 41 Mandible Protrusion (3-69m69m 7 Mandible Lateral Excursion (10-62m33mR: 9  L: 13 Audible joint sounds (crepitus or clicking with stethoscope with opening, lateral deviation, and bite): Negative Reciprocal Click (palpation, click with both opening/closing): Positive Deviation of Mouth During Opening: Positive for deviation to the left Deflection of Mouth at End Range: Positive for deviation to the left  Strength R/L Functional Mandible Depression (Jaw Opening): Strong and painless Functional Mandible Protrusion:  Strong and painful on the left Functional Mandible Elevation (Jaw Closing) Strong and painful on the left Functional Mandible Lateral Deviation: Strong and painful on the left with resistance moving bilaterally  Passive Accessory Joint Motion (PAIVM) Deferred  Special Tests Biting on one separator (ipsilateral muscle activation/contralateral joint compression): R: Negative L: Negative Biting on two separators (bilateral muscle activation): R: Negative L: Negative Manual Joint Compression: Positive [name] pain Manual Joint Distraction: Positive for [name] pain  Beighton scale:   LEFT  RIGHT           1. Passive dorsiflexion and hyperextension of the fifth MCP joint beyond 90  0 0   2. Passive apposition of the thumb to the flexor aspect of the forearm   0  0   3. Passive hyperextension of the elbow beyond 10  0  0   4. Passive hyperextension of the knee beyond 10  5. Active forward flexion of the trunk with the knees fully extended so that the palms of the hands rest flat on the floor     TOTAL          MMT MMT (out of 5) Right 12/06/2021 Left 12/06/2021  Cervical (isometric)  Flexion WNL  Extension WNL  Lateral Flexion WNL WNL  Rotation WNL WNL      Shoulder   Flexion 5 5* (neck pain)  Extension    Abduction 5 5  Internal rotation    External rotation    Horizontal abduction    Horizontal adduction    Lower Trapezius    Rhomboids        Elbow  Flexion 5 5  Extension 5 5  Pronation    Supination        Wrist  Flexion 5 5  Extension 5 5  Radial deviation    Ulnar deviation        MCP  Flexion 5 5  Extension 5 5  Abduction    Adduction    (* = pain; Blank rows = not tested)   TODAY'S TREATMENT    SUBJECTIVE: Pt states that she is doing alright today. She has been performing her HEP and reports at least 30% improvement in her symptoms since starting with therapy. She had prolonged soreness after her last therapy session from the dry needling. She developed a L ear infection since the last therapy session and was unable to have her follow-up appointment with the dentist. Since treating the L ear infection she has had persistent L ear fullness.   PAIN: No resting [name] pain but persistent cervical pain;   Manual Therapy  STM to L masseter (extraoral and intraoral) and L SCM with effleurage and short duration trigger point release; Supine intermittent cervical traction 15s hold/15s relax x 2; Supine upper trap and lateral flexion stretches x 30s each bilateral; [name] inferior, anterior, and R lateral mobilizations, grade I-II, 20s/bout x 2 bouts each direction; Supine C2-C5, CPA mobilizations grade I-II, 20s/bout x 1 bouts each per level each;   Ther-ex  Updated outcome measures with patient: NDI:  26% FOTO: 45 % improvement: 30%  Supine cervical retractions with overpressure by therapist x 10; Supine isometric cervical lateral flexion and rotation 10s hold x 5 each bilateral;   Not performed: Supine isometric jaw deviation 6s hold x 6 toward each side; Supine isometric mandibular depression and elevation 6s hold x 6 each; Performed tongue clicks with pt demonstrating proper technique;   PATIENT EDUCATION:  Education details: Pt educated throughout session about proper posture and technique with exercises. Improved exercise technique, movement at target joints, use of target muscles after min to mod verbal, visual, tactile cues. Outcome measures; Person educated: Patient  Education method: Explanation Education comprehension: verbalized understanding and returned demonstration   HOME EXERCISE PROGRAM: Access Code: [email or web address] URL: [email or web address] Date: 11/01/2021 Prepared by: Roxana Hires  Exercises - Tongue Clicks TMJ  - 6 x daily - 7 x weekly - 6 reps - Seated Scapular Retraction  - 6 x daily - 7 x weekly - 6 reps - 6s hold - Seated Cervical Retraction  - 6 x daily - 7 x weekly - 6 reps - 6s hold - Isometric Jaw Deviation  - 6 x daily - 7 x weekly - 6 reps - 6s hold - Isometric Jaw Deviation (Mirrored)  - 6 x daily - 7 x weekly - 6 reps - 6s hold  Patient  Education - TMJ Parafunctions - Resting Jaw Position   ASSESSMENT:  CLINICAL IMPRESSION: Updated outcome measures with patient during visit today. She reports at least 30% improvement in her symptoms since starting with therapy. Her FOTO improved from 38 to 45. Continued STM to L masseter and SCM during session today. Repeated passive accessory mobilizations of [name] and cervical mobilizations. Deferred TDN as pt experienced prolonged soreness after the last therapy session. No HEP modifications on this date but pt encouraged to continue her current program. Advised pt to follow-up as scheduled.  Plan to progress strengthening at future visits. If symptoms persistent pt plans to discuss a specialty nighttime TMJ oral appliance with her dentist. Pt will benefit from PT services to address deficits in pain and tissue restriction in order to return to full function at home and work with less pain.   REHAB POTENTIAL: Excellent  CLINICAL DECISION MAKING: Evolving/moderate complexity  EVALUATION COMPLEXITY: Moderate   GOALS: Goals reviewed with patient? Yes  SHORT TERM GOALS:   Pt will be independent with HEP to improve strength and decrease TMJ pain to improve pain-free function at home and work. Baseline:  Goal status: ACHIEVED   LONG TERM GOALS: Target date: 01/24/2022  Pt will increase FOTO to at least 48 in order to demonstrate significant improvement in function at home and work related to TMJ pain Baseline: 10/04/21: To be completed; 10/18/21: 38; 11/29/21: 45 Goal status: PARTIALLY MET  2.  Pt will decrease worst TMJ pain by at least 3 points on the NPRS in order to demonstrate clinically significant reduction in neck pain. Baseline: 10/04/21: worst: 5/10; 11/08/21: 5/10; Goal status: ONGOING  3.  Pt will decrease NDI score by at least 19% in order demonstrate clinically significant reduction in neck pain/disability.       Baseline: 10/04/21: To be completed; 11/29/21: 26% Goal status: ONGOING  4.  Pt will report at least 75% improvement in her pain when opening her mouth to eat and while talking at work in order to return to full function without significant increase in pain. Baseline: 11/08/21: Unchanged; 11/29/21: 30% improvement Goal status: PARTIALLY MET   PLAN: PT FREQUENCY: 1-2x/week  PT DURATION: 8 weeks  PLANNED INTERVENTIONS: Therapeutic exercises, Therapeutic activity, Neuromuscular re-education, Balance training, Gait training, Patient/Family education, Joint manipulation, Joint mobilization, Vestibular training, Canalith repositioning, Dry Needling,  Electrical stimulation, Spinal manipulation, Spinal mobilization, Cryotherapy, Moist heat, Taping, Traction, Ultrasound, Ionotophoresis 17m/ml Dexamethasone, and Manual therapy  PLAN FOR NEXT SESSION: review and modify HEP as needed, progress strengthening;  JLyndel SafeHuprich PT, DPT, GCS  Moo Gravley 12/06/2021, 2:03 PM

## 2021-12-13 ENCOUNTER — Ambulatory Visit: Payer: No Typology Code available for payment source
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
## 2021-12-20 ENCOUNTER — Ambulatory Visit: Payer: No Typology Code available for payment source

## 2021-12-20 DIAGNOSIS — M542 Cervicalgia: Secondary | ICD-10-CM

## 2021-12-21 ENCOUNTER — Other Ambulatory Visit: Payer: Self-pay

## 2021-12-21 ENCOUNTER — Other Ambulatory Visit: Payer: Self-pay | Admitting: Family Medicine

## 2021-12-21 MED ORDER — OMEPRAZOLE 40 MG PO CPDR
40.0000 mg | DELAYED_RELEASE_CAPSULE | Freq: Every day | ORAL | 3 refills | Status: DC
  Filled 2021-12-21 – 2022-01-11 (×2): qty 90, 90d supply, fill #0
  Filled 2022-05-02: qty 90, 90d supply, fill #1
  Filled 2022-05-22 – 2022-08-14 (×2): qty 90, 90d supply, fill #2
  Filled 2022-10-22 – 2022-10-25 (×2): qty 90, 90d supply, fill #3

## 2021-12-23 ENCOUNTER — Other Ambulatory Visit: Payer: Self-pay
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
## 2021-12-27 ENCOUNTER — Ambulatory Visit: Payer: No Typology Code available for payment source

## 2021-12-27 ENCOUNTER — Other Ambulatory Visit: Payer: Self-pay

## 2021-12-27 DIAGNOSIS — M542 Cervicalgia: Secondary | ICD-10-CM

## 2022-01-10 ENCOUNTER — Ambulatory Visit: Payer: 59 | Attending: Student

## 2022-01-10 ENCOUNTER — Other Ambulatory Visit: Payer: Self-pay

## 2022-01-10 DIAGNOSIS — M542 Cervicalgia: Secondary | ICD-10-CM | POA: Insufficient documentation

## 2022-01-10 DIAGNOSIS — G8929 Other chronic pain: Secondary | ICD-10-CM

## 2022-01-10 DIAGNOSIS — M545 Low back pain, unspecified: Secondary | ICD-10-CM | POA: Insufficient documentation

## 2022-01-10 DIAGNOSIS — M546 Pain in thoracic spine: Secondary | ICD-10-CM | POA: Insufficient documentation

## 2022-01-10 NOTE — Therapy (Signed)
OUTPATIENT PHYSICAL THERAPY TMJ TREATMENT  Patient Name: Lacey Rodgers MRN: [id number] DOB:[birth year], 42 y.o., female Today's Date: 01/10/2022   PT End of Session - 01/10/22 0854     Visit Number 8    Number of Visits 17    Date for PT Re-Evaluation 01/24/22    Authorization Type eval: [id number], recertification: [id number];    PT Start Time 0853    PT Stop Time [id number]    PT Time Calculation (min) 45 min    Activity Tolerance Patient tolerated treatment well    Behavior During Therapy [facility] for tasks assessed/performed            Past Medical History:  Diagnosis Date   Depression    Genital herpes    GERD (gastroesophageal reflux disease)    History of kidney stones    currently   Hypertension    Migraine    Morbid obesity with BMI of 45.0-49.9, adult [facility])    Past Surgical History:  Procedure Laterality Date   CESAREAN SECTION N/A 12/16/2015   Procedure: CESAREAN SECTION;  Surgeon: Will Bonnet, MD;  Location: [facility] ORS;  Service: Obstetrics;  Laterality: N/A;   CESAREAN SECTION WITH BILATERAL TUBAL LIGATION  12/02/2018   Procedure: CESAREAN SECTION WITH BILATERAL TUBAL LIGATION;  Surgeon: Homero Fellers, MD;  Location: [facility] ORS;  Service: Obstetrics;;  TOB 1850 WEIGHT 7lb 5oz LENGTH 19.75 APGAR 9/9   DILATION AND CURETTAGE OF UTERUS  01/2013   INSERTION OF MESH  07/18/2021   Procedure: INSERTION OF MESH;  Surgeon: Robert Bellow, MD;  Location: [facility] ORS;  Service: General;;   VENTRAL HERNIA REPAIR N/A 07/18/2021   Procedure: HERNIA REPAIR VENTRAL ADULT;  Surgeon: Robert Bellow, MD;  Location: [facility] ORS;  Service: General;  Laterality: N/A;  Floyce Stakes, RNFA to assist   Patient Active Problem List   Diagnosis Date Noted   HSV-2 (herpes simplex virus 2) infection 02/28/2021   Numbness and tingling in both hands 06/24/2020   BMI 45.0-49.9, adult ([place]) 05/22/2018   Moderate recurrent major depression ([place]) 02/08/2017   Migraine aura, persistent  10/10/2012   GENITAL HERPES 10/23/2008   COMMON MIGRAINE 10/23/2008   Essential hypertension 10/23/2008   GERD 10/23/2008   PCP: Owens Loffler, MD  REFERRING PROVIDER: Willaim Rayas, [place]  REFERRING DIAGNOSIS: M26.609 (ICD-10-CM) - TMJ dysfunction  THERAPY DIAG: Cervicalgia  Chronic midline low back pain without sciatica  RATIONALE FOR EVALUATION AND TREATMENT: Rehabilitation  ONSET DATE: Unknown, ongoing for multiple years  FOLLOW UP APPT WITH PROVIDER: No, not reported  FROM INITIAL EVALUATION (10/04/21) SUBJECTIVE:  Chief Complaint: [name]  Pertinent History Pt reports [name] pain for multiple years which has worsened notably over the last year. No known jaw trauma at onset. Along with the jaw pain she also complains of L ear pain and fullness. She describes the pain as constant and achy. Pt confirms grinding her teeth at night with significant clenching during the day. She notices inflammation occasionally around the [name] when it is really aggravated. She wears a night guard when sleeping and is considering getting an updated one but is concerned that it might be a large out of pocket expense. She gets regular dental examinations and had her last exam approximately 6 months ago. No recent dental procedures or orthodontics. She does not chew gum. Pt reports significant pain with end range depression, eating chewy food, and talking a lot during the day at work.  Last Dental Examination and imaging: Last exam 6 months ago, unsure of last imaging  Recent dental procedures or orthodontics: No  Pain location: [name], masseter, and ear canal Pain Severity: Present: 0-1/10, Best: 0/10, Worst: 5/10 Pain quality: constant and aching  Radiating pain: No Numbness/Tingling: No Aggravating  factors: excessive talking, eating chewy foot, opening mouth wide Easing factors: massage, rest, ibuprofen  Clicking, catching, or crepitus during chewing: Yes 24 hour pain behavior: fluctuates History of grinding teeth: Yes at night as well as frequent clenching during the day. Recent or remote jaw/face/neck trauma, injury, or pain: No Falls: Has patient fallen in last 6 months? No; History of prior physical therapy for this issue: No  Imaging: No, pt is unsure of last dental imaging but she has never had specific imaging for this issue Progression (improving, worsening, unchanged): worsening over the last year History of headaches/migraines: Yes, history of headaches and migraines Ear symptoms (tinnitus, fullness, pain): Yes, L tinnitus (sometimes right), L ear fullness, L ear pain Chest pain: No Stress/anxiety: Yes, pt endorses fatigue and stress related to serving as caregiver to her two young children. Dominant hand: right Occupational demands: Endoscopy technician Hobbies: spending time with family, camping Red flags: Positive: night sweats, Negative: personal history of cancer, chills/fever, nausea, vomiting, unrelenting pain, unexplained weight gain/loss  Precautions: None  Weight Bearing Restrictions: No  Patient Goals: Pt wants to be able to open her mouth as wide as necessary without pain;  OBJECTIVE  Mental Status Patient is oriented to person, place and time.  Recent memory is intact.  Remote memory is intact.  Attention span and concentration are intact.  Expressive speech is intact.  Patient's fund of knowledge is within normal limits for educational level.  Cranial Nerves Visual acuity and visual fields are intact  Extraocular muscles are intact  Facial sensation is intact bilaterally  Facial strength is intact bilaterally  Hearing is normal as tested by gross conversation Palate elevates midline, normal phonation  Shoulder shrug strength is intact  Tongue  protrudes midline   MUSCULOSKELETAL: Tremor: None Bulk: Normal Tone: Normal Facial Symmetry: Face appears grossly symmetrical but possibly with some deviation of the jaw to the right  Posture Forward head and rounded shoulders in sitting;  Cervical Screen Centralization: Deferred Isometrics: Full and painless in all directions Passive Accessory Intervertebral Motion (PAIVM), central and unilateral (bilaterally): Normal mobility without reproduction of jaw symptoms however pt does report some cervical tenderness. Spurlings A (ipsilateral lateral flexion/axial compression): R: Negative L: Negative Spurlings B (ipsilateral lateral flexion/contralateral rotation/axial compression): R: Negative L: Negative Cervical Distraction Test: Not examined  Cervical Fexion-Rotation Test: Not examined  Hoffman Sign (cervical cord compression): R: Not examined L: Not examined  AROM AROM (Normal range in degrees) AROM  Cervical  Flexion (50) 50  Extension (80) 38  Right lateral flexion (45) 40*  Left lateral flexion (45) 40*  Right rotation (85)   Left rotation (85)   (* = pain; Blank rows = not tested)  Dermatome/Myotome Screen N=normal  Ab=abnormal Level Dermatome R L Myotome R L Reflex R L  C2 Posterior Scalp N N Cervical Flexion/Extension C1-2 N N Jaw CN V    C3 Anterior Neck N N Cervical Sidebend C2-3 N N Biceps C5-6    C4 Top of Shoulder N N Shoulder Shrug C4 N N Brachiorad. C5-6    C5 Lateral Upper Arm N N Shoulder ABD C4-5 N N Triceps C7    C6 Lateral Arm/ Thumb N N Arm Flex/ Wrist Ext C5-6 N N     C7 Middle Finger N N Arm Ext//Wrist Flex C6-7 N N     C8 4th & 5th Finger N N Flex/ Ext Carpi Ulnaris C8 N N     T1 Medial Arm N N Interossei T1 N N      Sensation Grossly intact to light touch bilateral face and UE as determined by testing branches of trigeminal nerve and dermatomes C2-T2  Reflexes Not tested  Palpation Graded on 0-4 scale (0 = no pain, 1 = pain, 2 = pain with  wincing/grimacing/flinching, 3 = pain with withdrawal, 4 = unwilling to allow palpation) Location LEFT  RIGHT           Temporomandibular Joint (posterior, superior, anterior) 1 0  Temporalis (anterior, middle, posterior fibers) 1 0  Temporalis Tendon Insertion 1 0  Masseter (Zygoma, Body, Lateral surface of angle of mandible) 1 0  Medial ptyergoid 1 1  Frontal Sinus 0 0  Maxillary Sinus 0 0  SCM 1 0  Upper Trapezius 0 0  Subocciptials 0 0   Mandibular AROM Resting Dental Alignment/Occlusion (Overbite, underbite, overjet, crossbite): Lower incisors rest 89m to the right of upper incisors  Mandible Depression (40-59m: 41 Mandible Protrusion (3-74m23m 7 Mandible Lateral Excursion (10-59m72mR: 9  L: 13 Audible joint sounds (crepitus or clicking with stethoscope with opening, lateral deviation, and bite): Negative Reciprocal Click (palpation, click with both opening/closing): Positive Deviation of Mouth During Opening: Positive for deviation to the left Deflection of Mouth at End Range: Positive for deviation to the left  Strength R/L Functional Mandible Depression (Jaw Opening): Strong and painless Functional Mandible Protrusion:  Strong and painful on the left Functional Mandible Elevation (Jaw Closing) Strong and painful on the left Functional Mandible Lateral Deviation: Strong and painful on the left with resistance moving bilaterally  Passive Accessory Joint Motion (PAIVM) Deferred  Special Tests Biting on one separator (ipsilateral muscle activation/contralateral joint compression): R: Negative L: Negative Biting on two separators (bilateral muscle activation): R: Negative L: Negative Manual Joint Compression: Positive [name] pain Manual Joint Distraction: Positive for [name] pain  Beighton scale:   LEFT  RIGHT           1. Passive dorsiflexion and hyperextension of the fifth MCP joint beyond 90  0 0   2. Passive apposition of the thumb to the flexor aspect of the forearm   0  0   3. Passive hyperextension of the elbow beyond 10  0  0   4. Passive hyperextension of the knee beyond 10  5. Active forward flexion of the trunk with the knees fully extended so that the palms of the hands rest flat on the floor     TOTAL          MMT MMT (out of 5) Right 01/10/2022 Left 01/10/2022  Cervical (isometric)  Flexion WNL  Extension WNL  Lateral Flexion WNL WNL  Rotation WNL WNL      Shoulder   Flexion 5 5* (neck pain)  Extension    Abduction 5 5  Internal rotation    External rotation    Horizontal abduction    Horizontal adduction    Lower Trapezius    Rhomboids        Elbow  Flexion 5 5  Extension 5 5  Pronation    Supination        Wrist  Flexion 5 5  Extension 5 5  Radial deviation    Ulnar deviation        MCP  Flexion 5 5  Extension 5 5  Abduction    Adduction    (* = pain; Blank rows = not tested)   TODAY'S TREATMENT    SUBJECTIVE: Pt reports that she woke up with a spasm in the R side of her neck this morning. She had the flu last week and had full body aches including her bilateral neck and jaw pain. Symptoms have improved over the last couple days.   PAIN: R sided neck pain, persistent [name] pain.    Manual Therapy  STM to L upper trap, levator, suboccipitals, and bilateral masseter (internal and external) using effleurage and short duration trigger point release; Supine upper trap and lateral flexion stretches x 45s each bilateral; Prone C2-T3, CPA mobilizations grade I-II, 30s/bout x 1 bout at each level; Prone C2-C5, UPA mobilizations grade I-II, 30s/bout x 1 bouts each per level bilaterally; TMJ distraction, grade III, 30s/bout x 2 bouts each bilaterally;   Ther-ex  Supine cervical retractions with overpressure from therapist x 10; Supine manually resisted cervical rotation x 5 bilaterally;   Trigger Point Dry Needling (TDN), unbilled Education previoulsy performed with patient regarding potential benefit of TDN.  Pt provided verbal consent to treatment. In prone using clean technique TDN performed to bilateral upper traps with 4, 0.25 x 40 single needle placements (2 on each side) with local twitch response (LTR) during all placements. Pistoning technique utilized. Improved pain-free cervical rotation following intervention however pt continues to report neck pain with right rotation.     Not performed: Supine isometric jaw deviation 6s hold x 6 toward each side; Tongue clicks with pt demonstrating proper technique; Supine manually resisted mandibular depression 3s hold x 10; [name] anterior, and R lateral mobilizations, grade I-II, 20s/bout x 2 bouts each direction;   PATIENT EDUCATION:  Education details: Pt educated throughout session about proper posture and technique with exercises. Improved exercise technique, movement at target joints, use of target muscles after min to mod verbal, visual, tactile cues.  Person educated: Patient  Education method: Explanation Education comprehension: verbalized understanding and returned demonstration   HOME EXERCISE PROGRAM: Access Code: [email or web address] URL: [email or web address] Date: 11/01/2021 Prepared by: Roxana Hires  Exercises - Tongue Clicks TMJ  - 6 x daily - 7 x weekly - 6 reps - Seated Scapular Retraction  - 6 x daily - 7 x weekly - 6 reps - 6s hold - Seated Cervical Retraction  - 6 x daily - 7 x weekly - 6 reps - 6s hold - Isometric  Jaw Deviation  - 6 x daily - 7 x weekly - 6 reps - 6s hold - Isometric Jaw Deviation (Mirrored)  - 6 x daily - 7 x weekly - 6 reps - 6s hold  Patient Education - TMJ Parafunctions - Resting Jaw Position   ASSESSMENT:  CLINICAL IMPRESSION: Session today focused on neck pain mostly again today which is chronic but pt reporting acute muscle spasm upon waking this morning. However also spent time addressing [name] during session. Repeated trigger point dry needling as it was very helpful after last  session. No HEP modifications on this date but pt encouraged to continue her current program. Advised pt to follow-up as scheduled. If symptoms persistent pt plans to discuss a specialty nighttime TMJ oral appliance with her dentist. Pt will benefit from PT services to address deficits in pain and tissue restriction in order to return to full function at home and work with less pain.   REHAB POTENTIAL: Excellent  CLINICAL DECISION MAKING: Evolving/moderate complexity  EVALUATION COMPLEXITY: Moderate   GOALS: Goals reviewed with patient? Yes  SHORT TERM GOALS:   Pt will be independent with HEP to improve strength and decrease TMJ pain to improve pain-free function at home and work. Baseline:  Goal status: ACHIEVED   LONG TERM GOALS: Target date: 01/24/2022  Pt will increase FOTO to at least 48 in order to demonstrate significant improvement in function at home and work related to TMJ pain Baseline: 10/04/21: To be completed; 10/18/21: 38; 11/29/21: 45 Goal status: PARTIALLY MET  2.  Pt will decrease worst TMJ pain by at least 3 points on the NPRS in order to demonstrate clinically significant reduction in neck pain. Baseline: 10/04/21: worst: 5/10; 11/08/21: 5/10; Goal status: ONGOING  3.  Pt will decrease NDI score by at least 19% in order demonstrate clinically significant reduction in neck pain/disability.       Baseline: 10/04/21: To be completed; 11/29/21: 26% Goal status: ONGOING  4.  Pt will report at least 75% improvement in her pain when opening her mouth to eat and while talking at work in order to return to full function without significant increase in pain. Baseline: 11/08/21: Unchanged; 11/29/21: 30% improvement Goal status: PARTIALLY MET   PLAN: PT FREQUENCY: 1-2x/week  PT DURATION: 8 weeks  PLANNED INTERVENTIONS: Therapeutic exercises, Therapeutic activity, Neuromuscular re-education, Balance training, Gait training, Patient/Family education, Joint manipulation,  Joint mobilization, Vestibular training, Canalith repositioning, Dry Needling, Electrical stimulation, Spinal manipulation, Spinal mobilization, Cryotherapy, Moist heat, Taping, Traction, Ultrasound, Ionotophoresis 4mg /ml Dexamethasone, and Manual therapy  PLAN FOR NEXT SESSION: measure cervical rotation, review and modify HEP as needed, progress strengthening;  Brand Siever PT, DPT, GCS  Havard Radigan 01/10/2022, 1:00 PM

## 2022-01-11 ENCOUNTER — Other Ambulatory Visit: Payer: Self-pay | Admitting: Family Medicine

## 2022-01-11 ENCOUNTER — Other Ambulatory Visit: Payer: Self-pay

## 2022-01-11 DIAGNOSIS — F419 Anxiety disorder, unspecified: Secondary | ICD-10-CM

## 2022-01-11 MED ORDER — ESCITALOPRAM OXALATE 20 MG PO TABS
20.0000 mg | ORAL_TABLET | Freq: Every day | ORAL | 1 refills | Status: DC
  Filled 2022-01-11: qty 90, 90d supply, fill #0
  Filled 2022-05-02: qty 90, 90d supply, fill #1

## 2022-01-11 MED ORDER — BUPROPION HCL ER (SR) 150 MG PO TB12
150.0000 mg | ORAL_TABLET | Freq: Two times a day (BID) | ORAL | 1 refills | Status: DC
  Filled 2022-01-11: qty 180, 90d supply, fill #0
  Filled 2022-05-02: qty 180, 90d supply, fill #1

## 2022-01-11 MED FILL — Valacyclovir HCl Tab 1 GM: ORAL | 90 days supply | Qty: 90 | Fill #1 | Status: AC

## 2022-01-16 NOTE — Therapy (Signed)
OUTPATIENT PHYSICAL THERAPY TMJ TREATMENT  Patient Name: Lacey Rodgers MRN: [id number] DOB:02/22/1980, 42 y.o., female Today's Date: 01/17/2022   PT End of Session - 01/17/22 1137     Visit Number 9    Number of Visits 17    Date for PT Re-Evaluation 01/24/22    Authorization Type eval: 10/04/21, recertification: 11/29/21;    PT Start Time 0855    PT Stop Time 0935    PT Time Calculation (min) 40 min    Activity Tolerance Patient tolerated treatment well    Behavior During Therapy [facility] for tasks assessed/performed            Past Medical History:  Diagnosis Date   Depression    Genital herpes    GERD (gastroesophageal reflux disease)    History of kidney stones    currently   Hypertension    Migraine    Morbid obesity with BMI of 45.0-49.9, adult [facility])    Past Surgical History:  Procedure Laterality Date   CESAREAN SECTION N/A 12/16/2015   Procedure: CESAREAN SECTION;  Surgeon: Conard Novak, MD;  Location: [facility] ORS;  Service: Obstetrics;  Laterality: N/A;   CESAREAN SECTION WITH BILATERAL TUBAL LIGATION  12/02/2018   Procedure: CESAREAN SECTION WITH BILATERAL TUBAL LIGATION;  Surgeon: Natale Milch, MD;  Location: [facility] ORS;  Service: Obstetrics;;  TOB 1850 WEIGHT 7lb 5oz LENGTH 19.75 APGAR 9/9   DILATION AND CURETTAGE OF UTERUS  01/2013   INSERTION OF MESH  07/18/2021   Procedure: INSERTION OF MESH;  Surgeon: Earline Mayotte, MD;  Location: [facility] ORS;  Service: General;;   VENTRAL HERNIA REPAIR N/A 07/18/2021   Procedure: HERNIA REPAIR VENTRAL ADULT;  Surgeon: Earline Mayotte, MD;  Location: [facility] ORS;  Service: General;  Laterality: N/A;  Sonda Rumble, RNFA to assist   Patient Active Problem List   Diagnosis Date Noted   HSV-2 (herpes simplex virus 2) infection 02/28/2021   Numbness and tingling in both hands 06/24/2020   BMI 45.0-49.9, adult (HCC) 05/22/2018   Moderate recurrent major depression (HCC) 02/08/2017   Migraine aura, persistent  10/10/2012   GENITAL HERPES 10/23/2008   COMMON MIGRAINE 10/23/2008   Essential hypertension 10/23/2008   GERD 10/23/2008   PCP: [name], MD  REFERRING PROVIDER: [name], PA  REFERRING DIAGNOSIS: M26.609 (ICD-10-CM) - TMJ dysfunction  THERAPY DIAG: Cervicalgia  RATIONALE FOR EVALUATION AND TREATMENT: Rehabilitation  ONSET DATE: Unknown, ongoing for multiple years  FOLLOW UP APPT WITH PROVIDER: No, not reported  FROM INITIAL EVALUATION (10/04/21) SUBJECTIVE:  Chief Complaint: [name]  Pertinent History Pt reports [name] pain for multiple years which has worsened notably over the last year. No known jaw trauma at onset. Along with the jaw pain she also complains of L ear pain and fullness. She describes the pain as constant and achy. Pt confirms grinding her teeth at night with significant clenching during the day. She notices inflammation occasionally around the [name] when it is really aggravated. She wears a night guard when sleeping and is considering getting an updated one but is concerned that it might be a large out of pocket expense. She gets regular dental examinations and had her last exam approximately 6 months ago. No recent dental procedures or orthodontics. She does not chew gum. Pt reports significant pain with end range depression, eating chewy food, and talking a lot during the day at work.  Last Dental Examination and imaging: Last exam 6 months ago, unsure of last imaging  Recent dental procedures or orthodontics: No  Pain location: [name], masseter, and ear canal Pain Severity: Present: 0-1/10, Best: 0/10, Worst: 5/10 Pain quality: constant and aching  Radiating pain: No Numbness/Tingling: No Aggravating factors: excessive talking, eating chewy foot,  opening mouth wide Easing factors: massage, rest, ibuprofen  Clicking, catching, or crepitus during chewing: Yes 24 hour pain behavior: fluctuates History of grinding teeth: Yes at night as well as frequent clenching during the day. Recent or remote jaw/face/neck trauma, injury, or pain: No Falls: Has patient fallen in last 6 months? No; History of prior physical therapy for this issue: No  Imaging: No, pt is unsure of last dental imaging but she has never had specific imaging for this issue Progression (improving, worsening, unchanged): worsening over the last year History of headaches/migraines: Yes, history of headaches and migraines Ear symptoms (tinnitus, fullness, pain): Yes, L tinnitus (sometimes right), L ear fullness, L ear pain Chest pain: No Stress/anxiety: Yes, pt endorses fatigue and stress related to serving as caregiver to her two young children. Dominant hand: right Occupational demands: Endoscopy technician Hobbies: spending time with family, camping Red flags: Positive: night sweats, Negative: personal history of cancer, chills/fever, nausea, vomiting, unrelenting pain, unexplained weight gain/loss  Precautions: None  Weight Bearing Restrictions: No  Patient Goals: Pt wants to be able to open her mouth as wide as necessary without pain;  OBJECTIVE  Mental Status Patient is oriented to person, place and time.  Recent memory is intact.  Remote memory is intact.  Attention span and concentration are intact.  Expressive speech is intact.  Patient's fund of knowledge is within normal limits for educational level.  Cranial Nerves Visual acuity and visual fields are intact  Extraocular muscles are intact  Facial sensation is intact bilaterally  Facial strength is intact bilaterally  Hearing is normal as tested by gross conversation Palate elevates midline, normal phonation  Shoulder shrug strength is intact  Tongue protrudes midline   MUSCULOSKELETAL: Tremor:  None Bulk: Normal Tone: Normal Facial Symmetry: Face appears grossly symmetrical but possibly with some deviation of the jaw to the right  Posture Forward head and rounded shoulders in sitting;  Cervical Screen Centralization: Deferred Isometrics: Full and painless in all directions Passive Accessory Intervertebral Motion (PAIVM), central and unilateral (bilaterally): Normal mobility without reproduction of jaw symptoms however pt does report some cervical tenderness. Spurlings A (ipsilateral lateral flexion/axial compression): R: Negative L: Negative Spurlings B (ipsilateral lateral flexion/contralateral rotation/axial compression): R: Negative L: Negative Cervical Distraction Test: Not examined  Cervical Fexion-Rotation Test: Not examined  Hoffman Sign (cervical cord compression): R: Not examined L: Not examined  AROM AROM (Normal range in degrees) AROM  Cervical  Flexion (50) 50  Extension (80) 38  Right lateral flexion (45) 40*  Left lateral flexion (45) 40*  Right rotation (85)   Left rotation (85)   (* = pain; Blank rows = not tested)  Dermatome/Myotome Screen N=normal  Ab=abnormal Level Dermatome R L Myotome R L Reflex R L  C2 Posterior Scalp N N Cervical Flexion/Extension C1-2 N N Jaw CN V    C3 Anterior Neck N N Cervical Sidebend C2-3 N N Biceps C5-6    C4 Top of Shoulder N N Shoulder Shrug C4 N N Brachiorad. C5-6    C5 Lateral Upper Arm N N Shoulder ABD C4-5 N N Triceps C7    C6 Lateral Arm/ Thumb N N Arm Flex/ Wrist Ext C5-6 N N     C7 Middle Finger N N Arm Ext//Wrist Flex C6-7 N N     C8 4th & 5th Finger N N Flex/ Ext Carpi Ulnaris C8 N N     T1 Medial Arm N N Interossei T1 N N      Sensation Grossly intact to light touch bilateral face and UE as determined by testing branches of trigeminal nerve and dermatomes C2-T2  Reflexes Not tested  Palpation Graded on 0-4 scale (0 = no pain, 1 = pain, 2 = pain with wincing/grimacing/flinching, 3 = pain with  withdrawal, 4 = unwilling to allow palpation) Location LEFT  RIGHT           Temporomandibular Joint (posterior, superior, anterior) 1 0  Temporalis (anterior, middle, posterior fibers) 1 0  Temporalis Tendon Insertion 1 0  Masseter (Zygoma, Body, Lateral surface of angle of mandible) 1 0  Medial ptyergoid 1 1  Frontal Sinus 0 0  Maxillary Sinus 0 0  SCM 1 0  Upper Trapezius 0 0  Subocciptials 0 0   Mandibular AROM Resting Dental Alignment/Occlusion (Overbite, underbite, overjet, crossbite): Lower incisors rest 53m to the right of upper incisors  Mandible Depression (40-577m: 41 Mandible Protrusion (3-64m43m 7 Mandible Lateral Excursion (10-70m25mR: 9  L: 13 Audible joint sounds (crepitus or clicking with stethoscope with opening, lateral deviation, and bite): Negative Reciprocal Click (palpation, click with both opening/closing): Positive Deviation of Mouth During Opening: Positive for deviation to the left Deflection of Mouth at End Range: Positive for deviation to the left  Strength R/L Functional Mandible Depression (Jaw Opening): Strong and painless Functional Mandible Protrusion:  Strong and painful on the left Functional Mandible Elevation (Jaw Closing) Strong and painful on the left Functional Mandible Lateral Deviation: Strong and painful on the left with resistance moving bilaterally  Passive Accessory Joint Motion (PAIVM) Deferred  Special Tests Biting on one separator (ipsilateral muscle activation/contralateral joint compression): R: Negative L: Negative Biting on two separators (bilateral muscle activation): R: Negative L: Negative Manual Joint Compression: Positive [name] pain Manual Joint Distraction: Positive for [name] pain  Beighton scale:   LEFT  RIGHT           1. Passive dorsiflexion and hyperextension of the fifth MCP joint beyond 90  0 0   2. Passive apposition of the thumb to the flexor aspect of the forearm  0  0   3. Passive hyperextension of the  elbow beyond 10  0  0   4. Passive hyperextension of the knee beyond 10  5. Active forward flexion of the trunk with the knees fully extended so that the palms of the hands rest flat on the floor     TOTAL          MMT MMT (out of 5) Right 01/17/2022 Left 01/17/2022  Cervical (isometric)  Flexion WNL  Extension WNL  Lateral Flexion WNL WNL  Rotation WNL WNL      Shoulder   Flexion 5 5* (neck pain)  Extension    Abduction 5 5  Internal rotation    External rotation    Horizontal abduction    Horizontal adduction    Lower Trapezius    Rhomboids        Elbow  Flexion 5 5  Extension 5 5  Pronation    Supination        Wrist  Flexion 5 5  Extension 5 5  Radial deviation    Ulnar deviation        MCP  Flexion 5 5  Extension 5 5  Abduction    Adduction    (* = pain; Blank rows = not tested)   TODAY'S TREATMENT    SUBJECTIVE: Pt reports that she is doing alright today. Her TMJ pain has been doing well but she continues with notable neck pain. No specific questions currently.    PAIN: Mild [name] pain, notable neck pain.    Manual Therapy  STM to bilateral upper trap, levator scapulae, and suboccipitals using effleurage and short duration trigger point release; Supine upper trap and lateral flexion stretches x 45s each bilateral; Suboccipital release x 45s; Manual cervical traction 15s hold/15s relax x 3; Prone C2-T5, CPA mobilizations grade II-III, 30s/bout x 1 bout at each level; Prone C2-C5, UPA mobilizations grade II-III, 30s/bout x 1 bouts each per level bilaterally;   Ther-ex  Prone cervical retractions with manual resistance from therapist x 10; Supine cervical retractions with overpressure from therapist x 10; Supine manually resisted cervical rotation and lateral flexion x 10 each bilaterally; Education about possible dietary migraine triggers, list provided;   Trigger Point Dry Needling (TDN), unbilled Education previoulsy performed with  patient regarding potential benefit of TDN. Pt provided verbal consent to treatment. In prone using clean technique TDN performed to bilateral upper traps with 2, 0.25 x 40 single needle placements (1 on each side) with local twitch response (LTR) during all placements. Pistoning technique utilized.     Not performed: Supine isometric jaw deviation 6s hold x 6 toward each side; Tongue clicks with pt demonstrating proper technique; Supine manually resisted mandibular depression 3s hold x 10; [name] anterior, and R lateral mobilizations, grade I-II, 20s/bout x 2 bouts each direction;   PATIENT EDUCATION:  Education details: Pt educated throughout session about proper posture and technique with exercises. Improved exercise technique, movement at target joints, use of target muscles after min to mod verbal, visual, tactile cues.  Person educated: Patient  Education method: Explanation Education comprehension: verbalized understanding and returned demonstration   HOME EXERCISE PROGRAM: Access Code: [email or web address] URL: [email or web address] Date: 11/01/2021 Prepared by: Roxana Hires  Exercises - Tongue Clicks TMJ  - 6 x daily - 7 x weekly - 6 reps - Seated Scapular Retraction  - 6 x daily - 7 x weekly - 6 reps - 6s hold - Seated Cervical Retraction  - 6 x daily - 7 x weekly - 6 reps - 6s hold - Isometric Jaw Deviation  - 6 x daily - 7 x weekly - 6 reps -  6s hold - Isometric Jaw Deviation (Mirrored)  - 6 x daily - 7 x weekly - 6 reps - 6s hold  Patient Education - TMJ Parafunctions - Resting Jaw Position   ASSESSMENT:  CLINICAL IMPRESSION: Session today focused on neck pain mostly again today given that her TMJ pain is doing well. Repeated trigger point dry needling but decreased to 1 placement in each upper trap secondary to prolonged soreness after last therapy session. No HEP modifications on this date but pt encouraged to continue her current program. Education about  possible dietary migraine triggers and provided a list to patient. Advised pt to follow-up as scheduled. If symptoms persistent pt plans to discuss a specialty nighttime TMJ oral appliance with her dentist. She will need a progress note at her next visit. Pt will benefit from PT services to address deficits in pain and tissue restriction in order to return to full function at home and work with less pain.   REHAB POTENTIAL: Excellent  CLINICAL DECISION MAKING: Evolving/moderate complexity  EVALUATION COMPLEXITY: Moderate   GOALS: Goals reviewed with patient? Yes  SHORT TERM GOALS:   Pt will be independent with HEP to improve strength and decrease TMJ pain to improve pain-free function at home and work. Baseline:  Goal status: ACHIEVED   LONG TERM GOALS: Target date: 01/24/2022  Pt will increase FOTO to at least 48 in order to demonstrate significant improvement in function at home and work related to TMJ pain Baseline: 10/04/21: To be completed; 10/18/21: 38; 11/29/21: 45 Goal status: PARTIALLY MET  2.  Pt will decrease worst TMJ pain by at least 3 points on the NPRS in order to demonstrate clinically significant reduction in neck pain. Baseline: 10/04/21: worst: 5/10; 11/08/21: 5/10; Goal status: ONGOING  3.  Pt will decrease NDI score by at least 19% in order demonstrate clinically significant reduction in neck pain/disability.       Baseline: 10/04/21: To be completed; 11/29/21: 26% Goal status: ONGOING  4.  Pt will report at least 75% improvement in her pain when opening her mouth to eat and while talking at work in order to return to full function without significant increase in pain. Baseline: 11/08/21: Unchanged; 11/29/21: 30% improvement Goal status: PARTIALLY MET   PLAN: PT FREQUENCY: 1-2x/week  PT DURATION: 8 weeks  PLANNED INTERVENTIONS: Therapeutic exercises, Therapeutic activity, Neuromuscular re-education, Balance training, Gait training, Patient/Family education,  Joint manipulation, Joint mobilization, Vestibular training, Canalith repositioning, Dry Needling, Electrical stimulation, Spinal manipulation, Spinal mobilization, Cryotherapy, Moist heat, Taping, Traction, Ultrasound, Ionotophoresis 4mg /ml Dexamethasone, and Manual therapy  PLAN FOR NEXT SESSION: progress note, measure cervical rotation, review and modify HEP as needed, progress strengthening;  Lyndel Safe Caetano Oberhaus PT, DPT, GCS  Jaray Boliver 01/17/2022, 11:48 AM

## 2022-01-17 ENCOUNTER — Ambulatory Visit: Payer: 59

## 2022-01-17 DIAGNOSIS — M542 Cervicalgia: Secondary | ICD-10-CM | POA: Diagnosis not present

## 2022-01-17 DIAGNOSIS — M545 Low back pain, unspecified: Secondary | ICD-10-CM | POA: Diagnosis not present

## 2022-01-17 DIAGNOSIS — R1310 Dysphagia, unspecified: Secondary | ICD-10-CM | POA: Diagnosis not present

## 2022-01-17 DIAGNOSIS — G8929 Other chronic pain: Secondary | ICD-10-CM | POA: Diagnosis not present

## 2022-01-17 DIAGNOSIS — M546 Pain in thoracic spine: Secondary | ICD-10-CM | POA: Diagnosis not present

## 2022-01-17 DIAGNOSIS — K219 Gastro-esophageal reflux disease without esophagitis: Secondary | ICD-10-CM | POA: Diagnosis not present

## 2022-01-20 NOTE — Therapy (Addendum)
OUTPATIENT PHYSICAL THERAPY TMJ TREATMENT/PROGRESS NOTE  Dates of reporting period  10/04/21   to   01/24/22   Patient Name: Lacey Rodgers MRN: [id number] DOB:Dec 12, 1980, 42 y.o., female Today's Date: 01/24/2022   PT End of Session - 01/24/22 0918     Visit Number 10    Number of Visits 33    Date for PT Re-Evaluation 03/21/22    Authorization Type eval: 87/8/67, recertification: 67/20/94, 01/24/22    PT Start Time 0850    PT Stop Time 0930    PT Time Calculation (min) 40 min    Activity Tolerance Patient tolerated treatment well    Behavior During Therapy [facility] for tasks assessed/performed            Past Medical History:  Diagnosis Date   Depression    Genital herpes    GERD (gastroesophageal reflux disease)    History of kidney stones    currently   Hypertension    Migraine    Morbid obesity with BMI of 45.0-49.9, adult [facility])    Past Surgical History:  Procedure Laterality Date   CESAREAN SECTION N/A 12/16/2015   Procedure: CESAREAN SECTION;  Surgeon: Will Bonnet, MD;  Location: [facility] ORS;  Service: Obstetrics;  Laterality: N/A;   CESAREAN SECTION WITH BILATERAL TUBAL LIGATION  12/02/2018   Procedure: CESAREAN SECTION WITH BILATERAL TUBAL LIGATION;  Surgeon: Homero Fellers, MD;  Location: [facility] ORS;  Service: Obstetrics;;  TOB 1850 WEIGHT 7lb 5oz LENGTH 19.75 APGAR 9/9   DILATION AND CURETTAGE OF UTERUS  01/2013   INSERTION OF MESH  07/18/2021   Procedure: INSERTION OF MESH;  Surgeon: Robert Bellow, MD;  Location: [facility] ORS;  Service: General;;   VENTRAL HERNIA REPAIR N/A 07/18/2021   Procedure: HERNIA REPAIR VENTRAL ADULT;  Surgeon: Robert Bellow, MD;  Location: [facility] ORS;  Service: General;  Laterality: N/A;  Floyce Stakes, RNFA to assist   Patient Active Problem List   Diagnosis Date Noted   HSV-2 (herpes simplex virus 2) infection 02/28/2021   Numbness and tingling in both hands 06/24/2020   BMI 45.0-49.9, adult ([place]) 05/22/2018    Moderate recurrent major depression ([place]) 02/08/2017   Migraine aura, persistent 10/10/2012   GENITAL HERPES 10/23/2008   COMMON MIGRAINE 10/23/2008   Essential hypertension 10/23/2008   GERD 10/23/2008   PCP: Owens Loffler, MD  REFERRING PROVIDER: Willaim Rayas, [place]  REFERRING DIAGNOSIS: M26.609 (ICD-10-CM) - TMJ dysfunction  THERAPY DIAG: Cervicalgia  Pain in thoracic spine  RATIONALE FOR EVALUATION AND TREATMENT: Rehabilitation  ONSET DATE: Unknown, ongoing for multiple years  FOLLOW UP APPT WITH PROVIDER: No, not reported  FROM INITIAL EVALUATION (10/04/21) SUBJECTIVE:  Chief Complaint: [name]  Pertinent History Pt reports [name] pain for multiple years which has worsened notably over the last year. No known jaw trauma at onset. Along with the jaw pain she also complains of L ear pain and fullness. She describes the pain as constant and achy. Pt confirms grinding her teeth at night with significant clenching during the day. She notices inflammation occasionally around the [name] when it is really aggravated. She wears a night guard when sleeping and is considering getting an updated one but is concerned that it might be a large out of pocket expense. She gets regular dental examinations and had her last exam approximately 6 months ago. No recent dental procedures or orthodontics. She does not chew gum. Pt reports significant pain with end range depression, eating chewy food, and talking a lot during the day at work.  Last Dental Examination and imaging: Last exam 6 months ago, unsure of last imaging  Recent dental procedures or orthodontics: No  Pain location: [name], masseter, and ear canal Pain Severity: Present: 0-1/10, Best: 0/10, Worst: 5/10 Pain quality: constant and aching   Radiating pain: No Numbness/Tingling: No Aggravating factors: excessive talking, eating chewy foot, opening mouth wide Easing factors: massage, rest, ibuprofen  Clicking, catching, or crepitus during chewing: Yes 24 hour pain behavior: fluctuates History of grinding teeth: Yes at night as well as frequent clenching during the day. Recent or remote jaw/face/neck trauma, injury, or pain: No Falls: Has patient fallen in last 6 months? No; History of prior physical therapy for this issue: No  Imaging: No, pt is unsure of last dental imaging but she has never had specific imaging for this issue Progression (improving, worsening, unchanged): worsening over the last year History of headaches/migraines: Yes, history of headaches and migraines Ear symptoms (tinnitus, fullness, pain): Yes, L tinnitus (sometimes right), L ear fullness, L ear pain Chest pain: No Stress/anxiety: Yes, pt endorses fatigue and stress related to serving as caregiver to her two young children. Dominant hand: right Occupational demands: Endoscopy technician Hobbies: spending time with family, camping Red flags: Positive: night sweats, Negative: personal history of cancer, chills/fever, nausea, vomiting, unrelenting pain, unexplained weight gain/loss  Precautions: None  Weight Bearing Restrictions: No  Patient Goals: Pt wants to be able to open her mouth as wide as necessary without pain;  OBJECTIVE  Mental Status Patient is oriented to person, place and time.  Recent memory is intact.  Remote memory is intact.  Attention span and concentration are intact.  Expressive speech is intact.  Patient's fund of knowledge is within normal limits for educational level.  Cranial Nerves Visual acuity and visual fields are intact  Extraocular muscles are intact  Facial sensation is intact bilaterally  Facial strength is intact bilaterally  Hearing is normal as tested by gross conversation Palate elevates midline, normal  phonation  Shoulder shrug strength is intact  Tongue protrudes midline   MUSCULOSKELETAL: Tremor: None Bulk: Normal Tone: Normal Facial Symmetry: Face appears grossly symmetrical but possibly with some deviation of the jaw to the right  Posture Forward head and rounded shoulders in sitting;  Cervical Screen Centralization: Deferred Isometrics: Full and painless in all directions Passive Accessory Intervertebral Motion (PAIVM), central and unilateral (bilaterally): Normal mobility without reproduction of jaw symptoms however pt does report some cervical tenderness. Spurlings A (ipsilateral lateral flexion/axial compression): R: Negative L: Negative Spurlings B (ipsilateral lateral flexion/contralateral rotation/axial compression): R: Negative L: Negative Cervical Distraction Test: Not examined  Cervical Fexion-Rotation Test: Not examined  Hoffman Sign (cervical cord compression): R: Not examined L: Not examined  AROM AROM (Normal range in degrees) AROM  Cervical  Flexion (50) 50  Extension (80) 38  Right lateral flexion (45) 40*  Left lateral flexion (45) 40*  Right rotation (85)   Left rotation (85)   (* = pain; Blank rows = not tested)  Dermatome/Myotome Screen N=normal  Ab=abnormal Level Dermatome R L Myotome R L Reflex R L  C2 Posterior Scalp N N Cervical Flexion/Extension C1-2 N N Jaw CN V    C3 Anterior Neck N N Cervical Sidebend C2-3 N N Biceps C5-6    C4 Top of Shoulder N N Shoulder Shrug C4 N N Brachiorad. C5-6    C5 Lateral Upper Arm N N Shoulder ABD C4-5 N N Triceps C7    C6 Lateral Arm/ Thumb N N Arm Flex/ Wrist Ext C5-6 N N     C7 Middle Finger N N Arm Ext//Wrist Flex C6-7 N N     C8 4th & 5th Finger N N Flex/ Ext Carpi Ulnaris C8 N N     T1 Medial Arm N N Interossei T1 N N      Sensation Grossly intact to light touch bilateral face and UE as determined by testing branches of trigeminal nerve and dermatomes C2-T2  Reflexes Not tested  Palpation Graded  on 0-4 scale (0 = no pain, 1 = pain, 2 = pain with wincing/grimacing/flinching, 3 = pain with withdrawal, 4 = unwilling to allow palpation) Location LEFT  RIGHT           Temporomandibular Joint (posterior, superior, anterior) 1 0  Temporalis (anterior, middle, posterior fibers) 1 0  Temporalis Tendon Insertion 1 0  Masseter (Zygoma, Body, Lateral surface of angle of mandible) 1 0  Medial ptyergoid 1 1  Frontal Sinus 0 0  Maxillary Sinus 0 0  SCM 1 0  Upper Trapezius 0 0  Subocciptials 0 0   Mandibular AROM Resting Dental Alignment/Occlusion (Overbite, underbite, overjet, crossbite): Lower incisors rest 27mm to the right of upper incisors  Mandible Depression (40-1mm): 41 Mandible Protrusion (3-70mm): 7 Mandible Lateral Excursion (10-64mm): R: 9  L: 13 Audible joint sounds (crepitus or clicking with stethoscope with opening, lateral deviation, and bite): Negative Reciprocal Click (palpation, click with both opening/closing): Positive Deviation of Mouth During Opening: Positive for deviation to the left Deflection of Mouth at End Range: Positive for deviation to the left  Strength R/L Functional Mandible Depression (Jaw Opening): Strong and painless Functional Mandible Protrusion:  Strong and painful on the left Functional Mandible Elevation (Jaw Closing) Strong and painful on the left Functional Mandible Lateral Deviation: Strong and painful on the left with resistance moving bilaterally  Passive Accessory Joint Motion (PAIVM) Deferred  Special Tests Biting on one separator (ipsilateral muscle activation/contralateral joint compression): R: Negative L: Negative Biting on two separators (bilateral muscle activation): R: Negative L: Negative Manual Joint Compression: Positive [name] pain Manual Joint Distraction: Positive for [name] pain  Beighton scale:   LEFT  RIGHT           1. Passive dorsiflexion and hyperextension of the fifth MCP joint beyond 90  0 0   2. Passive  apposition of the thumb to the flexor aspect of the forearm  0  0   3. Passive hyperextension of the elbow beyond 10  0  0   4. Passive hyperextension of the knee beyond 10  5. Active forward flexion of the trunk with the knees fully extended so that the palms of the hands rest flat on the floor     TOTAL          MMT MMT (out of 5) Right 01/24/2022 Left 01/24/2022  Cervical (isometric)  Flexion WNL  Extension WNL  Lateral Flexion WNL WNL  Rotation WNL WNL      Shoulder   Flexion 5 5* (neck pain)  Extension    Abduction 5 5  Internal rotation    External rotation    Horizontal abduction    Horizontal adduction    Lower Trapezius    Rhomboids        Elbow  Flexion 5 5  Extension 5 5  Pronation    Supination        Wrist  Flexion 5 5  Extension 5 5  Radial deviation    Ulnar deviation        MCP  Flexion 5 5  Extension 5 5  Abduction    Adduction    (* = pain; Blank rows = not tested)   TODAY'S TREATMENT    SUBJECTIVE: Pt reports that she is doing alright today. Mild soreness after the last therapy session but not excessive. Her TMJ pain has been mild but her neck pain has been more significant. Overall she reports approximately 60% improvement in TMJ pain since starting with therapy.   PAIN: Mild [name] pain, notable neck pain.    Manual Therapy  Goals updated with patient (see goal section); Prone C2-T5, CPA mobilizations grade II-III, 30s/bout x 1 bout at each level; Prone C2-C5, UPA mobilizations grade II-III, 30s/bout x 1 bouts each per level bilaterally; Supine upper trap and lateral flexion stretches x 45s each bilateral; Suboccipital release x 45s; Manual cervical traction 15s hold/15s relax x 3;   Ther-ex  Supine cervical retractions with overpressure from therapist x 10; Supine manually resisted cervical rotation and lateral flexion x 5 each bilaterally; Nautilus seated lat pull down 70# x 10, 80# x 10; Nautilus seated rows 40# 2 x 10;     Trigger Point Dry Needling (TDN), unbilled Education previously performed with patient regarding potential benefit of TDN. Previously reviewed precautions and risks with patient. Pt provided verbal consent to treatment. In prone using clean technique TDN performed to bilateral upper traps with 2, 0.25 x 40 single needle placements (1 on each side) with local twitch response (LTR) during both placements. Pistoning technique utilized.     Not performed: Supine isometric jaw deviation 6s hold x 6 toward each side; Tongue clicks with pt demonstrating proper technique; Supine manually resisted mandibular depression 3s hold x 10; [name] anterior, and R lateral mobilizations, grade I-II, 20s/bout x 2 bouts each direction;   PATIENT EDUCATION:  Education details: Pt educated throughout session about proper posture and technique with exercises. Improved exercise technique, movement at target joints, use of target muscles after min to mod verbal, visual, tactile cues.  Person educated: Patient  Education method: Explanation Education comprehension: verbalized understanding and returned demonstration   HOME EXERCISE PROGRAM: Access Code: [email or web address] URL: [email or web address] Date: 11/01/2021 Prepared by: Roxana Hires  Exercises - Tongue Clicks TMJ  - 6 x daily - 7 x weekly - 6 reps - Seated Scapular Retraction  - 6 x daily - 7 x weekly - 6 reps - 6s hold - Seated Cervical Retraction  - 6 x daily - 7 x weekly - 6 reps - 6s hold -  Isometric Jaw Deviation  - 6 x daily - 7 x weekly - 6 reps - 6s hold - Isometric Jaw Deviation (Mirrored)  - 6 x daily - 7 x weekly - 6 reps - 6s hold  Patient Education - TMJ Parafunctions - Resting Jaw Position   ASSESSMENT:  CLINICAL IMPRESSION: Session today focused on neck pain mostly again today given that her TMJ pain is doing well. She is still having more neck and upper thoracic pain compared to TMJ pain currently. Updated outcome  measures with patient and she reports approximately 60% improvement in TMJ pain since starting with therapy. Her FOTO has improved from 38 to 41 but her NDI remains relatively unchanged since it was last updated. Repeated trigger point dry needling and manual therapy during session today. Progressed strengthening included resisted exercises with the Nautilus machine. Advised pt to follow-up as scheduled. If symptoms persistent pt plans to discuss a specialty nighttime TMJ oral appliance with her dentist. Pt will benefit from PT services to address deficits in pain and tissue restriction in order to return to full function at home and work with less pain.   REHAB POTENTIAL: Excellent  CLINICAL DECISION MAKING: Evolving/moderate complexity  EVALUATION COMPLEXITY: Moderate   GOALS: Goals reviewed with patient? Yes  SHORT TERM GOALS:   Pt will be independent with HEP to improve strength and decrease TMJ pain to improve pain-free function at home and work. Baseline:  Goal status: ACHIEVED   LONG TERM GOALS: Target date: 01/24/2022  Pt will increase FOTO to at least 48 in order to demonstrate significant improvement in function at home and work related to TMJ pain Baseline: 10/04/21: To be completed; 10/18/21: 38; 11/29/21: 45; 01/24/22: 41 Goal status: PARTIALLY MET  2.  Pt will decrease worst TMJ pain by at least 3 points on the NPRS in order to demonstrate clinically significant reduction in neck pain. Baseline: 10/04/21: worst: 5/10; 11/08/21: 5/10; 01/24/22: 4/10; Goal status: PARTIALLY MET  3.  Pt will decrease NDI score by at least 19% in order demonstrate clinically significant reduction in neck pain/disability.       Baseline: 10/04/21: To be completed; 11/29/21: 26%; 01/24/22: 28 Goal status: ONGOING  4.  Pt will report at least 75% improvement in her pain when opening her mouth to eat and while talking at work in order to return to full function without significant increase in  pain. Baseline: 11/08/21: Unchanged; 11/29/21: 30% improvement; 01/24/22: 60% improvement Goal status: PARTIALLY MET   PLAN: PT FREQUENCY: 1-2x/week  PT DURATION: 8 weeks  PLANNED INTERVENTIONS: Therapeutic exercises, Therapeutic activity, Neuromuscular re-education, Balance training, Gait training, Patient/Family education, Joint manipulation, Joint mobilization, Vestibular training, Canalith repositioning, Dry Needling, Electrical stimulation, Spinal manipulation, Spinal mobilization, Cryotherapy, Moist heat, Taping, Traction, Ultrasound, Ionotophoresis 4mg /ml Dexamethasone, and Manual therapy  PLAN FOR NEXT SESSION: review and modify HEP as needed, progress strengthening;  Lyndel Safe Lulubelle Simcoe PT, DPT, GCS  Jonathon Tan 01/24/2022, 1:28 PM

## 2022-01-24 ENCOUNTER — Ambulatory Visit: Payer: 59

## 2022-01-24 DIAGNOSIS — G8929 Other chronic pain: Secondary | ICD-10-CM | POA: Diagnosis not present

## 2022-01-24 DIAGNOSIS — M546 Pain in thoracic spine: Secondary | ICD-10-CM

## 2022-01-24 DIAGNOSIS — M542 Cervicalgia: Secondary | ICD-10-CM | POA: Diagnosis not present

## 2022-01-24 DIAGNOSIS — M545 Low back pain, unspecified: Secondary | ICD-10-CM | POA: Diagnosis not present

## 2022-01-30 NOTE — Therapy (Incomplete)
OUTPATIENT PHYSICAL THERAPY TMJ TREATMENT  Patient Name: Lacey Rodgers MRN: [id number] DOB:06/18/1980, 42 y.o., female Today's Date: 01/30/2022    Past Medical History:  Diagnosis Date   Depression    Genital herpes    GERD (gastroesophageal reflux disease)    History of kidney stones    currently   Hypertension    Migraine    Morbid obesity with BMI of 45.0-49.9, adult [facility])    Past Surgical History:  Procedure Laterality Date   CESAREAN SECTION N/A 12/16/2015   Procedure: CESAREAN SECTION;  Surgeon: Will Bonnet, MD;  Location: [facility];  Service: Obstetrics;  Laterality: N/A;   CESAREAN SECTION WITH BILATERAL TUBAL LIGATION  12/02/2018   Procedure: CESAREAN SECTION WITH BILATERAL TUBAL LIGATION;  Surgeon: Homero Fellers, MD;  Location: [facility];  Service: Obstetrics;;  TOB 1850 WEIGHT 7lb 5oz LENGTH 19.75 APGAR 9/9   DILATION AND CURETTAGE OF UTERUS  01/2013   INSERTION OF MESH  07/18/2021   Procedure: INSERTION OF MESH;  Surgeon: Robert Bellow, MD;  Location: [facility];  Service: General;;   VENTRAL HERNIA REPAIR N/A 07/18/2021   Procedure: HERNIA REPAIR VENTRAL ADULT;  Surgeon: Robert Bellow, MD;  Location: [facility];  Service: General;  Laterality: N/A;  Floyce Stakes, RNFA to assist   Patient Active Problem List   Diagnosis Date Noted   HSV-2 (herpes simplex virus 2) infection 02/28/2021   Numbness and tingling in both hands 06/24/2020   BMI 45.0-49.9, adult ([place]) 05/22/2018   Moderate recurrent major depression ([place]) 02/08/2017   Migraine aura, persistent 10/10/2012   GENITAL HERPES 10/23/2008   COMMON MIGRAINE 10/23/2008   Essential hypertension 10/23/2008   GERD 10/23/2008   PCP: Owens Loffler, MD  REFERRING PROVIDER: Owens Loffler, MD  REFERRING DIAGNOSIS: M26.609 (ICD-10-CM) - TMJ dysfunction  THERAPY DIAG: Cervicalgia  RATIONALE FOR EVALUATION AND TREATMENT: Rehabilitation  ONSET DATE: Unknown, ongoing for multiple  years  FOLLOW UP APPT WITH PROVIDER: No, not reported  FROM INITIAL EVALUATION (10/04/21) SUBJECTIVE:                                                                                                                                                                                         Chief Complaint: [name]  Pertinent History Pt reports [name] pain for multiple years which has worsened notably over the last year. No known jaw trauma at onset. Along with the jaw pain she also complains of L ear pain and fullness. She describes the pain as constant and achy. Pt confirms grinding her teeth at night with significant clenching during the day. She notices  inflammation occasionally around the [name] when it is really aggravated. She wears a night guard when sleeping and is considering getting an updated one but is concerned that it might be a large out of pocket expense. She gets regular dental examinations and had her last exam approximately 6 months ago. No recent dental procedures or orthodontics. She does not chew gum. Pt reports significant pain with end range depression, eating chewy food, and talking a lot during the day at work.  Last Dental Examination and imaging: Last exam 6 months ago, unsure of last imaging  Recent dental procedures or orthodontics: No  Pain location: [name], masseter, and ear canal Pain Severity: Present: 0-1/10, Best: 0/10, Worst: 5/10 Pain quality: constant and aching  Radiating pain: No Numbness/Tingling: No Aggravating factors: excessive talking, eating chewy foot, opening mouth wide Easing factors: massage, rest, ibuprofen  Clicking, catching, or crepitus during chewing: Yes 24 hour pain behavior: fluctuates History of grinding teeth: Yes at night as well as frequent clenching during the day. Recent or remote jaw/face/neck trauma, injury, or pain: No Falls: Has patient fallen in last 6 months? No; History of prior physical therapy for this issue: No   Imaging: No, pt is unsure of last dental imaging but she has never had specific imaging for this issue Progression (improving, worsening, unchanged): worsening over the last year History of headaches/migraines: Yes, history of headaches and migraines Ear symptoms (tinnitus, fullness, pain): Yes, L tinnitus (sometimes right), L ear fullness, L ear pain Chest pain: No Stress/anxiety: Yes, pt endorses fatigue and stress related to serving as caregiver to her two young children. Dominant hand: right Occupational demands: Endoscopy technician Hobbies: spending time with family, camping Red flags: Positive: night sweats, Negative: personal history of cancer, chills/fever, nausea, vomiting, unrelenting pain, unexplained weight gain/loss  Precautions: None  Weight Bearing Restrictions: No  Patient Goals: Pt wants to be able to open her mouth as wide as necessary without pain;  OBJECTIVE  Mental Status Patient is oriented to person, place and time.  Recent memory is intact.  Remote memory is intact.  Attention span and concentration are intact.  Expressive speech is intact.  Patient's fund of knowledge is within normal limits for educational level.  Cranial Nerves Visual acuity and visual fields are intact  Extraocular muscles are intact  Facial sensation is intact bilaterally  Facial strength is intact bilaterally  Hearing is normal as tested by gross conversation Palate elevates midline, normal phonation  Shoulder shrug strength is intact  Tongue protrudes midline   MUSCULOSKELETAL: Tremor: None Bulk: Normal Tone: Normal Facial Symmetry: Face appears grossly symmetrical but possibly with some deviation of the jaw to the right  Posture Forward head and rounded shoulders in sitting;  Cervical Screen Centralization: Deferred Isometrics: Full and painless in all directions Passive Accessory Intervertebral Motion (PAIVM), central and unilateral (bilaterally): Normal mobility  without reproduction of jaw symptoms however pt does report some cervical tenderness. Spurlings A (ipsilateral lateral flexion/axial compression): R: Negative L: Negative Spurlings B (ipsilateral lateral flexion/contralateral rotation/axial compression): R: Negative L: Negative Cervical Distraction Test: Not examined  Cervical Fexion-Rotation Test: Not examined  Hoffman Sign (cervical cord compression): R: Not examined L: Not examined  AROM AROM (Normal range in degrees) AROM  Cervical  Flexion (50) 50  Extension (80) 38  Right lateral flexion (45) 40*  Left lateral flexion (45) 40*  Right rotation (85)   Left rotation (85)   (* = pain; Blank rows = not tested)  Dermatome/Myotome Screen  N=normal  Ab=abnormal Level Dermatome R L Myotome R L Reflex R L  C2 Posterior Scalp N N Cervical Flexion/Extension C1-2 N N Jaw CN V    C3 Anterior Neck N N Cervical Sidebend C2-3 N N Biceps C5-6    C4 Top of Shoulder N N Shoulder Shrug C4 N N Brachiorad. C5-6    C5 Lateral Upper Arm N N Shoulder ABD C4-5 N N Triceps C7    C6 Lateral Arm/ Thumb N N Arm Flex/ Wrist Ext C5-6 N N     C7 Middle Finger N N Arm Ext//Wrist Flex C6-7 N N     C8 4th & 5th Finger N N Flex/ Ext Carpi Ulnaris C8 N N     T1 Medial Arm N N Interossei T1 N N      Sensation Grossly intact to light touch bilateral face and UE as determined by testing branches of trigeminal nerve and dermatomes C2-T2  Reflexes Not tested  Palpation Graded on 0-4 scale (0 = no pain, 1 = pain, 2 = pain with wincing/grimacing/flinching, 3 = pain with withdrawal, 4 = unwilling to allow palpation) Location LEFT  RIGHT           Temporomandibular Joint (posterior, superior, anterior) 1 0  Temporalis (anterior, middle, posterior fibers) 1 0  Temporalis Tendon Insertion 1 0  Masseter (Zygoma, Body, Lateral surface of angle of mandible) 1 0  Medial ptyergoid 1 1  Frontal Sinus 0 0  Maxillary Sinus 0 0  SCM 1 0  Upper Trapezius 0 0  Subocciptials  0 0   Mandibular AROM Resting Dental Alignment/Occlusion (Overbite, underbite, overjet, crossbite): Lower incisors rest 19mm to the right of upper incisors  Mandible Depression (40-27mm): 41 Mandible Protrusion (3-72mm): 7 Mandible Lateral Excursion (10-33mm): R: 9  L: 13 Audible joint sounds (crepitus or clicking with stethoscope with opening, lateral deviation, and bite): Negative Reciprocal Click (palpation, click with both opening/closing): Positive Deviation of Mouth During Opening: Positive for deviation to the left Deflection of Mouth at End Range: Positive for deviation to the left  Strength R/L Functional Mandible Depression (Jaw Opening): Strong and painless Functional Mandible Protrusion:  Strong and painful on the left Functional Mandible Elevation (Jaw Closing) Strong and painful on the left Functional Mandible Lateral Deviation: Strong and painful on the left with resistance moving bilaterally  Passive Accessory Joint Motion (PAIVM) Deferred  Special Tests Biting on one separator (ipsilateral muscle activation/contralateral joint compression): R: Negative L: Negative Biting on two separators (bilateral muscle activation): R: Negative L: Negative Manual Joint Compression: Positive [name] pain Manual Joint Distraction: Positive for [name] pain  Beighton scale:   LEFT  RIGHT           1. Passive dorsiflexion and hyperextension of the fifth MCP joint beyond 90  0 0   2. Passive apposition of the thumb to the flexor aspect of the forearm  0  0   3. Passive hyperextension of the elbow beyond 10  0  0   4. Passive hyperextension of the knee beyond 10      5. Active forward flexion of the trunk with the knees fully extended so that the palms of the hands rest flat on the floor     TOTAL          MMT MMT (out of 5) Right 01/30/2022 Left 01/30/2022  Cervical (isometric)  Flexion WNL  Extension WNL  Lateral Flexion WNL WNL  Rotation WNL WNL  Shoulder   Flexion 5 5*  (neck pain)  Extension    Abduction 5 5  Internal rotation    External rotation    Horizontal abduction    Horizontal adduction    Lower Trapezius    Rhomboids        Elbow  Flexion 5 5  Extension 5 5  Pronation    Supination        Wrist  Flexion 5 5  Extension 5 5  Radial deviation    Ulnar deviation        MCP  Flexion 5 5  Extension 5 5  Abduction    Adduction    (* = pain; Blank rows = not tested)   TODAY'S TREATMENT    SUBJECTIVE: Pt reports that she is doing alright today. Mild soreness after the last therapy session but not excessive. Her TMJ pain has been mild but her neck pain has been more significant. Overall she reports approximately 60% improvement in TMJ pain since starting with therapy.   PAIN: Mild [name] pain, notable neck pain.    Manual Therapy  Goals updated with patient (see goal section); Prone C2-T5, CPA mobilizations grade II-III, 30s/bout x 1 bout at each level; Prone C2-C5, UPA mobilizations grade II-III, 30s/bout x 1 bouts each per level bilaterally; Supine upper trap and lateral flexion stretches x 45s each bilateral; Suboccipital release x 45s; Manual cervical traction 15s hold/15s relax x 3;   Ther-ex  Supine cervical retractions with overpressure from therapist x 10; Supine manually resisted cervical rotation and lateral flexion x 5 each bilaterally; Nautilus seated lat pull down 70# x 10, 80# x 10; Nautilus seated rows 40# 2 x 10;    Trigger Point Dry Needling (TDN), unbilled Education previously performed with patient regarding potential benefit of TDN. Previously reviewed precautions and risks with patient. Pt provided verbal consent to treatment. In prone using clean technique TDN performed to bilateral upper traps with 2, 0.25 x 40 single needle placements (1 on each side) with local twitch response (LTR) during both placements. Pistoning technique utilized.     Not performed: Supine isometric jaw deviation 6s hold x 6  toward each side; Tongue clicks with pt demonstrating proper technique; Supine manually resisted mandibular depression 3s hold x 10; [name] anterior, and R lateral mobilizations, grade I-II, 20s/bout x 2 bouts each direction;   PATIENT EDUCATION:  Education details: Pt educated throughout session about proper posture and technique with exercises. Improved exercise technique, movement at target joints, use of target muscles after min to mod verbal, visual, tactile cues.  Person educated: Patient  Education method: Explanation Education comprehension: verbalized understanding and returned demonstration   HOME EXERCISE PROGRAM: Access Code: [email or web address] URL: [email or web address] Date: 11/01/2021 Prepared by: Roxana Hires  Exercises - Tongue Clicks TMJ  - 6 x daily - 7 x weekly - 6 reps - Seated Scapular Retraction  - 6 x daily - 7 x weekly - 6 reps - 6s hold - Seated Cervical Retraction  - 6 x daily - 7 x weekly - 6 reps - 6s hold - Isometric Jaw Deviation  - 6 x daily - 7 x weekly - 6 reps - 6s hold - Isometric Jaw Deviation (Mirrored)  - 6 x daily - 7 x weekly - 6 reps - 6s hold  Patient Education - TMJ Parafunctions - Resting Jaw Position   ASSESSMENT:  CLINICAL IMPRESSION: Session today focused on neck pain mostly again today given that her TMJ pain  is doing well. She is still having more neck and upper thoracic pain compared to TMJ pain currently. Updated outcome measures with patient and she reports approximately 60% improvement in TMJ pain since starting with therapy. Her FOTO has improved from 38 to 41 but her NDI remains relatively unchanged since it was last updated. Repeated trigger point dry needling and manual therapy during session today. Progressed strengthening included resisted exercises with the Nautilus machine. Advised pt to follow-up as scheduled. If symptoms persistent pt plans to discuss a specialty nighttime TMJ oral appliance with her dentist. Pt  will benefit from PT services to address deficits in pain and tissue restriction in order to return to full function at home and work with less pain.   REHAB POTENTIAL: Excellent  CLINICAL DECISION MAKING: Evolving/moderate complexity  EVALUATION COMPLEXITY: Moderate   GOALS: Goals reviewed with patient? Yes  SHORT TERM GOALS:   Pt will be independent with HEP to improve strength and decrease TMJ pain to improve pain-free function at home and work. Baseline:  Goal status: ACHIEVED   LONG TERM GOALS: Target date: 01/24/2022  Pt will increase FOTO to at least 48 in order to demonstrate significant improvement in function at home and work related to TMJ pain Baseline: 10/04/21: To be completed; 10/18/21: 38; 11/29/21: 45; 01/24/22: 41 Goal status: PARTIALLY MET  2.  Pt will decrease worst TMJ pain by at least 3 points on the NPRS in order to demonstrate clinically significant reduction in neck pain. Baseline: 10/04/21: worst: 5/10; 11/08/21: 5/10; 01/24/22: 4/10; Goal status: PARTIALLY MET  3.  Pt will decrease NDI score by at least 19% in order demonstrate clinically significant reduction in neck pain/disability.       Baseline: 10/04/21: To be completed; 11/29/21: 26%; 01/24/22: 28 Goal status: ONGOING  4.  Pt will report at least 75% improvement in her pain when opening her mouth to eat and while talking at work in order to return to full function without significant increase in pain. Baseline: 11/08/21: Unchanged; 11/29/21: 30% improvement; 01/24/22: 60% improvement Goal status: PARTIALLY MET   PLAN: PT FREQUENCY: 1-2x/week  PT DURATION: 8 weeks  PLANNED INTERVENTIONS: Therapeutic exercises, Therapeutic activity, Neuromuscular re-education, Balance training, Gait training, Patient/Family education, Joint manipulation, Joint mobilization, Vestibular training, Canalith repositioning, Dry Needling, Electrical stimulation, Spinal manipulation, Spinal mobilization, Cryotherapy, Moist  heat, Taping, Traction, Ultrasound, Ionotophoresis 4mg /ml Dexamethasone, and Manual therapy  PLAN FOR NEXT SESSION: review and modify HEP as needed, progress strengthening;  Chelsey Kimberley PT, DPT, GCS  Brighten Orndoff 01/30/2022, 8:20 AM

## 2022-01-31 ENCOUNTER — Ambulatory Visit: Payer: 59

## 2022-01-31 DIAGNOSIS — M542 Cervicalgia: Secondary | ICD-10-CM

## 2022-02-09 NOTE — Therapy (Incomplete)
OUTPATIENT PHYSICAL THERAPY TMJ TREATMENT  Patient Name: VALARIE FARACE MRN: [id number] DOB:[birth year], 42 y.o., female Today's Date: 02/09/2022    Past Medical History:  Diagnosis Date   Depression    Genital herpes    GERD (gastroesophageal reflux disease)    History of kidney stones    currently   Hypertension    Migraine    Morbid obesity with BMI of 45.0-49.9, adult [facility])    Past Surgical History:  Procedure Laterality Date   CESAREAN SECTION N/A 12/16/2015   Procedure: CESAREAN SECTION;  Surgeon: Will Bonnet, MD;  Location: [facility] ORS;  Service: Obstetrics;  Laterality: N/A;   CESAREAN SECTION WITH BILATERAL TUBAL LIGATION  12/02/2018   Procedure: CESAREAN SECTION WITH BILATERAL TUBAL LIGATION;  Surgeon: Homero Fellers, MD;  Location: [facility] ORS;  Service: Obstetrics;;  TOB 1850 WEIGHT 7lb 5oz LENGTH 19.75 APGAR 9/9   DILATION AND CURETTAGE OF UTERUS  01/2013   INSERTION OF MESH  07/18/2021   Procedure: INSERTION OF MESH;  Surgeon: Robert Bellow, MD;  Location: [facility] ORS;  Service: General;;   VENTRAL HERNIA REPAIR N/A 07/18/2021   Procedure: HERNIA REPAIR VENTRAL ADULT;  Surgeon: Robert Bellow, MD;  Location: [facility] ORS;  Service: General;  Laterality: N/A;  Floyce Stakes, RNFA to assist   Patient Active Problem List   Diagnosis Date Noted   HSV-2 (herpes simplex virus 2) infection 02/28/2021   Numbness and tingling in both hands 06/24/2020   BMI 45.0-49.9, adult ([place]) 05/22/2018   Moderate recurrent major depression ([place]) 02/08/2017   Migraine aura, persistent 10/10/2012   GENITAL HERPES 10/23/2008   COMMON MIGRAINE 10/23/2008   Essential hypertension 10/23/2008   GERD 10/23/2008   PCP: Owens Loffler, MD  REFERRING PROVIDER: Owens Loffler, MD  REFERRING DIAGNOSIS: M26.609 (ICD-10-CM) - TMJ dysfunction  THERAPY DIAG: Cervicalgia  RATIONALE FOR EVALUATION AND TREATMENT: Rehabilitation  ONSET DATE: Unknown, ongoing for multiple  years  FOLLOW UP APPT WITH PROVIDER: No, not reported  FROM INITIAL EVALUATION (10/04/21) SUBJECTIVE:                                                                                                                                                                                         Chief Complaint: [name]  Pertinent History Pt reports [name] pain for multiple years which has worsened notably over the last year. No known jaw trauma at onset. Along with the jaw pain she also complains of L ear pain and fullness. She describes the pain as constant and achy. Pt confirms grinding her teeth at night with significant clenching during the day. She notices  inflammation occasionally around the [name] when it is really aggravated. She wears a night guard when sleeping and is considering getting an updated one but is concerned that it might be a large out of pocket expense. She gets regular dental examinations and had her last exam approximately 6 months ago. No recent dental procedures or orthodontics. She does not chew gum. Pt reports significant pain with end range depression, eating chewy food, and talking a lot during the day at work.  Last Dental Examination and imaging: Last exam 6 months ago, unsure of last imaging  Recent dental procedures or orthodontics: No  Pain location: [name], masseter, and ear canal Pain Severity: Present: 0-1/10, Best: 0/10, Worst: 5/10 Pain quality: constant and aching  Radiating pain: No Numbness/Tingling: No Aggravating factors: excessive talking, eating chewy foot, opening mouth wide Easing factors: massage, rest, ibuprofen  Clicking, catching, or crepitus during chewing: Yes 24 hour pain behavior: fluctuates History of grinding teeth: Yes at night as well as frequent clenching during the day. Recent or remote jaw/face/neck trauma, injury, or pain: No Falls: Has patient fallen in last 6 months? No; History of prior physical therapy for this issue: No   Imaging: No, pt is unsure of last dental imaging but she has never had specific imaging for this issue Progression (improving, worsening, unchanged): worsening over the last year History of headaches/migraines: Yes, history of headaches and migraines Ear symptoms (tinnitus, fullness, pain): Yes, L tinnitus (sometimes right), L ear fullness, L ear pain Chest pain: No Stress/anxiety: Yes, pt endorses fatigue and stress related to serving as caregiver to her two young children. Dominant hand: right Occupational demands: Endoscopy technician Hobbies: spending time with family, camping Red flags: Positive: night sweats, Negative: personal history of cancer, chills/fever, nausea, vomiting, unrelenting pain, unexplained weight gain/loss  Precautions: None  Weight Bearing Restrictions: No  Patient Goals: Pt wants to be able to open her mouth as wide as necessary without pain;  OBJECTIVE  Mental Status Patient is oriented to person, place and time.  Recent memory is intact.  Remote memory is intact.  Attention span and concentration are intact.  Expressive speech is intact.  Patient's fund of knowledge is within normal limits for educational level.  Cranial Nerves Visual acuity and visual fields are intact  Extraocular muscles are intact  Facial sensation is intact bilaterally  Facial strength is intact bilaterally  Hearing is normal as tested by gross conversation Palate elevates midline, normal phonation  Shoulder shrug strength is intact  Tongue protrudes midline   MUSCULOSKELETAL: Tremor: None Bulk: Normal Tone: Normal Facial Symmetry: Face appears grossly symmetrical but possibly with some deviation of the jaw to the right  Posture Forward head and rounded shoulders in sitting;  Cervical Screen Centralization: Deferred Isometrics: Full and painless in all directions Passive Accessory Intervertebral Motion (PAIVM), central and unilateral (bilaterally): Normal mobility  without reproduction of jaw symptoms however pt does report some cervical tenderness. Spurlings A (ipsilateral lateral flexion/axial compression): R: Negative L: Negative Spurlings B (ipsilateral lateral flexion/contralateral rotation/axial compression): R: Negative L: Negative Cervical Distraction Test: Not examined  Cervical Fexion-Rotation Test: Not examined  Hoffman Sign (cervical cord compression): R: Not examined L: Not examined  AROM AROM (Normal range in degrees) AROM  Cervical  Flexion (50) 50  Extension (80) 38  Right lateral flexion (45) 40*  Left lateral flexion (45) 40*  Right rotation (85)   Left rotation (85)   (* = pain; Blank rows = not tested)  Dermatome/Myotome Screen  N=normal  Ab=abnormal Level Dermatome R L Myotome R L Reflex R L  C2 Posterior Scalp N N Cervical Flexion/Extension C1-2 N N Jaw CN V    C3 Anterior Neck N N Cervical Sidebend C2-3 N N Biceps C5-6    C4 Top of Shoulder N N Shoulder Shrug C4 N N Brachiorad. C5-6    C5 Lateral Upper Arm N N Shoulder ABD C4-5 N N Triceps C7    C6 Lateral Arm/ Thumb N N Arm Flex/ Wrist Ext C5-6 N N     C7 Middle Finger N N Arm Ext//Wrist Flex C6-7 N N     C8 4th & 5th Finger N N Flex/ Ext Carpi Ulnaris C8 N N     T1 Medial Arm N N Interossei T1 N N      Sensation Grossly intact to light touch bilateral face and UE as determined by testing branches of trigeminal nerve and dermatomes C2-T2  Reflexes Not tested  Palpation Graded on 0-4 scale (0 = no pain, 1 = pain, 2 = pain with wincing/grimacing/flinching, 3 = pain with withdrawal, 4 = unwilling to allow palpation) Location LEFT  RIGHT           Temporomandibular Joint (posterior, superior, anterior) 1 0  Temporalis (anterior, middle, posterior fibers) 1 0  Temporalis Tendon Insertion 1 0  Masseter (Zygoma, Body, Lateral surface of angle of mandible) 1 0  Medial ptyergoid 1 1  Frontal Sinus 0 0  Maxillary Sinus 0 0  SCM 1 0  Upper Trapezius 0 0  Subocciptials  0 0   Mandibular AROM Resting Dental Alignment/Occlusion (Overbite, underbite, overjet, crossbite): Lower incisors rest 46mm to the right of upper incisors  Mandible Depression (40-25mm): 41 Mandible Protrusion (3-33mm): 7 Mandible Lateral Excursion (10-1mm): R: 9  L: 13 Audible joint sounds (crepitus or clicking with stethoscope with opening, lateral deviation, and bite): Negative Reciprocal Click (palpation, click with both opening/closing): Positive Deviation of Mouth During Opening: Positive for deviation to the left Deflection of Mouth at End Range: Positive for deviation to the left  Strength R/L Functional Mandible Depression (Jaw Opening): Strong and painless Functional Mandible Protrusion:  Strong and painful on the left Functional Mandible Elevation (Jaw Closing) Strong and painful on the left Functional Mandible Lateral Deviation: Strong and painful on the left with resistance moving bilaterally  Passive Accessory Joint Motion (PAIVM) Deferred  Special Tests Biting on one separator (ipsilateral muscle activation/contralateral joint compression): R: Negative L: Negative Biting on two separators (bilateral muscle activation): R: Negative L: Negative Manual Joint Compression: Positive [name] pain Manual Joint Distraction: Positive for [name] pain  Beighton scale:   LEFT  RIGHT           1. Passive dorsiflexion and hyperextension of the fifth MCP joint beyond 90  0 0   2. Passive apposition of the thumb to the flexor aspect of the forearm  0  0   3. Passive hyperextension of the elbow beyond 10  0  0   4. Passive hyperextension of the knee beyond 10      5. Active forward flexion of the trunk with the knees fully extended so that the palms of the hands rest flat on the floor     TOTAL          MMT MMT (out of 5) Right 02/09/2022 Left 02/09/2022  Cervical (isometric)  Flexion WNL  Extension WNL  Lateral Flexion WNL WNL  Rotation WNL WNL  Shoulder   Flexion 5 5*  (neck pain)  Extension    Abduction 5 5  Internal rotation    External rotation    Horizontal abduction    Horizontal adduction    Lower Trapezius    Rhomboids        Elbow  Flexion 5 5  Extension 5 5  Pronation    Supination        Wrist  Flexion 5 5  Extension 5 5  Radial deviation    Ulnar deviation        MCP  Flexion 5 5  Extension 5 5  Abduction    Adduction    (* = pain; Blank rows = not tested)   TODAY'S TREATMENT    SUBJECTIVE: Pt reports that she is doing alright today. Mild soreness after the last therapy session but not excessive. Her TMJ pain has been mild but her neck pain has been more significant. Overall she reports approximately 60% improvement in TMJ pain since starting with therapy.   PAIN: Mild [name] pain, notable neck pain.    Manual Therapy  Goals updated with patient (see goal section); Prone C2-T5, CPA mobilizations grade II-III, 30s/bout x 1 bout at each level; Prone C2-C5, UPA mobilizations grade II-III, 30s/bout x 1 bouts each per level bilaterally; Supine upper trap and lateral flexion stretches x 45s each bilateral; Suboccipital release x 45s; Manual cervical traction 15s hold/15s relax x 3;   Ther-ex  Supine cervical retractions with overpressure from therapist x 10; Supine manually resisted cervical rotation and lateral flexion x 5 each bilaterally; Nautilus seated lat pull down 70# x 10, 80# x 10; Nautilus seated rows 40# 2 x 10;    Trigger Point Dry Needling (TDN), unbilled Education previously performed with patient regarding potential benefit of TDN. Previously reviewed precautions and risks with patient. Pt provided verbal consent to treatment. In prone using clean technique TDN performed to bilateral upper traps with 2, 0.25 x 40 single needle placements (1 on each side) with local twitch response (LTR) during both placements. Pistoning technique utilized.     Not performed: Supine isometric jaw deviation 6s hold x 6  toward each side; Tongue clicks with pt demonstrating proper technique; Supine manually resisted mandibular depression 3s hold x 10; [name] anterior, and R lateral mobilizations, grade I-II, 20s/bout x 2 bouts each direction;   PATIENT EDUCATION:  Education details: Pt educated throughout session about proper posture and technique with exercises. Improved exercise technique, movement at target joints, use of target muscles after min to mod verbal, visual, tactile cues.  Person educated: Patient  Education method: Explanation Education comprehension: verbalized understanding and returned demonstration   HOME EXERCISE PROGRAM: Access Code: [email or web address] URL: [email or web address] Date: 11/01/2021 Prepared by: Roxana Hires  Exercises - Tongue Clicks TMJ  - 6 x daily - 7 x weekly - 6 reps - Seated Scapular Retraction  - 6 x daily - 7 x weekly - 6 reps - 6s hold - Seated Cervical Retraction  - 6 x daily - 7 x weekly - 6 reps - 6s hold - Isometric Jaw Deviation  - 6 x daily - 7 x weekly - 6 reps - 6s hold - Isometric Jaw Deviation (Mirrored)  - 6 x daily - 7 x weekly - 6 reps - 6s hold  Patient Education - TMJ Parafunctions - Resting Jaw Position   ASSESSMENT:  CLINICAL IMPRESSION: Session today focused on neck pain mostly again today given that her TMJ pain  is doing well. She is still having more neck and upper thoracic pain compared to TMJ pain currently. Updated outcome measures with patient and she reports approximately 60% improvement in TMJ pain since starting with therapy. Her FOTO has improved from 38 to 41 but her NDI remains relatively unchanged since it was last updated. Repeated trigger point dry needling and manual therapy during session today. Progressed strengthening included resisted exercises with the Nautilus machine. Advised pt to follow-up as scheduled. If symptoms persistent pt plans to discuss a specialty nighttime TMJ oral appliance with her dentist. Pt  will benefit from PT services to address deficits in pain and tissue restriction in order to return to full function at home and work with less pain.   REHAB POTENTIAL: Excellent  CLINICAL DECISION MAKING: Evolving/moderate complexity  EVALUATION COMPLEXITY: Moderate   GOALS: Goals reviewed with patient? Yes  SHORT TERM GOALS:   Pt will be independent with HEP to improve strength and decrease TMJ pain to improve pain-free function at home and work. Baseline:  Goal status: ACHIEVED   LONG TERM GOALS: Target date: 01/24/2022  Pt will increase FOTO to at least 48 in order to demonstrate significant improvement in function at home and work related to TMJ pain Baseline: 10/04/21: To be completed; 10/18/21: 38; 11/29/21: 45; 01/24/22: 41 Goal status: PARTIALLY MET  2.  Pt will decrease worst TMJ pain by at least 3 points on the NPRS in order to demonstrate clinically significant reduction in neck pain. Baseline: 10/04/21: worst: 5/10; 11/08/21: 5/10; 01/24/22: 4/10; Goal status: PARTIALLY MET  3.  Pt will decrease NDI score by at least 19% in order demonstrate clinically significant reduction in neck pain/disability.       Baseline: 10/04/21: To be completed; 11/29/21: 26%; 01/24/22: 28 Goal status: ONGOING  4.  Pt will report at least 75% improvement in her pain when opening her mouth to eat and while talking at work in order to return to full function without significant increase in pain. Baseline: 11/08/21: Unchanged; 11/29/21: 30% improvement; 01/24/22: 60% improvement Goal status: PARTIALLY MET   PLAN: PT FREQUENCY: 1-2x/week  PT DURATION: 8 weeks  PLANNED INTERVENTIONS: Therapeutic exercises, Therapeutic activity, Neuromuscular re-education, Balance training, Gait training, Patient/Family education, Joint manipulation, Joint mobilization, Vestibular training, Canalith repositioning, Dry Needling, Electrical stimulation, Spinal manipulation, Spinal mobilization, Cryotherapy, Moist  heat, Taping, Traction, Ultrasound, Ionotophoresis 4mg /ml Dexamethasone, and Manual therapy  PLAN FOR NEXT SESSION: review and modify HEP as needed, progress strengthening;  Jamiyla Ishee PT, DPT, GCS  Toneka Fullen 02/09/2022, 11:18 AM

## 2022-02-14 DIAGNOSIS — M542 Cervicalgia: Secondary | ICD-10-CM

## 2022-02-16 NOTE — H&P (Signed)
Pre-Procedure H&P   Patient ID: Lacey Rodgers is a 42 y.o. female.  Gastroenterology Provider: Annamaria Helling, DO  PCP: Owens Loffler, MD  Date: 02/17/2022  HPI Lacey Rodgers is a 42 y.o. female who presents today for Esophagogastroduodenoscopy for Dysphagia, GERD .  Patient with intermittent solid food dysphagia for several years.  She has noticed this has worsened over the last several months.  She notes this is mostly with solids such as meats and dry breads.    Reflux controlled with PPI.  She denies any appetite or weight changes.  No odynophagia.  Most recent hemoglobin 14 MCV 88 platelets 180,000  Father with history of esophagus lesions requiring dilation, paternal grandmother ulcerative colitis, Brother Crohn's disease, mother diverticulitis  Past Medical History:  Diagnosis Date   Depression    Genital herpes    GERD (gastroesophageal reflux disease)    History of kidney stones    currently   Hypertension    Migraine    Morbid obesity with BMI of 45.0-49.9, adult [facility])     Past Surgical History:  Procedure Laterality Date   CESAREAN SECTION N/A 12/16/2015   Procedure: CESAREAN SECTION;  Surgeon: Will Bonnet, MD;  Location: [facility] ORS;  Service: Obstetrics;  Laterality: N/A;   CESAREAN SECTION WITH BILATERAL TUBAL LIGATION  12/02/2018   Procedure: CESAREAN SECTION WITH BILATERAL TUBAL LIGATION;  Surgeon: Homero Fellers, MD;  Location: [facility] ORS;  Service: Obstetrics;;  TOB 1850 WEIGHT 7lb 5oz LENGTH 19.75 APGAR 9/9   DILATION AND CURETTAGE OF UTERUS  01/2013   INSERTION OF MESH  07/18/2021   Procedure: INSERTION OF MESH;  Surgeon: Robert Bellow, MD;  Location: [facility] ORS;  Service: General;;   VENTRAL HERNIA REPAIR N/A 07/18/2021   Procedure: HERNIA REPAIR VENTRAL ADULT;  Surgeon: Robert Bellow, MD;  Location: [facility] ORS;  Service: General;  Laterality: N/A;  Floyce Stakes, RNFA to assist    Family History Father with  history of esophagus lesions requiring dilation, paternal grandmother ulcerative colitis, Brother Crohn's disease, mother diverticulitis No other h/o GI disease or malignancy  Review of Systems  Constitutional:  Negative for activity change, appetite change, chills, diaphoresis, fatigue, fever and unexpected weight change.  HENT:  Positive for trouble swallowing. Negative for voice change.   Respiratory:  Negative for shortness of breath and wheezing.   Cardiovascular:  Negative for chest pain, palpitations and leg swelling.  Gastrointestinal:  Negative for abdominal distention, abdominal pain, anal bleeding, blood in stool, constipation, diarrhea, nausea, rectal pain and vomiting.  Musculoskeletal:  Negative for arthralgias and myalgias.  Skin:  Negative for color change and pallor.  Neurological:  Negative for dizziness, syncope and weakness.  Psychiatric/Behavioral:  Negative for confusion.   All other systems reviewed and are negative.    Medications No current facility-administered medications on file prior to encounter.   Current Outpatient Medications on File Prior to Encounter  Medication Sig Dispense Refill   buPROPion (WELLBUTRIN SR) 150 MG 12 hr tablet Take 1 tablet (150 mg total) by mouth 2 (two) times daily. 180 tablet 1   escitalopram (LEXAPRO) 20 MG tablet Take 1 tablet (20 mg total) by mouth daily. 90 tablet 1   gabapentin (NEURONTIN) 100 MG capsule Follow titration until 3 capsules three times a day is reached. (Patient taking differently: 100-300 mg as needed.) 270 capsule 3   HYDROcodone-acetaminophen (NORCO/VICODIN) 5-325 MG tablet Take 1 tablet by mouth every 4 (four) hours as needed for moderate  pain. 12 tablet 0   lisinopril-hydrochlorothiazide (ZESTORETIC) 10-12.5 MG tablet Take 1 tablet by mouth daily. 30 tablet 5   Multiple Vitamin (MULTIVITAMIN) tablet Take 1 tablet by mouth daily.     omeprazole (PRILOSEC) 40 MG capsule Take 1 capsule (40 mg total) by mouth  daily. 90 capsule 3   triamcinolone (NASACORT) 55 MCG/ACT AERO nasal inhaler Place into the nose. 16.9 mL 11   valACYclovir (VALTREX) 1000 MG tablet Take 1 tablet (1,000 mg total) by mouth at bedtime. 90 tablet 3    Pertinent medications related to GI and procedure were reviewed by me with the patient prior to the procedure   Current Facility-Administered Medications:    0.9 %  sodium chloride infusion, , Intravenous, Continuous, Annamaria Helling, DO, Last Rate: 20 mL/hr at 02/17/22 0848, Continued from Pre-op at 02/17/22 0848      Allergies  Allergen Reactions   Pseudoeph-Doxylamine-Dm-Apap Hives   Allergies were reviewed by me prior to the procedure  Objective   Body mass index is 47.63 kg/m. Vitals:   02/17/22 0838  BP: (!) 130/93  Pulse: 82  Resp: 20  Temp: (!) 96.6 F (35.9 C)  TempSrc: Temporal  SpO2: 98%  Weight: 107 kg  Height: 4' 11"$  (1.499 m)     Physical Exam Vitals and nursing note reviewed.  Constitutional:      General: She is not in acute distress.    Appearance: Normal appearance. She is not ill-appearing, toxic-appearing or diaphoretic.  HENT:     Head: Normocephalic and atraumatic.     Nose: Nose normal.     Mouth/Throat:     Mouth: Mucous membranes are moist.     Pharynx: Oropharynx is clear.  Eyes:     General: No scleral icterus.    Extraocular Movements: Extraocular movements intact.  Cardiovascular:     Rate and Rhythm: Normal rate and regular rhythm.     Heart sounds: Normal heart sounds. No murmur heard.    No friction rub. No gallop.  Pulmonary:     Effort: Pulmonary effort is normal. No respiratory distress.     Breath sounds: Normal breath sounds. No wheezing, rhonchi or rales.  Abdominal:     General: Bowel sounds are normal. There is no distension.     Palpations: Abdomen is soft.     Tenderness: There is no abdominal tenderness. There is no guarding or rebound.  Musculoskeletal:     Cervical back: Neck supple.      Right lower leg: No edema.     Left lower leg: No edema.  Skin:    General: Skin is warm and dry.     Coloration: Skin is not jaundiced or pale.  Neurological:     General: No focal deficit present.     Mental Status: She is alert and oriented to person, place, and time. Mental status is at baseline.  Psychiatric:        Mood and Affect: Mood normal.        Behavior: Behavior normal.        Thought Content: Thought content normal.        Judgment: Judgment normal.      Assessment:  Ms. Lacey Rodgers is a 42 y.o. female  who presents today for Esophagogastroduodenoscopy for dysphagia, gerd.  Plan:  Esophagogastroduodenoscopy with possible intervention today  Esophagogastroduodenoscopy with possible biopsy, control of bleeding, polypectomy, and interventions as necessary has been discussed with the patient/patient representative. Informed consent was obtained from  the patient/patient representative after explaining the indication, nature, and risks of the procedure including but not limited to death, bleeding, perforation, missed neoplasm/lesions, cardiorespiratory compromise, and reaction to medications. Opportunity for questions was given and appropriate answers were provided. Patient/patient representative has verbalized understanding is amenable to undergoing the procedure.   Annamaria Helling, DO  [facility]  Portions of the record may have been created with voice recognition software. Occasional wrong-word or 'sound-a-like' substitutions may have occurred due to the inherent limitations of voice recognition software.  Read the chart carefully and recognize, using context, where substitutions may have occurred.

## 2022-02-17 ENCOUNTER — Ambulatory Visit
Admission: RE | Admit: 2022-02-17 | Discharge: 2022-02-17 | Disposition: A | Discharge status: Home / Self Care | Payer: 59 | Attending: Gastroenterology | Admitting: Gastroenterology

## 2022-02-17 ENCOUNTER — Encounter
Admission: RE | Disposition: A | Discharge status: Home / Self Care | Payer: Self-pay | Source: Home / Self Care | Attending: Gastroenterology

## 2022-02-17 ENCOUNTER — Ambulatory Visit: Payer: 59 | Admitting: Certified Registered"

## 2022-02-17 ENCOUNTER — Encounter: Payer: Self-pay | Admitting: Gastroenterology

## 2022-02-17 DIAGNOSIS — Z6841 Body Mass Index (BMI) 40.0 and over, adult: Secondary | ICD-10-CM | POA: Diagnosis not present

## 2022-02-17 DIAGNOSIS — K319 Disease of stomach and duodenum, unspecified: Secondary | ICD-10-CM | POA: Diagnosis not present

## 2022-02-17 DIAGNOSIS — Z79899 Other long term (current) drug therapy: Secondary | ICD-10-CM | POA: Diagnosis not present

## 2022-02-17 DIAGNOSIS — K219 Gastro-esophageal reflux disease without esophagitis: Secondary | ICD-10-CM | POA: Diagnosis not present

## 2022-02-17 DIAGNOSIS — R1310 Dysphagia, unspecified: Secondary | ICD-10-CM | POA: Diagnosis not present

## 2022-02-17 DIAGNOSIS — K2289 Other specified disease of esophagus: Secondary | ICD-10-CM | POA: Diagnosis not present

## 2022-02-17 DIAGNOSIS — I10 Essential (primary) hypertension: Secondary | ICD-10-CM | POA: Insufficient documentation

## 2022-02-17 DIAGNOSIS — K3189 Other diseases of stomach and duodenum: Secondary | ICD-10-CM | POA: Diagnosis not present

## 2022-02-17 DIAGNOSIS — Z8379 Family history of other diseases of the digestive system: Secondary | ICD-10-CM | POA: Diagnosis not present

## 2022-02-17 DIAGNOSIS — K224 Dyskinesia of esophagus: Secondary | ICD-10-CM | POA: Diagnosis not present

## 2022-02-17 DIAGNOSIS — K2970 Gastritis, unspecified, without bleeding: Secondary | ICD-10-CM | POA: Diagnosis not present

## 2022-02-17 DIAGNOSIS — K259 Gastric ulcer, unspecified as acute or chronic, without hemorrhage or perforation: Secondary | ICD-10-CM | POA: Insufficient documentation

## 2022-02-17 DIAGNOSIS — Z87891 Personal history of nicotine dependence: Secondary | ICD-10-CM | POA: Diagnosis not present

## 2022-02-17 HISTORY — PX: ESOPHAGOGASTRODUODENOSCOPY: SHX5428

## 2022-02-17 LAB — POCT PREGNANCY, URINE: Preg Test, Ur: NEGATIVE

## 2022-02-17 SURGERY — EGD (ESOPHAGOGASTRODUODENOSCOPY)
Anesthesia: General

## 2022-02-17 MED ORDER — SODIUM CHLORIDE 0.9 % IV SOLN
INTRAVENOUS | Status: DC
  Administered 2022-02-17: 20 mL/h via INTRAVENOUS

## 2022-02-17 MED ORDER — MIDAZOLAM HCL 2 MG/2ML IJ SOLN
INTRAMUSCULAR | Status: DC | PRN
  Administered 2022-02-17: 2 mg via INTRAVENOUS

## 2022-02-17 MED ORDER — LIDOCAINE HCL (CARDIAC) PF 100 MG/5ML IV SOSY
PREFILLED_SYRINGE | INTRAVENOUS | Status: DC | PRN
  Administered 2022-02-17: 50 mg via INTRAVENOUS

## 2022-02-17 MED ORDER — DEXMEDETOMIDINE HCL IN NACL 80 MCG/20ML IV SOLN
INTRAVENOUS | Status: DC | PRN
  Administered 2022-02-17: 8 ug via BUCCAL

## 2022-02-17 MED ORDER — PROPOFOL 10 MG/ML IV BOLUS
INTRAVENOUS | Status: DC | PRN
  Administered 2022-02-17: 20 mg via INTRAVENOUS
  Administered 2022-02-17: 50 mg via INTRAVENOUS
  Administered 2022-02-17 (×2): 20 mg via INTRAVENOUS

## 2022-02-17 MED ORDER — MIDAZOLAM HCL 2 MG/2ML IJ SOLN
INTRAMUSCULAR | Status: AC
  Filled 2022-02-17: qty 2

## 2022-02-17 NOTE — Anesthesia Postprocedure Evaluation (Signed)
Anesthesia Post Note  Patient: Lacey Rodgers  Procedure(s) Performed: ESOPHAGOGASTRODUODENOSCOPY (EGD)  Patient location during evaluation: PACU Anesthesia Type: General Level of consciousness: awake Pain management: pain level controlled Vital Signs Assessment: post-procedure vital signs reviewed and stable Respiratory status: spontaneous breathing and nonlabored ventilation Cardiovascular status: stable Anesthetic complications: no   No notable events documented.   Last Vitals:  Vitals:   02/17/22 0933 02/17/22 0943  BP: 128/77 112/72  Pulse: 77   Resp: 17   Temp: (!) 36.2 C   SpO2: 97%     Last Pain:  Vitals:   02/17/22 0943  TempSrc:   PainSc: 0-No pain                 VAN STAVEREN,Chessie Neuharth

## 2022-02-17 NOTE — Anesthesia Procedure Notes (Signed)
Procedure Name: MAC Date/Time: 02/17/2022 9:15 AM  Performed by: Jerrye Noble, CRNAPre-anesthesia Checklist: Patient identified, Emergency Drugs available, Suction available and Patient being monitored Patient Re-evaluated:Patient Re-evaluated prior to induction Oxygen Delivery Method: Nasal cannula

## 2022-02-17 NOTE — Transfer of Care (Signed)
Immediate Anesthesia Transfer of Care Note  Patient: NIOKA SETZLER  Procedure(s) Performed: ESOPHAGOGASTRODUODENOSCOPY (EGD)  Patient Location: PACU and Endoscopy Unit  Anesthesia Type:General  Level of Consciousness: awake, drowsy, and patient cooperative  Airway & Oxygen Therapy: Patient Spontanous Breathing  Post-op Assessment: Report given to RN and Post -op Vital signs reviewed and stable  Post vital signs: Reviewed and stable  Last Vitals:  Vitals Value Taken Time  BP 128/77 02/17/22 0935  Temp 36.2 C 02/17/22 0933  Pulse 74 02/17/22 0935  Resp 17 02/17/22 0935  SpO2 97 % 02/17/22 0935  Vitals shown include unvalidated device data.  Last Pain:  Vitals:   02/17/22 0933  TempSrc: Temporal  PainSc: 0-No pain         Complications: No notable events documented.

## 2022-02-17 NOTE — Interval H&P Note (Signed)
History and Physical Interval Note: Preprocedure H&P from 02/17/22  was reviewed and there was no interval change after seeing and examining the patient.  Written consent was obtained from the patient after discussion of risks, benefits, and alternatives. Patient has consented to proceed with Esophagogastroduodenoscopy with possible intervention   02/17/2022 9:12 AM  Lacey Rodgers  has presented today for surgery, with the diagnosis of GERD DYSPHAGIA.  The various methods of treatment have been discussed with the patient and family. After consideration of risks, benefits and other options for treatment, the patient has consented to  Procedure(s) with comments: ESOPHAGOGASTRODUODENOSCOPY (EGD) (N/A) - DO NOT CALL! as a surgical intervention.  The patient's history has been reviewed, patient examined, no change in status, stable for surgery.  I have reviewed the patient's chart and labs.  Questions were answered to the patient's satisfaction.     Annamaria Helling

## 2022-02-17 NOTE — Op Note (Addendum)
[facility] Gastroenterology Patient Name: Lacey Rodgers Procedure Date: 02/17/2022 9:08 AM MRN: [id number] Account #: [id number] Date of Birth: 02-Mar-1980 Admit Type: Outpatient Age: 42 Room: [facility] ENDO ROOM 1 Gender: Female Note Status: Finalized Instrument Name: Altamese Cabal Endoscope [id number] Procedure:             Upper GI endoscopy Indications:           Dysphagia Providers:             Annamaria Helling DO, DO Referring MD:          Maud Deed. Copland MD, MD (Referring MD) Medicines:             Monitored Anesthesia Care Complications:         No immediate complications. Estimated blood loss:                         Minimal. Procedure:             Pre-Anesthesia Assessment:                        - Prior to the procedure, a History and Physical was                         performed, and patient medications and allergies were                         reviewed. The patient is competent. The risks and                         benefits of the procedure and the sedation options and                         risks were discussed with the patient. All questions                         were answered and informed consent was obtained.                         Patient identification and proposed procedure were                         verified by the physician, the nurse, the anesthetist                         and the technician in the endoscopy suite. Mental                         Status Examination: alert and oriented. Airway                         Examination: normal oropharyngeal airway and neck                         mobility. Respiratory Examination: clear to                         auscultation. CV Examination: RRR, no murmurs, no S3  or S4. Prophylactic Antibiotics: The patient does not                         require prophylactic antibiotics. Prior                         Anticoagulants: The patient has taken no anticoagulant                          or antiplatelet agents. ASA Grade Assessment: II - A                         patient with mild systemic disease. After reviewing                         the risks and benefits, the patient was deemed in                         satisfactory condition to undergo the procedure. The                         anesthesia plan was to use monitored anesthesia care                         (MAC). Immediately prior to administration of                         medications, the patient was re-assessed for adequacy                         to receive sedatives. The heart rate, respiratory                         rate, oxygen saturations, blood pressure, adequacy of                         pulmonary ventilation, and response to care were                         monitored throughout the procedure. The physical                         status of the patient was re-assessed after the                         procedure.                        After obtaining informed consent, the endoscope was                         passed under direct vision. Throughout the procedure,                         the patient's blood pressure, pulse, and oxygen                         saturations were monitored continuously. The Endoscope  was introduced through the mouth, and advanced to the                         second part of duodenum. The upper GI endoscopy was                         accomplished without difficulty. The patient tolerated                         the procedure well. Findings:      The duodenal bulb, first portion of the duodenum and second portion of       the duodenum were normal. Estimated blood loss: none.      Three non-bleeding superficial gastric ulcers with a clean ulcer base       (Forrest Class III) were found in the gastric antrum. The largest lesion       was 2 mm in largest dimension. Biopsies were taken with a cold forceps       for histology.      Normal mucosa was  found in the cardia, in the gastric fundus, in the       gastric body and at the incisura. Biopsies were taken with a cold       forceps for Helicobacter pylori testing. Estimated blood loss was       minimal.      The Z-line was regular. Estimated blood loss: none.      Esophagogastric landmarks were identified: the gastroesophageal junction       was found at 35 cm from the incisors.      Abnormal motility was noted in the esophagus. The cricopharyngeus was       normal. There is spasticity of the esophageal body. The distal       esophagus/lower esophageal sphincter is open. The scope was withdrawn.       Dilation was performed with a Maloney dilator with no resistance at 52       Fr. The dilation site was examined following endoscope reinsertion and       showed no change.      Normal mucosa was found in the entire esophagus. Biopsies were obtained       from the proximal and distal esophagus with cold forceps for histology       of suspected eosinophilic esophagitis. Estimated blood loss was minimal.      The exam was otherwise without abnormality. Impression:            - Normal duodenal bulb, first portion of the duodenum                         and second portion of the duodenum.                        - Non-bleeding gastric ulcers with a clean ulcer base                         (Forrest Class III). Biopsied.                        - Normal mucosa was found in the cardia, in the  gastric fundus, in the gastric body and in the                         incisura. Biopsied.                        - Z-line regular.                        - Esophagogastric landmarks identified.                        - Abnormal esophageal motility, suspicious for                         esophageal spasm. Dilated.                        - Normal mucosa was found in the entire esophagus.                        - The examination was otherwise normal.                        -  Biopsies were taken with a cold forceps for                         evaluation of eosinophilic esophagitis. Recommendation:        - Patient has a contact number available for                         emergencies. The signs and symptoms of potential                         delayed complications were discussed with the patient.                         Return to normal activities tomorrow. Written                         discharge instructions were provided to the patient.                        - Discharge patient to home.                        - Resume previous diet.                        - Continue present medications.                        - Await pathology results.                        - Return to GI clinic as previously scheduled.                        - The findings and recommendations were discussed with  the patient. Procedure Code(s):     --- Professional ---                        [phone], Esophagogastroduodenoscopy, flexible,                         transoral; with biopsy, single or multiple                        43450, Dilation of esophagus, by unguided sound or                         bougie, single or multiple passes Diagnosis Code(s):     --- Professional ---                        K25.9, Gastric ulcer, unspecified as acute or chronic,                         without hemorrhage or perforation                        K22.4, Dyskinesia of esophagus                        R13.10, Dysphagia, unspecified CPT copyright 2022 American Medical Association. All rights reserved. The codes documented in this report are preliminary and upon coder review may  be revised to meet current compliance requirements. Attending Participation:      I personally performed the entire procedure. Volney American, DO Annamaria Helling DO, DO 02/17/2022 9:36:42 AM This report has been signed electronically. Number of Addenda: 0 Note Initiated On: 02/17/2022 9:08  AM Estimated Blood Loss:  Estimated blood loss was minimal.      [facility]

## 2022-02-17 NOTE — Anesthesia Preprocedure Evaluation (Signed)
Anesthesia Evaluation  Patient identified by MRN, date of birth, ID band Patient awake    Reviewed: Allergy & Precautions, NPO status , Patient's Chart, lab work & pertinent test results  Airway Mallampati: II  TM Distance: >3 FB Neck ROM: full    Dental  (+) Teeth Intact   Pulmonary neg pulmonary ROS, former smoker   Pulmonary exam normal breath sounds clear to auscultation       Cardiovascular Exercise Tolerance: Good hypertension, Pt. on medications negative cardio ROS Normal cardiovascular exam Rhythm:Regular Rate:Normal     Neuro/Psych  Headaches negative neurological ROS  negative psych ROS   GI/Hepatic negative GI ROS, Neg liver ROS,GERD  Medicated,,  Endo/Other  negative endocrine ROS    Renal/GU negative Renal ROS  negative genitourinary   Musculoskeletal   Abdominal  (+) + obese  Peds negative pediatric ROS (+)  Hematology negative hematology ROS (+)   Anesthesia Other Findings Past Medical History: No date: Depression No date: Genital herpes No date: GERD (gastroesophageal reflux disease) No date: History of kidney stones     Comment:  currently No date: Hypertension No date: Migraine No date: Morbid obesity with BMI of 45.0-49.9, adult [facility])  Past Surgical History: 12/16/2015: CESAREAN SECTION; N/A     Comment:  Procedure: CESAREAN SECTION;  Surgeon: Will Bonnet, MD;  Location: [facility] ORS;  Service: Obstetrics;                Laterality: N/A; 12/02/2018: CESAREAN SECTION WITH BILATERAL TUBAL LIGATION     Comment:  Procedure: CESAREAN SECTION WITH BILATERAL TUBAL               LIGATION;  Surgeon: Homero Fellers, MD;  Location:              [facility] ORS;  Service: Obstetrics;;  TOB 1850 WEIGHT 7lb               5oz LENGTH 19.75 APGAR 9/9 01/2013: DILATION AND CURETTAGE OF UTERUS 07/18/2021: INSERTION OF MESH     Comment:  Procedure: INSERTION OF MESH;  Surgeon: Robert Bellow, MD;  Location: [facility] ORS;  Service: General;; 07/18/2021: VENTRAL HERNIA REPAIR; N/A     Comment:  Procedure: HERNIA REPAIR VENTRAL ADULT;  Surgeon:               Robert Bellow, MD;  Location: [facility] ORS;  Service:               General;  Laterality: N/A;  Floyce Stakes, RNFA to               assist  BMI    Body Mass Index: 47.63 kg/m      Reproductive/Obstetrics negative OB ROS                             Anesthesia Physical Anesthesia Plan  ASA: 2  Anesthesia Plan: General   Post-op Pain Management:    Induction: Intravenous  PONV Risk Score and Plan: Propofol infusion and TIVA  Airway Management Planned: Natural Airway and Nasal Cannula  Additional Equipment:   Intra-op Plan:   Post-operative Plan:   Informed Consent: I have reviewed the patients History and Physical, chart, labs and discussed the procedure including the risks, benefits and alternatives for  the proposed anesthesia with the patient or authorized representative who has indicated his/her understanding and acceptance.     Dental Advisory Given  Plan Discussed with: CRNA and Surgeon  Anesthesia Plan Comments:        Anesthesia Quick Evaluation

## 2022-02-20 ENCOUNTER — Other Ambulatory Visit: Payer: Self-pay

## 2022-02-20 ENCOUNTER — Encounter: Payer: Self-pay | Admitting: Gastroenterology

## 2022-02-20 LAB — SURGICAL PATHOLOGY

## 2022-02-20 MED ORDER — OMEPRAZOLE 40 MG PO CPDR
40.0000 mg | DELAYED_RELEASE_CAPSULE | Freq: Two times a day (BID) | ORAL | 2 refills | Status: DC
  Filled 2022-02-20 – 2022-03-13 (×2): qty 60, 30d supply, fill #0
  Filled 2022-09-17: qty 60, 30d supply, fill #1

## 2022-03-13 ENCOUNTER — Other Ambulatory Visit: Payer: Self-pay

## 2022-04-20 ENCOUNTER — Other Ambulatory Visit: Payer: Self-pay

## 2022-05-02 ENCOUNTER — Other Ambulatory Visit: Payer: Self-pay

## 2022-05-02 MED FILL — Valacyclovir HCl Tab 1 GM: ORAL | 90 days supply | Qty: 90 | Fill #2 | Status: AC

## 2022-05-22 ENCOUNTER — Other Ambulatory Visit: Payer: Self-pay

## 2022-05-22 ENCOUNTER — Other Ambulatory Visit: Payer: Self-pay | Admitting: Family Medicine

## 2022-05-23 ENCOUNTER — Other Ambulatory Visit: Payer: Self-pay

## 2022-05-23 MED ORDER — LISINOPRIL-HYDROCHLOROTHIAZIDE 10-12.5 MG PO TABS
1.0000 | ORAL_TABLET | Freq: Every day | ORAL | 1 refills | Status: DC
  Filled 2022-05-23: qty 90, 90d supply, fill #0

## 2022-06-12 ENCOUNTER — Encounter: Payer: Self-pay | Admitting: Family Medicine

## 2022-06-12 ENCOUNTER — Other Ambulatory Visit: Payer: Self-pay

## 2022-06-12 MED ORDER — AMLODIPINE BESYLATE 10 MG PO TABS
10.0000 mg | ORAL_TABLET | Freq: Every day | ORAL | 1 refills | Status: DC
  Filled 2022-06-12: qty 90, 90d supply, fill #0

## 2022-06-19 ENCOUNTER — Encounter: Payer: Self-pay | Admitting: Family Medicine

## 2022-06-19 ENCOUNTER — Other Ambulatory Visit: Payer: Self-pay

## 2022-06-19 MED ORDER — HYDROCHLOROTHIAZIDE 12.5 MG PO TABS
12.5000 mg | ORAL_TABLET | Freq: Every day | ORAL | 3 refills | Status: DC
  Filled 2022-06-19: qty 30, 30d supply, fill #0

## 2022-06-22 ENCOUNTER — Other Ambulatory Visit: Payer: Self-pay

## 2022-06-22 MED ORDER — ESOMEPRAZOLE MAGNESIUM 40 MG PO CPDR
40.0000 mg | DELAYED_RELEASE_CAPSULE | Freq: Two times a day (BID) | ORAL | 6 refills | Status: DC
  Filled 2022-06-22: qty 60, 30d supply, fill #0
  Filled 2022-08-14: qty 60, 30d supply, fill #1
  Filled 2022-09-17: qty 60, 30d supply, fill #2
  Filled 2022-10-22 – 2022-10-25 (×2): qty 60, 30d supply, fill #3
  Filled 2022-11-26: qty 60, 30d supply, fill #4
  Filled 2023-01-01: qty 60, 30d supply, fill #5
  Filled 2023-02-02: qty 60, 30d supply, fill #6

## 2022-07-18 ENCOUNTER — Encounter: Payer: Self-pay | Admitting: Family Medicine

## 2022-07-19 ENCOUNTER — Other Ambulatory Visit: Payer: Self-pay

## 2022-07-19 MED ORDER — HYDROCHLOROTHIAZIDE 25 MG PO TABS
25.0000 mg | ORAL_TABLET | Freq: Every day | ORAL | 1 refills | Status: DC
  Filled 2022-07-19: qty 90, 90d supply, fill #0
  Filled 2022-09-17 – 2022-10-25 (×3): qty 90, 90d supply, fill #1

## 2022-07-28 ENCOUNTER — Telehealth: Payer: Self-pay | Admitting: Family Medicine

## 2022-07-28 ENCOUNTER — Encounter: Payer: Self-pay | Admitting: Family Medicine

## 2022-07-28 ENCOUNTER — Other Ambulatory Visit: Payer: Self-pay | Admitting: Family Medicine

## 2022-07-28 DIAGNOSIS — E559 Vitamin D deficiency, unspecified: Secondary | ICD-10-CM

## 2022-07-28 DIAGNOSIS — Z13220 Encounter for screening for lipoid disorders: Secondary | ICD-10-CM

## 2022-07-28 DIAGNOSIS — Z79899 Other long term (current) drug therapy: Secondary | ICD-10-CM

## 2022-07-28 DIAGNOSIS — Z131 Encounter for screening for diabetes mellitus: Secondary | ICD-10-CM

## 2022-07-28 NOTE — Telephone Encounter (Signed)
Done. Patient alerted via Mychart.

## 2022-07-28 NOTE — Telephone Encounter (Signed)
Do I order these the same way that I normally would, or do I need to change how they are ordered?

## 2022-07-28 NOTE — Telephone Encounter (Signed)
Contacted pt to schedule her cpe for this fall. Pt requested, if possible, for lab orders to be submitted where she works, [employer]? Call back # [phone]

## 2022-08-14 ENCOUNTER — Other Ambulatory Visit: Payer: Self-pay

## 2022-08-14 ENCOUNTER — Other Ambulatory Visit: Payer: Self-pay | Admitting: Family Medicine

## 2022-08-14 DIAGNOSIS — F32A Depression, unspecified: Secondary | ICD-10-CM

## 2022-08-14 MED ORDER — ESCITALOPRAM OXALATE 20 MG PO TABS
20.0000 mg | ORAL_TABLET | Freq: Every day | ORAL | 0 refills | Status: DC
  Filled 2022-08-14: qty 90, 90d supply, fill #0

## 2022-08-14 MED ORDER — LOSARTAN POTASSIUM 25 MG PO TABS
25.0000 mg | ORAL_TABLET | Freq: Every day | ORAL | 3 refills | Status: DC
  Filled 2022-08-14: qty 30, 30d supply, fill #0
  Filled 2022-09-17: qty 30, 30d supply, fill #1
  Filled 2022-10-22 – 2022-10-25 (×2): qty 30, 30d supply, fill #2
  Filled 2022-11-26: qty 30, 30d supply, fill #3

## 2022-09-17 ENCOUNTER — Other Ambulatory Visit: Payer: Self-pay

## 2022-09-17 ENCOUNTER — Other Ambulatory Visit: Payer: Self-pay | Admitting: Family Medicine

## 2022-09-17 MED FILL — Valacyclovir HCl Tab 1 GM: ORAL | 90 days supply | Qty: 90 | Fill #3 | Status: AC

## 2022-09-18 ENCOUNTER — Other Ambulatory Visit: Payer: Self-pay

## 2022-09-18 MED ORDER — BUPROPION HCL ER (SR) 150 MG PO TB12
150.0000 mg | ORAL_TABLET | Freq: Two times a day (BID) | ORAL | 0 refills | Status: DC
  Filled 2022-09-18: qty 180, 90d supply, fill #0

## 2022-10-06 ENCOUNTER — Other Ambulatory Visit: Payer: Self-pay | Admitting: Family Medicine

## 2022-10-06 DIAGNOSIS — Z1231 Encounter for screening mammogram for malignant neoplasm of breast: Secondary | ICD-10-CM

## 2022-10-11 ENCOUNTER — Encounter: Payer: 59 | Admitting: Family Medicine

## 2022-10-19 ENCOUNTER — Ambulatory Visit
Admission: RE | Admit: 2022-10-19 | Discharge: 2022-10-19 | Disposition: A | Discharge status: Home / Self Care | Payer: 59 | Source: Ambulatory Visit | Attending: Family Medicine | Admitting: Family Medicine

## 2022-10-19 DIAGNOSIS — Z1231 Encounter for screening mammogram for malignant neoplasm of breast: Secondary | ICD-10-CM | POA: Insufficient documentation

## 2022-10-23 ENCOUNTER — Other Ambulatory Visit: Payer: Self-pay

## 2022-10-24 ENCOUNTER — Other Ambulatory Visit (HOSPITAL_COMMUNITY): Payer: Self-pay

## 2022-10-24 ENCOUNTER — Other Ambulatory Visit: Payer: Self-pay

## 2022-10-25 ENCOUNTER — Other Ambulatory Visit: Payer: Self-pay

## 2022-10-26 ENCOUNTER — Other Ambulatory Visit: Payer: Self-pay

## 2022-11-26 ENCOUNTER — Other Ambulatory Visit: Payer: Self-pay | Admitting: Family Medicine

## 2022-11-26 DIAGNOSIS — F419 Anxiety disorder, unspecified: Secondary | ICD-10-CM

## 2022-11-27 ENCOUNTER — Other Ambulatory Visit: Payer: Self-pay

## 2022-11-27 MED ORDER — ESCITALOPRAM OXALATE 20 MG PO TABS
20.0000 mg | ORAL_TABLET | Freq: Every day | ORAL | 0 refills | Status: DC
  Filled 2022-11-27: qty 90, 90d supply, fill #0

## 2022-12-05 NOTE — Progress Notes (Unsigned)
GYNECOLOGY PROGRESS NOTE  Subjective:  PCP: [name], MD  Patient ID: Lacey Rodgers, female    DOB: April 06, 1980, 42 y.o.   MRN: [id number]  HPI  Patient is a 42 y.o. [id number] female who presents for chronic LLQ pain for several months. This pain is dull and achy, sometimes becomes sharp, is there daily all day. Is worse on her periods and worse when sitting. Is a little better when lying down. Does not take any medication to alleviate. Doesn't seem to be related to her bowel movements. Also had first episode of intermenstrual bleeding with her last period. Once it finished, two weeks later, she had 3 days of light bleeding. This has never happened before. Pt states she has had an ovarian cyst on her right side in the past but never had pain. Her periods are regular, every 30 days, last 5-7 days and are quite heavy on the first 1-2 days, will change tampon Q1hr. Flow lightens during the following days. Cramping is moderate, doesn't miss work for symptoms. Also with hx of several abdominal surgeries, including two prior cesareans and a ventral hernia repair in her c-section scar on the right.   Period Cycle (Days): 30 Period Duration (Days): 5-7 Period Pattern: Regular Menstrual Flow: Heavy, Moderate, Light Menstrual Control Change Freq (Hours): 1-2 Dysmenorrhea: (!) Moderate Dysmenorrhea Symptoms: Cramping  OB History     Gravida  4   Para  2   Term  2   Preterm  0   AB  2   Living  2      SAB      IAB      Ectopic      Multiple  0   Live Births  2          Past Medical History:  Diagnosis Date   Depression    Genital herpes    GERD (gastroesophageal reflux disease)    History of kidney stones    currently   Hypertension    Migraine    Morbid obesity with BMI of 45.0-49.9, adult [facility])    Past Surgical History:  Procedure Laterality Date   CESAREAN SECTION N/A 12/16/2015   Procedure: CESAREAN SECTION;  Surgeon: Conard Novak, MD;  Location:  [facility] ORS;  Service: Obstetrics;  Laterality: N/A;   CESAREAN SECTION WITH BILATERAL TUBAL LIGATION  12/02/2018   Procedure: CESAREAN SECTION WITH BILATERAL TUBAL LIGATION;  Surgeon: Natale Milch, MD;  Location: [facility] ORS;  Service: Obstetrics;;  TOB 1850 WEIGHT 7lb 5oz LENGTH 19.75 APGAR 9/9   DILATION AND CURETTAGE OF UTERUS  01/2013   ESOPHAGOGASTRODUODENOSCOPY N/A 02/17/2022   Procedure: ESOPHAGOGASTRODUODENOSCOPY (EGD);  Surgeon: Jaynie Collins, DO;  Location: [facility] ENDOSCOPY;  Service: Gastroenterology;  Laterality: N/A;  DO NOT CALL!   INSERTION OF MESH  07/18/2021   Procedure: INSERTION OF MESH;  Surgeon: Earline Mayotte, MD;  Location: [facility] ORS;  Service: General;;   VENTRAL HERNIA REPAIR N/A 07/18/2021   Procedure: HERNIA REPAIR VENTRAL ADULT;  Surgeon: Earline Mayotte, MD;  Location: [facility] ORS;  Service: General;  Laterality: N/A;  Sonda Rumble, RNFA to assist   Family History  Problem Relation Age of Onset   Hypertension Mother    Hypertension Father    Hypertension Brother    Cancer Brother 66   Hypertension Maternal Grandmother    Hypertension Maternal Grandfather    Hypertension Paternal Grandmother    Hypertension Paternal Grandfather    Patient Active Problem List  Diagnosis Date Noted   HSV-2 (herpes simplex virus 2) infection 02/28/2021   Numbness and tingling in both hands 06/24/2020   BMI 45.0-49.9, adult (HCC) 05/22/2018   Moderate recurrent major depression (HCC) 02/08/2017   Migraine aura, persistent 10/10/2012   Genital herpes 10/23/2008   Migraine without aura 10/23/2008   Essential hypertension 10/23/2008   GERD 10/23/2008   Current Outpatient Medications on File Prior to Visit  Medication Sig Dispense Refill   buPROPion (WELLBUTRIN SR) 150 MG 12 hr tablet Take 1 tablet (150 mg total) by mouth 2 (two) times daily. 180 tablet 0   escitalopram (LEXAPRO) 20 MG tablet Take 1 tablet (20 mg total) by mouth daily. 90 tablet 0    esomeprazole (NEXIUM) 40 MG capsule Take 1 capsule (40 mg total) by mouth 2 (two) times daily 30 minutes before breakfast and supper 60 capsule 6   gabapentin (NEURONTIN) 100 MG capsule Follow titration until 3 capsules three times a day is reached. (Patient taking differently: 100-300 mg as needed.) 270 capsule 3   hydrochlorothiazide (HYDRODIURIL) 25 MG tablet Take 1 tablet (25 mg total) by mouth daily. 90 tablet 1   losartan (COZAAR) 25 MG tablet Take 1 tablet (25 mg total) by mouth daily. 30 tablet 3   Multiple Vitamin (MULTIVITAMIN) tablet Take 1 tablet by mouth daily.     triamcinolone (NASACORT) 55 MCG/ACT AERO nasal inhaler Place into the nose. 16.9 mL 11   valACYclovir (VALTREX) 1000 MG tablet Take 1 tablet (1,000 mg total) by mouth at bedtime. 90 tablet 3   amLODipine (NORVASC) 10 MG tablet Take 1 tablet (10 mg total) by mouth daily. (Patient not taking: Reported on 12/06/2022) 90 tablet 1   [DISCONTINUED] omeprazole (PRILOSEC) 40 MG capsule Take 1 capsule (40 mg total) by mouth daily. 90 capsule 3   No current facility-administered medications on file prior to visit.   Allergies  Allergen Reactions   Pseudoeph-Doxylamine-Dm-Apap Hives   The following portions of the patient's history were reviewed and updated as appropriate: allergies, current medications, past family history, past medical history, past social history, past surgical history, and problem list.  Review of Systems Pertinent items are noted in HPI.   Objective:   Blood pressure (!) 130/90, pulse 82, height 4\' 11"  (1.499 m), weight 241 lb (109.3 kg), last menstrual period 11/28/2022. Body mass index is 48.68 kg/m.  General appearance: alert, cooperative, and morbidly obese Abdomen: abnormal findings:  obese and mild tenderness in the LLQ and under her pannicular fold .  Pelvic: cervix normal in appearance, external genitalia normal, no adnexal masses or tenderness, no cervical motion tenderness, uterus normal size,  shape, and consistency, and vagina normal without discharge. Unable to elicit pain with pelvic and bimanual.  Extremities: extremities normal, atraumatic, no cyanosis or edema Neurologic: Grossly normal  Assessment/Plan:   1. Chronic left lower quadrant pain   2. Abnormal uterine bleeding (AUB)    42yo [id number] with chronic LLQ pain and first episode of intermenstrual bleeding.  -Pelvic US to assess for gyn etiology of both, do not suspect based on location of pain during exam being higher that adnexal region, but will assess and follow up with patient for findings.    Julieanne Manson, DO [facility]

## 2022-12-06 ENCOUNTER — Ambulatory Visit: Payer: 59 | Admitting: Obstetrics

## 2022-12-06 ENCOUNTER — Encounter: Payer: Self-pay | Admitting: Obstetrics

## 2022-12-06 VITALS — BP 130/90 | HR 82 | Ht 59.0 in | Wt 241.0 lb

## 2022-12-06 DIAGNOSIS — G8929 Other chronic pain: Secondary | ICD-10-CM

## 2022-12-06 DIAGNOSIS — N939 Abnormal uterine and vaginal bleeding, unspecified: Secondary | ICD-10-CM | POA: Diagnosis not present

## 2022-12-06 DIAGNOSIS — R1032 Left lower quadrant pain: Secondary | ICD-10-CM | POA: Diagnosis not present

## 2022-12-07 ENCOUNTER — Other Ambulatory Visit
Admission: RE | Admit: 2022-12-07 | Discharge: 2022-12-07 | Disposition: A | Discharge status: Home / Self Care | Payer: 59 | Attending: Family Medicine | Admitting: Family Medicine

## 2022-12-07 DIAGNOSIS — E559 Vitamin D deficiency, unspecified: Secondary | ICD-10-CM | POA: Insufficient documentation

## 2022-12-07 DIAGNOSIS — Z131 Encounter for screening for diabetes mellitus: Secondary | ICD-10-CM | POA: Diagnosis not present

## 2022-12-07 DIAGNOSIS — Z79899 Other long term (current) drug therapy: Secondary | ICD-10-CM | POA: Diagnosis not present

## 2022-12-07 DIAGNOSIS — Z13220 Encounter for screening for lipoid disorders: Secondary | ICD-10-CM | POA: Insufficient documentation

## 2022-12-07 LAB — CBC WITH DIFFERENTIAL/PLATELET
Abs Immature Granulocytes: 0.02 10*3/uL (ref 0.00–0.07)
Basophils Absolute: 0 10*3/uL (ref 0.0–0.1)
Basophils Relative: 0 %
Eosinophils Absolute: 0.1 10*3/uL (ref 0.0–0.5)
Eosinophils Relative: 1 %
HCT: 41.6 % (ref 36.0–46.0)
Hemoglobin: 13.7 g/dL (ref 12.0–15.0)
Immature Granulocytes: 0 %
Lymphocytes Relative: 30 %
Lymphs Abs: 1.8 10*3/uL (ref 0.7–4.0)
MCH: 28.8 pg (ref 26.0–34.0)
MCHC: 32.9 g/dL (ref 30.0–36.0)
MCV: 87.4 fL (ref 80.0–100.0)
Monocytes Absolute: 0.4 10*3/uL (ref 0.1–1.0)
Monocytes Relative: 6 %
Neutro Abs: 3.6 10*3/uL (ref 1.7–7.7)
Neutrophils Relative %: 63 %
Platelets: 278 10*3/uL (ref 150–400)
RBC: 4.76 MIL/uL (ref 3.87–5.11)
RDW: 13.2 % (ref 11.5–15.5)
WBC: 5.9 10*3/uL (ref 4.0–10.5)
nRBC: 0 % (ref 0.0–0.2)

## 2022-12-07 LAB — BASIC METABOLIC PANEL
Anion gap: 9 (ref 5–15)
BUN: 17 mg/dL (ref 6–20)
CO2: 28 mmol/L (ref 22–32)
Calcium: 8.7 mg/dL — ABNORMAL LOW (ref 8.9–10.3)
Chloride: 101 mmol/L (ref 98–111)
Creatinine, Ser: 0.92 mg/dL (ref 0.44–1.00)
GFR, Estimated: >60 mL/min (ref >60)
Glucose, Bld: 111 mg/dL — ABNORMAL HIGH (ref 70–99)
Potassium: 3.9 mmol/L (ref 3.5–5.1)
Sodium: 138 mmol/L (ref 135–145)

## 2022-12-07 LAB — LIPID PANEL
Cholesterol: 203 mg/dL — ABNORMAL HIGH (ref 0–200)
HDL: 53 mg/dL (ref >40)
LDL Cholesterol: 129 mg/dL — ABNORMAL HIGH (ref 0–99)
Total CHOL/HDL Ratio: 3.8 {ratio}
Triglycerides: 106 mg/dL (ref <150)
VLDL: 21 mg/dL (ref 0–40)

## 2022-12-07 LAB — HEPATIC FUNCTION PANEL
ALT: 16 U/L (ref 0–44)
AST: 15 U/L (ref 15–41)
Albumin: 3.9 g/dL (ref 3.5–5.0)
Alkaline Phosphatase: 39 U/L (ref 38–126)
Bilirubin, Direct: <0.1 mg/dL (ref 0.0–0.2)
Total Bilirubin: 0.6 mg/dL (ref <1.2)
Total Protein: 6.7 g/dL (ref 6.5–8.1)

## 2022-12-07 LAB — VITAMIN D 25 HYDROXY (VIT D DEFICIENCY, FRACTURES): Vit D, 25-Hydroxy: 29.31 ng/mL — ABNORMAL LOW (ref 30–100)

## 2022-12-07 LAB — TSH: TSH: 2.424 u[IU]/mL (ref 0.350–4.500)

## 2022-12-07 LAB — HEMOGLOBIN A1C
Hgb A1c MFr Bld: 5.7 % — ABNORMAL HIGH (ref 4.8–5.6)
Mean Plasma Glucose: 116.89 mg/dL

## 2022-12-07 NOTE — Addendum Note (Signed)
Addended by: Lonell Face C on: 12/07/2022 07:15 AM   Modules accepted: Orders

## 2022-12-07 NOTE — Addendum Note (Signed)
Addended by: Lonell Face C on: 12/07/2022 07:14 AM   Modules accepted: Orders

## 2022-12-08 ENCOUNTER — Ambulatory Visit
Admission: RE | Admit: 2022-12-08 | Discharge: 2022-12-08 | Disposition: A | Discharge status: Home / Self Care | Payer: 59 | Source: Ambulatory Visit | Attending: Obstetrics | Admitting: Obstetrics

## 2022-12-08 DIAGNOSIS — N939 Abnormal uterine and vaginal bleeding, unspecified: Secondary | ICD-10-CM | POA: Diagnosis not present

## 2022-12-08 DIAGNOSIS — R1032 Left lower quadrant pain: Secondary | ICD-10-CM | POA: Diagnosis not present

## 2022-12-08 DIAGNOSIS — N888 Other specified noninflammatory disorders of cervix uteri: Secondary | ICD-10-CM | POA: Diagnosis not present

## 2022-12-08 DIAGNOSIS — G8929 Other chronic pain: Secondary | ICD-10-CM | POA: Insufficient documentation

## 2022-12-12 NOTE — Progress Notes (Unsigned)
Lacey Rodgers T. Sadi Arave, MD, CAQ Sports Medicine [facility] [address] [place], [address]  Phone: [phone]  FAX: [phone]  Lacey Rodgers - 42 y.o. female  MRN [id number]  Date of Birth: 03-Nov-1980  Date: 12/13/2022  PCP: Lacey Beat, MD  Referral: Lacey Beat, MD  No chief complaint on file.  Patient Care Team: Lacey Beat, MD as PCP - General Subjective:   Lacey Rodgers is a 42 y.o. pleasant patient who presents with the following:  Health Maintenance Summary Reviewed and updated, unless pt declines services.  Tobacco History Reviewed. Non-smoker Alcohol: No concerns, no excessive use Exercise Habits: Some activity, rec at least 30 mins 5 times a week STD concerns: none Drug Use: None Lumps or breast concerns: no  Flu Covid booster  Health Maintenance  Topic Date Due   COVID-19 Vaccine (3 - Moderna risk series) 05/29/2019   INFLUENZA VACCINE  08/03/2022   Cervical Cancer Screening (HPV/Pap Cotest)  03/02/2024   DTaP/Tdap/Td (3 - Td or Tdap) 10/14/2028   Hepatitis C Screening  Completed   HIV Screening  Completed   HPV VACCINES  Aged Out    Immunization History  Administered Date(s) Administered   Influenza,inj,Quad PF,6+ Mos 11/12/2013, 10/16/2017, 10/10/2019, 09/29/2021   MMR 12/19/2015   Moderna Sars-Covid-2 Vaccination 04/03/2019, 05/01/2019   Tdap 10/28/2015, 10/15/2018   Patient Active Problem List   Diagnosis Date Noted   Moderate recurrent major depression (HCC) 02/08/2017    Priority: Medium    Migraine without aura 10/23/2008    Priority: Medium    Essential hypertension 10/23/2008    Priority: Medium    BMI 45.0-49.9, adult (HCC) 05/22/2018    Priority: Low   GERD 10/23/2008    Priority: Low   HSV-2 (herpes simplex virus 2) infection 02/28/2021    Past Medical History:  Diagnosis Date   Depression    Genital herpes    GERD (gastroesophageal reflux disease)     History of kidney stones    currently   Hypertension    Migraine    Morbid obesity with BMI of 45.0-49.9, adult [facility])     Past Surgical History:  Procedure Laterality Date   CESAREAN SECTION N/A 12/16/2015   Procedure: CESAREAN SECTION;  Surgeon: Lacey Novak, MD;  Location: [facility];  Service: Obstetrics;  Laterality: N/A;   CESAREAN SECTION WITH BILATERAL TUBAL LIGATION  12/02/2018   Procedure: CESAREAN SECTION WITH BILATERAL TUBAL LIGATION;  Surgeon: Lacey Milch, MD;  Location: [facility];  Service: Obstetrics;;  TOB 1850 WEIGHT 7lb 5oz LENGTH 19.75 APGAR 9/9   DILATION AND CURETTAGE OF UTERUS  01/2013   ESOPHAGOGASTRODUODENOSCOPY N/A 02/17/2022   Procedure: ESOPHAGOGASTRODUODENOSCOPY (EGD);  Surgeon: Lacey Collins, DO;  Location: [facility];  Service: Gastroenterology;  Laterality: N/A;  DO NOT CALL!   INSERTION OF MESH  07/18/2021   Procedure: INSERTION OF MESH;  Surgeon: Lacey Mayotte, MD;  Location: [facility];  Service: General;;   VENTRAL HERNIA REPAIR N/A 07/18/2021   Procedure: HERNIA REPAIR VENTRAL ADULT;  Surgeon: Lacey Mayotte, MD;  Location: [facility];  Service: General;  Laterality: N/A;  Lacey Rodgers, RNFA to assist    Family History  Problem Relation Age of Onset   Hypertension Mother    Hypertension Father    Hypertension Brother    Cancer Brother 83   Hypertension Maternal Grandmother    Hypertension Maternal Grandfather    Hypertension Paternal Grandmother  Hypertension Paternal Grandfather     Social History   Social History Narrative   Not on file    Past Medical History, Surgical History, Social History, Family History, Problem List, Medications, and Allergies have been reviewed and updated if relevant.  Review of Systems: Pertinent positives are listed above.  Otherwise, a full 14 point review of systems has been done in full and it is negative except where it is noted positive.  Objective:   LMP 11/28/2022   Ideal Body Weight:   No results found.    10/10/2021    3:46 PM 07/23/2019    3:44 PM 03/08/2017    9:07 AM 02/08/2017    3:11 PM  Depression screen PHQ 2/9  Decreased Interest 0 2 2 2   Down, Depressed, Hopeless 0 2 2 3   PHQ - 2 Score 0 4 4 5   Altered sleeping  1 2 3   Tired, decreased energy  2 3 3   Change in appetite  1 2 3   Feeling bad or failure about yourself   1 1 2   Trouble concentrating  1 3 3   Moving slowly or fidgety/restless  0 0 2  Suicidal thoughts  0 0 0  PHQ-9 Score  10 15 21   Difficult doing work/chores  Somewhat difficult Somewhat difficult Very difficult     GEN: well developed, well nourished, no acute distress Eyes: conjunctiva and lids normal, PERRLA, EOMI ENT: TM clear, nares clear, oral exam WNL Neck: supple, no lymphadenopathy, no thyromegaly, no JVD Pulm: clear to auscultation and percussion, respiratory effort normal CV: regular rate and rhythm, S1-S2, no murmur, rub or gallop, no bruits Chest: no scars, masses, no lumps BREAST: breast exam declined GI: soft, non-tender; no hepatosplenomegaly, masses; active bowel sounds all quadrants GU: GU exam declined Lymph: no cervical, axillary or inguinal adenopathy MSK: gait normal, muscle tone and strength WNL, no joint swelling, effusions, discoloration, crepitus  SKIN: clear, good turgor, color WNL, no rashes, lesions, or ulcerations Neuro: normal mental status, normal strength, sensation, and motion Psych: alert; oriented to person, place and time, normally interactive and not anxious or depressed in appearance.   All labs reviewed with patient. Results for orders placed or performed during the hospital encounter of 12/07/22  Basic metabolic panel  Result Value Ref Range   Sodium 138 135 - 145 mmol/L   Potassium 3.9 3.5 - 5.1 mmol/L   Chloride 101 98 - 111 mmol/L   CO2 28 22 - 32 mmol/L   Glucose, Bld 111 (H) 70 - 99 mg/dL   BUN 17 6 - 20 mg/dL   Creatinine, Ser 4.40 0.44 - 1.00 mg/dL   Calcium 8.7  (L) 8.9 - 10.3 mg/dL   GFR, Estimated >10 >27 mL/min   Anion gap 9 5 - 15  CBC with Differential/Platelet  Result Value Ref Range   WBC 5.9 4.0 - 10.5 K/uL   RBC 4.76 3.87 - 5.11 MIL/uL   Hemoglobin 13.7 12.0 - 15.0 g/dL   HCT 25.3 66.4 - 40.3 %   MCV 87.4 80.0 - 100.0 fL   MCH 28.8 26.0 - 34.0 pg   MCHC 32.9 30.0 - 36.0 g/dL   RDW 47.4 25.9 - 56.3 %   Platelets 278 150 - 400 K/uL   nRBC 0.0 0.0 - 0.2 %   Neutrophils Relative % 63 %   Neutro Abs 3.6 1.7 - 7.7 K/uL   Lymphocytes Relative 30 %   Lymphs Abs 1.8 0.7 - 4.0 K/uL   Monocytes  Relative 6 %   Monocytes Absolute 0.4 0.1 - 1.0 K/uL   Eosinophils Relative 1 %   Eosinophils Absolute 0.1 0.0 - 0.5 K/uL   Basophils Relative 0 %   Basophils Absolute 0.0 0.0 - 0.1 K/uL   Immature Granulocytes 0 %   Abs Immature Granulocytes 0.02 0.00 - 0.07 K/uL  Hepatic function panel  Result Value Ref Range   Total Protein 6.7 6.5 - 8.1 g/dL   Albumin 3.9 3.5 - 5.0 g/dL   AST 15 15 - 41 U/L   ALT 16 0 - 44 U/L   Alkaline Phosphatase 39 38 - 126 U/L   Total Bilirubin 0.6 <1.2 mg/dL   Bilirubin, Direct <9.1 0.0 - 0.2 mg/dL   Indirect Bilirubin NOT CALCULATED 0.3 - 0.9 mg/dL  Hemoglobin Y7W  Result Value Ref Range   Hgb A1c MFr Bld 5.7 (H) 4.8 - 5.6 %   Mean Plasma Glucose 116.89 mg/dL  Lipid panel  Result Value Ref Range   Cholesterol 203 (H) 0 - 200 mg/dL   Triglycerides 295 <621 mg/dL   HDL 53 >30 mg/dL   Total CHOL/HDL Ratio 3.8 RATIO   VLDL 21 0 - 40 mg/dL   LDL Cholesterol 865 (H) 0 - 99 mg/dL  TSH  Result Value Ref Range   TSH 2.424 0.350 - 4.500 uIU/mL  VITAMIN D 25 Hydroxy (Vit-D Deficiency, Fractures)  Result Value Ref Range   Vit D, 25-Hydroxy 29.31 (L) 30 - 100 ng/mL   US PELVIC COMPLETE WITH TRANSVAGINAL  Result Date: 12/10/2022 : PROCEDURE: US PELVIS COMPLETE WITH TRANSVAGINAL HISTORY: Patient is a 42 y/o F with LLQ pelvic pain, AUB. LMP 11/13/2022. C-section x 2. COMPARISON: CT A/P 07/08/2021. TECHNIQUE:  Two-dimensional transabdominal grayscale and color Doppler ultrasound of the pelvis was performed. Transvaginal was performed. FINDINGS: The uterus is anteverted in position and measures 10.4 x 5.2 x 5.4 cm. It demonstrates a heterogeneous echotexture without definite fibroid. There is a C-section scar visualized within the anterior lower uterine segment. The endometrium measures 1.3 cm and demonstrates a normal homogeneous echotexture. Nabothian cysts are visualized within the cervix. The right ovary is not visualized. The left ovary measures 3.3 x 2.1 x 2.4 cm and demonstrates a normal echotexture with a 2.1 cm anechoic dominant follicle. There is normal color Doppler flow. There is no fluid present within the cul-de-sac. IMPRESSION: 1. Evidence of prior cesarean childbearing. Mildly heterogeneous appearance of the uterus, otherwise unremarkable. Nonvisualization of the right ovary, presumably related to overlying bowel contents Thank you for allowing [place] to assist in the care of this patient. Electronically Signed   By: Lestine Box M.D.   On: 12/10/2022 09:57    Assessment and Plan:     ICD-10-CM   1. Healthcare maintenance  Z00.00       Health Maintenance Exam: The patient's preventative maintenance and recommended screening tests for an annual wellness exam were reviewed in full today. Brought up to date unless services declined.  Counselled on the importance of diet, exercise, and its role in overall health and mortality. The patient's FH and SH was reviewed, including their home life, tobacco status, and drug and alcohol status.  Follow-up in 1 year for physical exam or additional follow-up below.  Disposition: No follow-ups on file.  Future Appointments  Date Time Provider Department [facility]  12/13/2022  4:00 PM Sophiamarie Nease, Karleen Hampshire, MD [facility]    No orders of the defined types were placed in this encounter.  There are no  discontinued medications. No orders of the defined types  were placed in this encounter.   Signed,  Elpidio Galea. Rukaya Kleinschmidt, MD   Allergies as of 12/13/2022       Reactions   Pseudoeph-doxylamine-dm-apap Hives        Medication List        Accurate as of December 12, 2022  6:17 PM. If you have any questions, ask your nurse or doctor.          amLODipine 10 MG tablet Commonly known as: NORVASC Take 1 tablet (10 mg total) by mouth daily.   buPROPion 150 MG 12 hr tablet Commonly known as: WELLBUTRIN SR Take 1 tablet (150 mg total) by mouth 2 (two) times daily.   escitalopram 20 MG tablet Commonly known as: LEXAPRO Take 1 tablet (20 mg total) by mouth daily.   esomeprazole 40 MG capsule Commonly known as: NEXIUM Take 1 capsule (40 mg total) by mouth 2 (two) times daily 30 minutes before breakfast and supper   gabapentin 100 MG capsule Commonly known as: Neurontin Follow titration until 3 capsules three times a day is reached. What changed:  how much to take when to take this reasons to take this additional instructions   hydrochlorothiazide 25 MG tablet Commonly known as: HYDRODIURIL Take 1 tablet (25 mg total) by mouth daily.   losartan 25 MG tablet Commonly known as: COZAAR Take 1 tablet (25 mg total) by mouth daily.   multivitamin tablet Take 1 tablet by mouth daily.   Nasacort Allergy 24HR 55 MCG/ACT Aero nasal inhaler Generic drug: triamcinolone Place into the nose.   valACYclovir 1000 MG tablet Commonly known as: VALTREX Take 1 tablet (1,000 mg total) by mouth at bedtime.

## 2022-12-13 ENCOUNTER — Other Ambulatory Visit: Payer: Self-pay

## 2022-12-13 ENCOUNTER — Ambulatory Visit (INDEPENDENT_AMBULATORY_CARE_PROVIDER_SITE_OTHER): Payer: 59 | Admitting: Family Medicine

## 2022-12-13 ENCOUNTER — Encounter: Payer: Self-pay | Admitting: *Deleted

## 2022-12-13 VITALS — BP 140/90 | HR 91 | Temp 97.3°F | Ht 58.5 in | Wt 239.1 lb

## 2022-12-13 DIAGNOSIS — Z0000 Encounter for general adult medical examination without abnormal findings: Secondary | ICD-10-CM

## 2022-12-13 DIAGNOSIS — F32A Depression, unspecified: Secondary | ICD-10-CM | POA: Diagnosis not present

## 2022-12-13 DIAGNOSIS — F419 Anxiety disorder, unspecified: Secondary | ICD-10-CM | POA: Diagnosis not present

## 2022-12-13 DIAGNOSIS — R1032 Left lower quadrant pain: Secondary | ICD-10-CM

## 2022-12-13 MED ORDER — ESCITALOPRAM OXALATE 20 MG PO TABS
20.0000 mg | ORAL_TABLET | Freq: Every day | ORAL | 3 refills | Status: DC
  Filled 2022-12-13 – 2023-03-09 (×2): qty 90, 90d supply, fill #0
  Filled 2023-06-19: qty 90, 90d supply, fill #1
  Filled 2023-10-02: qty 90, 90d supply, fill #2

## 2022-12-13 MED ORDER — HYDROCHLOROTHIAZIDE 25 MG PO TABS
25.0000 mg | ORAL_TABLET | Freq: Every day | ORAL | 3 refills | Status: DC
  Filled 2022-12-13 – 2023-02-02 (×2): qty 90, 90d supply, fill #0
  Filled 2023-05-26: qty 90, 90d supply, fill #1
  Filled 2023-08-23: qty 90, 90d supply, fill #2
  Filled 2023-12-05: qty 90, 90d supply, fill #3

## 2022-12-13 MED ORDER — LOSARTAN POTASSIUM 25 MG PO TABS
25.0000 mg | ORAL_TABLET | Freq: Every day | ORAL | 3 refills | Status: DC
  Filled 2022-12-13 – 2023-01-01 (×2): qty 90, 90d supply, fill #0
  Filled 2023-03-09: qty 90, 90d supply, fill #1
  Filled 2023-06-19: qty 90, 90d supply, fill #2
  Filled 2023-10-02: qty 90, 90d supply, fill #3

## 2022-12-13 MED ORDER — HYDROCORTISONE 2.5 % EX OINT
1.0000 | TOPICAL_OINTMENT | Freq: Two times a day (BID) | CUTANEOUS | 0 refills | Status: DC
  Filled 2022-12-13: qty 28.35, 30d supply, fill #0

## 2022-12-13 MED ORDER — BUPROPION HCL ER (SR) 150 MG PO TB12
150.0000 mg | ORAL_TABLET | Freq: Two times a day (BID) | ORAL | 3 refills | Status: AC
  Filled 2022-12-13: qty 180, 90d supply, fill #0
  Filled 2023-03-15: qty 180, 90d supply, fill #1
  Filled 2023-06-19: qty 180, 90d supply, fill #2
  Filled 2023-09-19: qty 180, 90d supply, fill #3

## 2022-12-13 MED ORDER — TIRZEPATIDE-WEIGHT MANAGEMENT 2.5 MG/0.5ML ~~LOC~~ SOLN
2.5000 mg | SUBCUTANEOUS | 3 refills | Status: DC
  Filled 2022-12-13: qty 2, 28d supply, fill #0

## 2022-12-14 ENCOUNTER — Other Ambulatory Visit: Payer: Self-pay

## 2022-12-22 ENCOUNTER — Ambulatory Visit
Admission: RE | Admit: 2022-12-22 | Discharge: 2022-12-22 | Disposition: A | Discharge status: Home / Self Care | Payer: 59 | Source: Ambulatory Visit | Attending: Family Medicine | Admitting: Family Medicine

## 2022-12-22 DIAGNOSIS — R1032 Left lower quadrant pain: Secondary | ICD-10-CM | POA: Insufficient documentation

## 2022-12-22 DIAGNOSIS — N200 Calculus of kidney: Secondary | ICD-10-CM | POA: Diagnosis not present

## 2022-12-22 DIAGNOSIS — N83202 Unspecified ovarian cyst, left side: Secondary | ICD-10-CM | POA: Diagnosis not present

## 2022-12-22 MED ORDER — IOHEXOL 300 MG/ML  SOLN
100.0000 mL | Freq: Once | INTRAMUSCULAR | Status: AC | PRN
  Administered 2022-12-22: 100 mL via INTRAVENOUS

## 2022-12-28 ENCOUNTER — Other Ambulatory Visit (HOSPITAL_COMMUNITY): Payer: Self-pay

## 2022-12-28 ENCOUNTER — Other Ambulatory Visit: Payer: Self-pay

## 2022-12-28 ENCOUNTER — Encounter: Payer: Self-pay | Admitting: Family Medicine

## 2022-12-28 ENCOUNTER — Other Ambulatory Visit: Payer: Self-pay | Admitting: Family Medicine

## 2022-12-28 MED ORDER — ZEPBOUND 2.5 MG/0.5ML ~~LOC~~ SOAJ
2.5000 mg | SUBCUTANEOUS | 3 refills | Status: DC
  Filled 2022-12-28: qty 2, 28d supply, fill #0

## 2023-01-01 ENCOUNTER — Other Ambulatory Visit: Payer: Self-pay

## 2023-01-02 ENCOUNTER — Other Ambulatory Visit: Payer: Self-pay

## 2023-01-15 ENCOUNTER — Encounter: Payer: Self-pay | Admitting: Family Medicine

## 2023-02-02 ENCOUNTER — Other Ambulatory Visit: Payer: Self-pay

## 2023-03-09 ENCOUNTER — Other Ambulatory Visit: Payer: Self-pay

## 2023-03-14 ENCOUNTER — Other Ambulatory Visit: Payer: Self-pay

## 2023-03-15 ENCOUNTER — Other Ambulatory Visit: Payer: Self-pay

## 2023-04-02 ENCOUNTER — Encounter: Payer: Self-pay | Admitting: Family Medicine

## 2023-04-02 ENCOUNTER — Ambulatory Visit: Payer: Self-pay | Admitting: *Deleted

## 2023-04-02 NOTE — Telephone Encounter (Signed)
 Copied from CRM [id number]. Topic: Clinical - Red Word Triage >> Apr 02, 2023 12:21 PM Florestine Avers wrote: Red Word that prompted transfer to Nurse Triage: Patient called in stating that she is having pain on her side, radiating to her back. extreme pain within the last 24 hours.  Did not need to triage pt.   She sent a MyChart message to the practice and they told her to call in and make an appt. Per message dated 04/02/2023 at 9:28 AM from pt then message from CMA dated 04/02/2023 at 10:08 AM that pt read and was calling in for appt.

## 2023-04-03 NOTE — Progress Notes (Unsigned)
     Lacey Rodgers T. Lacey Mcreynolds, MD, CAQ Sports Medicine [facility] [address] [place], [address]  Phone: [phone]  FAX: [phone]  Lacey Rodgers - 43 y.o. female  MRN [id number]  Date of Birth: 1980-09-30  Date: 04/04/2023  PCP: Lacey Beat, MD  Referral: Lacey Beat, MD  No chief complaint on file.  Subjective:   Lacey Rodgers is a 43 y.o. very pleasant female patient with There is no height or weight on file to calculate BMI. who presents with the following:  She is a very pleasant patient who is having some abdominal and flank pain.    Review of Systems is noted in the HPI, as appropriate  Objective:   There were no vitals taken for this visit.  GEN: No acute distress; alert,appropriate. PULM: Breathing comfortably in no respiratory distress PSYCH: Normally interactive.   Laboratory and Imaging Data:  Assessment and Plan:   ***

## 2023-04-04 ENCOUNTER — Ambulatory Visit: Admitting: Family Medicine

## 2023-04-04 VITALS — BP 120/82 | HR 98 | Temp 97.4°F | Ht 58.5 in | Wt 257.0 lb

## 2023-04-04 DIAGNOSIS — R109 Unspecified abdominal pain: Secondary | ICD-10-CM | POA: Diagnosis not present

## 2023-04-04 LAB — POC URINALSYSI DIPSTICK (AUTOMATED)
Bilirubin, UA: NEGATIVE
Blood, UA: NEGATIVE
Glucose, UA: NEGATIVE
Ketones, UA: NEGATIVE
Leukocytes, UA: NEGATIVE
Nitrite, UA: NEGATIVE
Protein, UA: NEGATIVE
Spec Grav, UA: 1.015 (ref 1.010–1.025)
Urobilinogen, UA: 0.2 U/dL
pH, UA: 6 (ref 5.0–8.0)

## 2023-04-04 NOTE — Patient Instructions (Signed)
 Alleve 2 tabs by mouth two times a day over the counter: Take at least for 1 weeks. This is equal to a prescripton strength dose (GENERIC CHEAPER EQUIVALENT IS NAPROXEN SODIUM)

## 2023-04-05 ENCOUNTER — Encounter: Payer: Self-pay | Admitting: Family Medicine

## 2023-05-26 ENCOUNTER — Other Ambulatory Visit: Payer: Self-pay

## 2023-05-27 ENCOUNTER — Other Ambulatory Visit: Payer: Self-pay

## 2023-05-29 ENCOUNTER — Other Ambulatory Visit: Payer: Self-pay

## 2023-05-30 ENCOUNTER — Other Ambulatory Visit: Payer: Self-pay

## 2023-06-01 ENCOUNTER — Encounter: Payer: Self-pay | Admitting: Family Medicine

## 2023-06-01 ENCOUNTER — Other Ambulatory Visit: Payer: Self-pay

## 2023-06-01 MED ORDER — AMOXICILLIN-POT CLAVULANATE 875-125 MG PO TABS
1.0000 | ORAL_TABLET | Freq: Two times a day (BID) | ORAL | 0 refills | Status: DC
  Filled 2023-06-01: qty 20, 10d supply, fill #0

## 2023-06-19 ENCOUNTER — Other Ambulatory Visit: Payer: Self-pay

## 2023-06-20 ENCOUNTER — Other Ambulatory Visit: Payer: Self-pay

## 2023-06-20 MED ORDER — ESOMEPRAZOLE MAGNESIUM 40 MG PO CPDR
40.0000 mg | DELAYED_RELEASE_CAPSULE | Freq: Two times a day (BID) | ORAL | 3 refills | Status: DC
  Filled 2023-06-20: qty 180, 90d supply, fill #0
  Filled 2023-09-19: qty 180, 90d supply, fill #1

## 2023-07-16 ENCOUNTER — Ambulatory Visit
Admission: RE | Admit: 2023-07-16 | Discharge: 2023-07-16 | Disposition: A | Discharge status: Home / Self Care | Attending: Physician Assistant | Admitting: Physician Assistant

## 2023-07-16 ENCOUNTER — Ambulatory Visit
Admission: RE | Admit: 2023-07-16 | Discharge: 2023-07-16 | Disposition: A | Discharge status: Home / Self Care | Source: Ambulatory Visit | Attending: Physician Assistant | Admitting: Physician Assistant

## 2023-07-16 ENCOUNTER — Other Ambulatory Visit: Payer: Self-pay | Admitting: Physician Assistant

## 2023-07-16 ENCOUNTER — Other Ambulatory Visit: Payer: Self-pay

## 2023-07-16 DIAGNOSIS — W19XXXA Unspecified fall, initial encounter: Secondary | ICD-10-CM | POA: Insufficient documentation

## 2023-07-16 DIAGNOSIS — Y990 Civilian activity done for income or pay: Secondary | ICD-10-CM | POA: Insufficient documentation

## 2023-07-16 DIAGNOSIS — M47816 Spondylosis without myelopathy or radiculopathy, lumbar region: Secondary | ICD-10-CM | POA: Insufficient documentation

## 2023-07-16 DIAGNOSIS — Z043 Encounter for examination and observation following other accident: Secondary | ICD-10-CM | POA: Diagnosis not present

## 2023-07-16 MED ORDER — METHOCARBAMOL 500 MG PO TABS
500.0000 mg | ORAL_TABLET | Freq: Every evening | ORAL | 0 refills | Status: AC
  Filled 2023-07-16: qty 15, 15d supply, fill #0

## 2023-08-23 ENCOUNTER — Encounter: Payer: Self-pay | Admitting: Family Medicine

## 2023-08-23 ENCOUNTER — Other Ambulatory Visit: Payer: Self-pay

## 2023-08-23 NOTE — Telephone Encounter (Signed)
 lvm for pt to call office to schedule appt.

## 2023-08-23 NOTE — Telephone Encounter (Signed)
 Please call and schedule CPE with Dr. Watt next week.

## 2023-08-24 NOTE — Telephone Encounter (Signed)
 Pt set up her appt mychart

## 2023-08-29 ENCOUNTER — Encounter: Admitting: Family Medicine

## 2023-09-04 ENCOUNTER — Encounter: Payer: Self-pay | Admitting: Family Medicine

## 2023-10-04 ENCOUNTER — Ambulatory Visit: Payer: Self-pay

## 2023-10-04 NOTE — Telephone Encounter (Signed)
Noted.  Will see at OV.  

## 2023-10-04 NOTE — Telephone Encounter (Signed)
 I appreciate Dr. Cleatus seeing the patient.

## 2023-10-04 NOTE — Telephone Encounter (Signed)
 FYI Only or Action Required?: FYI only for provider.  Patient was last seen in primary care on 04/04/2023 by Lacey Mirza, MD.  Called Nurse Triage reporting Abdominal Pain.  Symptoms began several months ago.  Interventions attempted: Nothing.  Symptoms are: unchanged.  Triage Disposition: See HCP Within 24 Hours (Or PCP Triage)  Patient/caregiver understands and will follow disposition?: Yes     Copied from CRM [id number]. Topic: Clinical - Red Word Triage >> Oct 04, 2023 12:29 PM [name] wrote: Red Word that prompted transfer to Nurse Triage: Patient is experiencing severe pain on the left side of the abdomen. Reason for Disposition  [1] MILD-MODERATE pain AND [2] constant AND [3] present > 2 hours  Answer Assessment - Initial Assessment Questions 1. LOCATION: Where does it hurt?      Left side of abdomen going towards middle of abd 2. RADIATION: Does the pain shoot anywhere else? (e.g., chest, back)     denies 3. ONSET: When did the pain begin? (e.g., minutes, hours or days ago)      3 months 4. SUDDEN: Gradual or sudden onset?     gradually 5. PATTERN Does the pain come and go, or is it constant?     Used to come and go and now constant 6. SEVERITY: How bad is the pain?  (e.g., Scale 1-10; mild, moderate, or severe)     5-6/10 7. RECURRENT SYMPTOM: Have you ever had this type of stomach pain before? If Yes, ask: When was the last time? and What happened that time?      Yes, last year 8. CAUSE: What do you think is causing the stomach pain? (e.g., gallstones, recent abdominal surgery)     no 9. RELIEVING/AGGRAVATING FACTORS: What makes it better or worse? (e.g., antacids, bending or twisting motion, bowel movement)     no 10. OTHER SYMPTOMS: Do you have any other symptoms? (e.g., back pain, diarrhea, fever, urination pain, vomiting)       no 11. PREGNANCY: Is there any chance you are pregnant? When was your last menstrual period?        na  Protocols used: Abdominal Pain - Female-A-AH

## 2023-10-05 ENCOUNTER — Encounter: Payer: Self-pay | Admitting: Family Medicine

## 2023-10-05 ENCOUNTER — Other Ambulatory Visit: Payer: Self-pay

## 2023-10-05 ENCOUNTER — Telehealth: Payer: Self-pay

## 2023-10-05 ENCOUNTER — Ambulatory Visit: Admitting: Family Medicine

## 2023-10-05 VITALS — BP 126/74 | HR 82 | Temp 98.4°F | Ht 58.5 in | Wt 243.8 lb

## 2023-10-05 DIAGNOSIS — R109 Unspecified abdominal pain: Secondary | ICD-10-CM | POA: Diagnosis not present

## 2023-10-05 DIAGNOSIS — B009 Herpesviral infection, unspecified: Secondary | ICD-10-CM | POA: Diagnosis not present

## 2023-10-05 LAB — COMPREHENSIVE METABOLIC PANEL WITH GFR
ALT: 16 U/L (ref 0–35)
AST: 16 U/L (ref 0–37)
Albumin: 4.5 g/dL (ref 3.5–5.2)
Alkaline Phosphatase: 48 U/L (ref 39–117)
BUN: 14 mg/dL (ref 6–23)
CO2: 30 meq/L (ref 19–32)
Calcium: 9.5 mg/dL (ref 8.4–10.5)
Chloride: 100 meq/L (ref 96–112)
Creatinine, Ser: 0.93 mg/dL (ref 0.40–1.20)
GFR: 75.74 mL/min (ref >60.00)
Glucose, Bld: 99 mg/dL (ref 70–99)
Potassium: 3.6 meq/L (ref 3.5–5.1)
Sodium: 138 meq/L (ref 135–145)
Total Bilirubin: 0.5 mg/dL (ref 0.2–1.2)
Total Protein: 7.5 g/dL (ref 6.0–8.3)

## 2023-10-05 LAB — CBC WITH DIFFERENTIAL/PLATELET
Basophils Absolute: 0 K/uL (ref 0.0–0.1)
Basophils Relative: 0.6 % (ref 0.0–3.0)
Eosinophils Absolute: 0.1 K/uL (ref 0.0–0.7)
Eosinophils Relative: 1.1 % (ref 0.0–5.0)
HCT: 43.6 % (ref 36.0–46.0)
Hemoglobin: 14.5 g/dL (ref 12.0–15.0)
Lymphocytes Relative: 29.3 % (ref 12.0–46.0)
Lymphs Abs: 1.7 K/uL (ref 0.7–4.0)
MCHC: 33.2 g/dL (ref 30.0–36.0)
MCV: 86.6 fl (ref 78.0–100.0)
Monocytes Absolute: 0.4 K/uL (ref 0.1–1.0)
Monocytes Relative: 6.6 % (ref 3.0–12.0)
Neutro Abs: 3.6 K/uL (ref 1.4–7.7)
Neutrophils Relative %: 62.4 % (ref 43.0–77.0)
Platelets: 332 K/uL (ref 150.0–400.0)
RBC: 5.03 Mil/uL (ref 3.87–5.11)
RDW: 13 % (ref 11.5–15.5)
WBC: 5.8 K/uL (ref 4.0–10.5)

## 2023-10-05 LAB — LIPASE: Lipase: 13 U/L (ref 11.0–59.0)

## 2023-10-05 MED ORDER — SUCRALFATE 1 G PO TABS
1.0000 g | ORAL_TABLET | Freq: Three times a day (TID) | ORAL | 0 refills | Status: AC
  Filled 2023-10-05: qty 40, 10d supply, fill #0

## 2023-10-05 MED ORDER — VALACYCLOVIR HCL 1 G PO TABS: 1000.0000 mg | ORAL_TABLET | Freq: Every evening | ORAL | Status: AC | PRN

## 2023-10-05 NOTE — Telephone Encounter (Signed)
 Returned call to [facility] and provided information that is needed

## 2023-10-05 NOTE — Telephone Encounter (Signed)
 Copied from CRM [id number]. Topic: General - Other >> Oct 05, 2023  9:11 AM Mercedes MATSU wrote: Reason for CRM: [facility] called in wanting to know what other imaging is needed for abdominal scan. Requesting urgent call back and can be reached at [phone] opt 1, opt 5.

## 2023-10-05 NOTE — Telephone Encounter (Signed)
 She needs abd u/s for L sided abdominal pain.

## 2023-10-05 NOTE — Progress Notes (Signed)
 H/o scoliosis.  L sided abdominal pain and L sided back pain.  Back pain going on for months to years.  Worse sx recently, over the last few months.  Increase in pain with sitting, eating. Not sleeping well from pain.  No R sided pain.  No vomiting.  On PPI.  Some nausea in the midst of flare.  Pain is constant but variable severity now.  No blood in stools.  No black stools.  She works at [employer].  [place] in July.  Slipped and feet went out from under her.  Landed on her back.  No blood in urine and no dysuria.   Xray at the time:  IMPRESSION: 1. No acute osseous abnormality of the lumbar spine. 2. Moderate to severe levoscoliosis of the lumbar spine. 3. Mild-to-moderate multilevel degenerative changes.  H/o BTL.  Taken ibuprofen  and tylenol  w/o relief.    Prev EGD with  A. STOMACH; COLD BIOPSY:  - GASTRIC ANTRAL MUCOSA WITH REACTIVE GASTROPATHY.  - GASTRIC OXYNTIC MUCOSA WITH CHANGES OF PPI EFFECT.  - NEGATIVE FOR H. PYLORI, DYSPLASIA, AND MALIGNANCY.   B. ESOPHAGUS, DISTAL; COLD BIOPSY:  - BENIGN SQUAMOUS MUCOSA WITH MILD ACANTHOSIS.  - NO INCREASE IN INTRAEPITHELIAL EOSINOPHILS (LESS THAN 2 PER HPF).  - NEGATIVE FOR DYSPLASIA AND MALIGNANCY.   C. ESOPHAGUS, PROXIMAL; COLD BIOPSY:  - BENIGN SQUAMOUS MUCOSA WITH MILD ACANTHOSIS.  - NO INCREASE IN INTRAEPITHELIAL EOSINOPHILS (LESS THAN 2 PER HPF).  - NEGATIVE FOR DYSPLASIA AND MALIGNANCY.   Prev abd CT d/w pt.    1. No acute intra-abdominal or pelvic pathology. 2. Small nonobstructing bilateral renal calculi. No hydronephrosis or obstructing stone. 3. Moderate colonic stool burden. No bowel obstruction. Normal appendix.  Robaxin  didn't help her back pain. Prev did PT for her back.  She is still doing her stretching at baseline.     Meds, vitals, and allergies reviewed.   ROS: Per HPI unless specifically indicated in ROS section   Nad Ncat Neck supple, no LA Rrr Ctab Abd soft, not ttp L side of abd at midaxillary line sore  at baseline but not to the touch, same with L lower back.  No BLE edema.

## 2023-10-05 NOTE — Patient Instructions (Addendum)
 Go to the lab on the way out.   If you have mychart we'll likely use that to update you.    Let me know if you can't get scheduled for the ultrasound.  Try carafate in the meantime.  Try an OTC lidocaine  patch on your back and see if that helps.  Take care.  Glad to see you.

## 2023-10-07 ENCOUNTER — Ambulatory Visit: Payer: Self-pay | Admitting: Family Medicine

## 2023-10-07 DIAGNOSIS — R109 Unspecified abdominal pain: Secondary | ICD-10-CM | POA: Insufficient documentation

## 2023-10-07 NOTE — Assessment & Plan Note (Signed)
 Unclear how much her abdominal pain is related to her back or if it is a separate issue.  See notes on labs re: abd pain.  She can let me know if she can't get scheduled for the abdominal ultrasound.  Reasonable to try carafate in the meantime.  If she improves with that she would have relief and we would have extra information.  Discussed.  Discussed concurrently treating her back pain to see if that would make any difference. Reasonable to try an OTC lidocaine  patch on her back and see if that helps.  It may be reasonable to try gabapentin  in the future, depending on her clinical response.  Note routed to PCP as FYI.

## 2023-10-16 ENCOUNTER — Ambulatory Visit
Admission: RE | Admit: 2023-10-16 | Discharge: 2023-10-16 | Disposition: A | Discharge status: Home / Self Care | Source: Ambulatory Visit | Attending: Family Medicine | Admitting: Family Medicine

## 2023-10-16 DIAGNOSIS — K8020 Calculus of gallbladder without cholecystitis without obstruction: Secondary | ICD-10-CM | POA: Diagnosis not present

## 2023-10-16 DIAGNOSIS — R109 Unspecified abdominal pain: Secondary | ICD-10-CM

## 2023-10-17 ENCOUNTER — Encounter: Payer: Self-pay | Admitting: Family Medicine

## 2023-10-20 NOTE — Telephone Encounter (Signed)
 Pt. Sent message to Dr. Cleatus.  Since I haven't seen her in 6 months, and Dr. Cleatus has been addressing this issue, likely more appropriate for him to address her additional questions from that visit.

## 2023-10-21 NOTE — Telephone Encounter (Signed)
 I need your input.  My concern is that her symptoms are coming from her back.  We could have her try gabapentin  and then follow-up with you, or we can get her back into your clinic to talk about options other than gabapentin  in the meantime.  I think either is reasonable.  Please let me know about your preference.  Thanks.

## 2023-12-06 ENCOUNTER — Encounter: Payer: Self-pay | Admitting: General Surgery

## 2023-12-16 ENCOUNTER — Encounter: Payer: Self-pay | Admitting: Family Medicine

## 2023-12-16 NOTE — Progress Notes (Unsigned)
 Magnus Crescenzo T. Kyren Vaux, MD, CAQ Sports Medicine [facility] [address] [place], [address]  Phone: [phone]  FAX: [phone]  Lacey Rodgers - 43 y.o. female  MRN [id number]  Date of Birth: 28-Dec-1980  Date: 12/17/2023  PCP: Watt Mirza, MD  Referral: Watt Mirza, MD  No chief complaint on file.  Patient Care Team: Watt Mirza, MD as PCP - General Subjective:   Lacey Rodgers is a 43 y.o. pleasant patient who presents with the following:  Discussed the use of AI scribe software for clinical note transcription with the patient, who gave verbal consent to proceed.  History of Present Illness     Health Maintenance Summary Reviewed and updated, unless pt declines services.  Tobacco History Reviewed. Non-smoker Alcohol: No concerns, no excessive use Exercise Habits: Some activity, rec at least 30 mins 5 times a week STD concerns: none Drug Use: None Lumps or breast concerns: no  Flu shot? Mammogram   Health Maintenance  Topic Date Due   Hepatitis B Vaccines 19-59 Average Risk (1 of 3 - 19+ 3-dose series) Never done   HPV VACCINES (1 - 3-dose SCDM series) Never done   COVID-19 Vaccine (3 - Moderna risk series) 05/29/2019   Influenza Vaccine  04/01/2024 (Originally 08/03/2023)   Cervical Cancer Screening (HPV/Pap Cotest)  03/02/2024   Mammogram  10/18/2024   DTaP/Tdap/Td (3 - Td or Tdap) 10/14/2028   Hepatitis C Screening  Completed   HIV Screening  Completed   Pneumococcal Vaccine  Aged Out   Meningococcal B Vaccine  Aged Out    Immunization History  Administered Date(s) Administered   Influenza, Seasonal, Injecte, Preservative Fre 10/25/2022   Influenza,inj,Quad PF,6+ Mos 11/12/2013, 10/16/2017, 10/10/2019, 09/29/2021   MMR 12/19/2015   Moderna Sars-Covid-2 Vaccination 04/03/2019, 05/01/2019   Tdap 10/28/2015, 10/15/2018   Patient Active Problem List   Diagnosis Date Noted   Moderate  recurrent major depression (HCC) 02/08/2017    Priority: Medium    Migraine without aura 10/23/2008    Priority: Medium    Essential hypertension 10/23/2008    Priority: Medium    BMI 45.0-49.9, adult (HCC) 05/22/2018    Priority: Low   GERD 10/23/2008    Priority: Low   Abdominal pain 10/07/2023   HSV-2 (herpes simplex virus 2) infection 02/28/2021    Past Medical History:  Diagnosis Date   Allergy    Anxiety    Depression    Genital herpes    GERD (gastroesophageal reflux disease)    History of kidney stones    Hypertension    Migraine    Morbid obesity with BMI of 45.0-49.9, adult [facility])     Past Surgical History:  Procedure Laterality Date   CESAREAN SECTION N/A 12/16/2015   Procedure: CESAREAN SECTION;  Surgeon: Garnette JONETTA Mace, MD;  Location: [facility] ORS;  Service: Obstetrics;  Laterality: N/A;   CESAREAN SECTION WITH BILATERAL TUBAL LIGATION  12/02/2018   Procedure: CESAREAN SECTION WITH BILATERAL TUBAL LIGATION;  Surgeon: Victor Claudell SAUNDERS, MD;  Location: [facility] ORS;  Service: Obstetrics;;  TOB 1850 WEIGHT 7lb 5oz LENGTH 19.75 APGAR 9/9   DILATION AND CURETTAGE OF UTERUS  01/2013   ESOPHAGOGASTRODUODENOSCOPY N/A 02/17/2022   Procedure: ESOPHAGOGASTRODUODENOSCOPY (EGD);  Surgeon: Onita Elspeth Sharper, DO;  Location: [facility] ENDOSCOPY;  Service: Gastroenterology;  Laterality: N/A;  DO NOT CALL!   HERNIA REPAIR  07/2021   INSERTION OF MESH  07/18/2021   Procedure: INSERTION OF MESH;  Surgeon:  Dessa Reyes ORN, MD;  Location: [facility] ORS;  Service: General;;   TUBAL LIGATION  12/02/2018   VENTRAL HERNIA REPAIR N/A 07/18/2021   Procedure: HERNIA REPAIR VENTRAL ADULT;  Surgeon: Dessa Reyes ORN, MD;  Location: [facility] ORS;  Service: General;  Laterality: N/A;  Shelba Rakers, RNFA to assist    Family History  Problem Relation Age of Onset   Hypertension Mother    Anxiety disorder Mother    Cancer Mother    Depression Mother    Hypertension Father    Arthritis Father     Birth defects Father    Diabetes Father    Hypertension Brother    Cancer Brother 80   Depression Brother    Hypertension Maternal Grandmother    COPD Maternal Grandmother    Diabetes Maternal Grandmother    Kidney disease Maternal Grandmother    Miscarriages / Stillbirths Maternal Grandmother    Stroke Maternal Grandmother    Vision loss Maternal Grandmother    Hypertension Maternal Grandfather    Alcohol abuse Maternal Grandfather    Hypertension Paternal Grandmother    Arthritis Paternal Grandmother    Heart disease Paternal Grandmother    Hypertension Paternal Grandfather    Diabetes Paternal Grandfather    Early death Paternal Grandfather    Diabetes Maternal Uncle    Stroke Maternal Uncle     Social History   Social History Narrative   Works at [employer]    Past Medical History, Surgical History, Social History, Family History, Problem List, Medications, and Allergies have been reviewed and updated if relevant.  Review of Systems: Pertinent positives are listed above.  Otherwise, a full 14 point review of systems has been done in full and it is negative except where it is noted positive.  Objective:   There were no vitals taken for this visit. Ideal Body Weight:   No results found.    10/05/2023    8:02 AM 04/04/2023    3:00 PM 10/10/2021    3:46 PM 07/23/2019    3:44 PM 03/08/2017    9:07 AM  Depression screen PHQ 2/9  Decreased Interest 2 2 0 2 2  Down, Depressed, Hopeless 2 2 0 2 2  PHQ - 2 Score 4 4 0 4 4  Altered sleeping 2 1  1 2   Tired, decreased energy 2 2  2 3   Change in appetite 1 2  1 2   Feeling bad or failure about yourself  1 1  1 1   Trouble concentrating 2 1  1 3   Moving slowly or fidgety/restless 0 0  0 0  Suicidal thoughts 0 0  0 0  PHQ-9 Score 12  11   10  15    Difficult doing work/chores Somewhat difficult Somewhat difficult  Somewhat difficult Somewhat difficult     Data saved with a previous flowsheet row definition     GEN: well  developed, well nourished, no acute distress Eyes: conjunctiva and lids normal, PERRLA, EOMI ENT: TM clear, nares clear, oral exam WNL Neck: supple, no lymphadenopathy, no thyromegaly, no JVD Pulm: clear to auscultation and percussion, respiratory effort normal CV: regular rate and rhythm, S1-S2, no murmur, rub or gallop, no bruits Chest: no scars, masses, no lumps BREAST: breast exam declined GI: soft, non-tender; no hepatosplenomegaly, masses; active bowel sounds all quadrants GU: GU exam declined Lymph: no cervical, axillary or inguinal adenopathy MSK: gait normal, muscle tone and strength WNL, no joint swelling, effusions, discoloration, crepitus  SKIN: clear, good turgor, color  WNL, no rashes, lesions, or ulcerations Neuro: normal mental status, normal strength, sensation, and motion Psych: alert; oriented to person, place and time, normally interactive and not anxious or depressed in appearance.   All labs reviewed with patient. Results for orders placed or performed in visit on 10/05/23  CBC with Differential/Platelet   Collection Time: 10/05/23  8:45 AM  Result Value Ref Range   WBC 5.8 4.0 - 10.5 K/uL   RBC 5.03 3.87 - 5.11 Mil/uL   Hemoglobin 14.5 12.0 - 15.0 g/dL   HCT 56.3 63.9 - 53.9 %   MCV 86.6 78.0 - 100.0 fl   MCHC 33.2 30.0 - 36.0 g/dL   RDW 86.9 88.4 - 84.4 %   Platelets 332.0 150.0 - 400.0 K/uL   Neutrophils Relative % 62.4 43.0 - 77.0 %   Lymphocytes Relative 29.3 12.0 - 46.0 %   Monocytes Relative 6.6 3.0 - 12.0 %   Eosinophils Relative 1.1 0.0 - 5.0 %   Basophils Relative 0.6 0.0 - 3.0 %   Neutro Abs 3.6 1.4 - 7.7 K/uL   Lymphs Abs 1.7 0.7 - 4.0 K/uL   Monocytes Absolute 0.4 0.1 - 1.0 K/uL   Eosinophils Absolute 0.1 0.0 - 0.7 K/uL   Basophils Absolute 0.0 0.0 - 0.1 K/uL  Comprehensive metabolic panel with GFR   Collection Time: 10/05/23  8:45 AM  Result Value Ref Range   Sodium 138 135 - 145 mEq/L   Potassium 3.6 3.5 - 5.1 mEq/L   Chloride 100 96 -  112 mEq/L   CO2 30 19 - 32 mEq/L   Glucose, Bld 99 70 - 99 mg/dL   BUN 14 6 - 23 mg/dL   Creatinine, Ser 9.06 0.40 - 1.20 mg/dL   Total Bilirubin 0.5 0.2 - 1.2 mg/dL   Alkaline Phosphatase 48 39 - 117 U/L   AST 16 0 - 37 U/L   ALT 16 0 - 35 U/L   Total Protein 7.5 6.0 - 8.3 g/dL   Albumin 4.5 3.5 - 5.2 g/dL   GFR 24.25 >39.99 mL/min   Calcium 9.5 8.4 - 10.5 mg/dL  Lipase   Collection Time: 10/05/23  8:45 AM  Result Value Ref Range   Lipase 13.0 11.0 - 59.0 U/L   No results found.  Assessment and Plan:     ICD-10-CM   1. Healthcare maintenance  Z00.00      Assessment & Plan   Health Maintenance Exam: The patient's preventative maintenance and recommended screening tests for an annual wellness exam were reviewed in full today. Brought up to date unless services declined.  Counselled on the importance of diet, exercise, and its role in overall health and mortality. The patient's FH and SH was reviewed, including their home life, tobacco status, and drug and alcohol status.  Follow-up in 1 year for physical exam or additional follow-up below.  Disposition: No follow-ups on file.  Future Appointments  Date Time Provider Department Center  12/17/2023  4:00 PM Talicia Sui, Jacques, MD [facility] [id number]    No orders of the defined types were placed in this encounter.  There are no discontinued medications. No orders of the defined types were placed in this encounter.   Signed,  Jacques DASEN. Vanda Waskey, MD   Allergies as of 12/17/2023       Reactions   Pseudoeph-doxylamine -dm-apap Hives        Medication List        Accurate as of December 16, 2023  2:20 PM. If  you have any questions, ask your nurse or doctor.          buPROPion  150 MG 12 hr tablet Commonly known as: WELLBUTRIN  SR Take 1 tablet (150 mg total) by mouth 2 (two) times daily.   escitalopram  20 MG tablet Commonly known as: LEXAPRO  Take 1 tablet (20 mg total) by mouth daily.   esomeprazole  40  MG capsule Commonly known as: NEXIUM  Take 1 capsule (40 mg total) by mouth 2 (two) times daily 30 minutes before breakfast and supper   gabapentin  100 MG capsule Commonly known as: Neurontin  Follow titration until 3 capsules three times a day is reached.   hydrochlorothiazide  25 MG tablet Commonly known as: HYDRODIURIL  Take 1 tablet (25 mg total) by mouth daily.   losartan  25 MG tablet Commonly known as: COZAAR  Take 1 tablet (25 mg total) by mouth daily.   methocarbamol  500 MG tablet Commonly known as: ROBAXIN  Take 1 tablet (500 mg total) by mouth at bedtime for spasm pain.   multivitamin tablet Take 1 tablet by mouth daily.   sucralfate  1 g tablet Commonly known as: Carafate  Take 1 tablet (1 g total) by mouth 4 (four) times daily -  with meals and at bedtime.   valACYclovir  1000 MG tablet Commonly known as: VALTREX  Take 1 tablet (1,000 mg total) by mouth at bedtime as needed.

## 2023-12-17 ENCOUNTER — Other Ambulatory Visit: Payer: Self-pay

## 2023-12-17 ENCOUNTER — Ambulatory Visit: Admitting: Family Medicine

## 2023-12-17 VITALS — BP 146/90 | HR 108 | Temp 97.1°F | Ht 59.25 in | Wt 248.2 lb

## 2023-12-17 DIAGNOSIS — G4733 Obstructive sleep apnea (adult) (pediatric): Secondary | ICD-10-CM | POA: Diagnosis not present

## 2023-12-17 DIAGNOSIS — E559 Vitamin D deficiency, unspecified: Secondary | ICD-10-CM

## 2023-12-17 DIAGNOSIS — Z0000 Encounter for general adult medical examination without abnormal findings: Secondary | ICD-10-CM

## 2023-12-17 DIAGNOSIS — Z131 Encounter for screening for diabetes mellitus: Secondary | ICD-10-CM | POA: Diagnosis not present

## 2023-12-17 DIAGNOSIS — Z79899 Other long term (current) drug therapy: Secondary | ICD-10-CM

## 2023-12-17 DIAGNOSIS — Z13220 Encounter for screening for lipoid disorders: Secondary | ICD-10-CM | POA: Diagnosis not present

## 2023-12-17 MED ORDER — HYDROCHLOROTHIAZIDE 25 MG PO TABS
25.0000 mg | ORAL_TABLET | Freq: Every day | ORAL | 3 refills | Status: AC
  Filled 2023-12-17: qty 90, 90d supply, fill #0

## 2023-12-17 MED ORDER — LOSARTAN POTASSIUM 50 MG PO TABS
50.0000 mg | ORAL_TABLET | Freq: Every day | ORAL | 3 refills | Status: AC
  Filled 2023-12-17: qty 90, 90d supply, fill #0

## 2023-12-17 MED ORDER — ESCITALOPRAM OXALATE 10 MG PO TABS
10.0000 mg | ORAL_TABLET | Freq: Every day | ORAL | 2 refills | Status: DC
  Filled 2023-12-17: qty 30, 30d supply, fill #0

## 2023-12-17 MED ORDER — VENLAFAXINE HCL ER 75 MG PO CP24
75.0000 mg | ORAL_CAPSULE | Freq: Every day | ORAL | 3 refills | Status: AC
  Filled 2023-12-17: qty 30, 30d supply, fill #0
  Filled 2024-02-01: qty 30, 30d supply, fill #1

## 2023-12-17 MED ORDER — PANTOPRAZOLE SODIUM 40 MG PO TBEC
40.0000 mg | DELAYED_RELEASE_TABLET | Freq: Every day | ORAL | 3 refills | Status: AC
  Filled 2023-12-17: qty 90, 90d supply, fill #0

## 2023-12-17 NOTE — Patient Instructions (Addendum)
 Lexapro  - start 10 mg once a day for 2 weeks, then drop to 5 mg (1/2) tablet once a day for 2 weeks  When you drop the Lexapro  dose to 5 mg a day, start Venlafaxime 75 mg once a day

## 2023-12-18 ENCOUNTER — Encounter: Payer: Self-pay | Admitting: Family Medicine

## 2023-12-18 ENCOUNTER — Ambulatory Visit: Payer: Self-pay | Admitting: Family Medicine

## 2023-12-18 LAB — HEPATIC FUNCTION PANEL
ALT: 19 U/L (ref 0–35)
AST: 18 U/L (ref 5–37)
Albumin: 4.4 g/dL (ref 3.5–5.2)
Alkaline Phosphatase: 54 U/L (ref 39–117)
Bilirubin, Direct: 0 mg/dL (ref 0.0–0.3)
Total Bilirubin: 0.3 mg/dL (ref 0.2–1.2)
Total Protein: 6.7 g/dL (ref 6.0–8.3)

## 2023-12-18 LAB — CBC WITH DIFFERENTIAL/PLATELET
Basophils Absolute: 0.1 K/uL (ref 0.0–0.1)
Basophils Relative: 1.1 % (ref 0.0–3.0)
Eosinophils Absolute: 0.1 K/uL (ref 0.0–0.7)
Eosinophils Relative: 1 % (ref 0.0–5.0)
HCT: 39.7 % (ref 36.0–46.0)
Hemoglobin: 13.5 g/dL (ref 12.0–15.0)
Lymphocytes Relative: 23.9 % (ref 12.0–46.0)
Lymphs Abs: 2.1 K/uL (ref 0.7–4.0)
MCHC: 33.9 g/dL (ref 30.0–36.0)
MCV: 85 fl (ref 78.0–100.0)
Monocytes Absolute: 0.6 K/uL (ref 0.1–1.0)
Monocytes Relative: 6.7 % (ref 3.0–12.0)
Neutro Abs: 6 K/uL (ref 1.4–7.7)
Neutrophils Relative %: 67.3 % (ref 43.0–77.0)
Platelets: 359 K/uL (ref 150.0–400.0)
RBC: 4.67 Mil/uL (ref 3.87–5.11)
RDW: 13.5 % (ref 11.5–15.5)
WBC: 8.9 K/uL (ref 4.0–10.5)

## 2023-12-18 LAB — LIPID PANEL
Cholesterol: 251 mg/dL — ABNORMAL HIGH (ref 28–200)
HDL: 69.4 mg/dL (ref >39.00)
LDL Cholesterol: 117 mg/dL — ABNORMAL HIGH (ref 0–99)
NonHDL: 181.72
Total CHOL/HDL Ratio: 4
Triglycerides: 325 mg/dL — ABNORMAL HIGH (ref 0.0–149.0)
VLDL: 65 mg/dL — ABNORMAL HIGH (ref 0.0–40.0)

## 2023-12-18 LAB — BASIC METABOLIC PANEL WITH GFR
BUN: 15 mg/dL (ref 6–23)
CO2: 30 meq/L (ref 19–32)
Calcium: 9.9 mg/dL (ref 8.4–10.5)
Chloride: 99 meq/L (ref 96–112)
Creatinine, Ser: 0.93 mg/dL (ref 0.40–1.20)
GFR: 75.63 mL/min (ref >60.00)
Glucose, Bld: 79 mg/dL (ref 70–99)
Potassium: 3.8 meq/L (ref 3.5–5.1)
Sodium: 138 meq/L (ref 135–145)

## 2023-12-18 LAB — HEMOGLOBIN A1C: Hgb A1c MFr Bld: 5.6 % (ref 4.6–6.5)

## 2023-12-18 LAB — TSH: TSH: 2.09 u[IU]/mL (ref 0.35–5.50)

## 2023-12-18 LAB — VITAMIN D 25 HYDROXY (VIT D DEFICIENCY, FRACTURES): VITD: 39.39 ng/mL (ref 30.00–100.00)

## 2023-12-25 ENCOUNTER — Ambulatory Visit: Admitting: Sleep Medicine

## 2024-01-07 ENCOUNTER — Ambulatory Visit: Admitting: Sleep Medicine

## 2024-01-07 ENCOUNTER — Encounter: Payer: Self-pay | Admitting: Sleep Medicine

## 2024-01-07 VITALS — BP 130/80 | HR 92 | Temp 98.7°F | Ht 59.0 in | Wt 249.4 lb

## 2024-01-07 DIAGNOSIS — Z6841 Body Mass Index (BMI) 40.0 and over, adult: Secondary | ICD-10-CM | POA: Diagnosis not present

## 2024-01-07 DIAGNOSIS — G4710 Hypersomnia, unspecified: Secondary | ICD-10-CM

## 2024-01-07 DIAGNOSIS — Z87891 Personal history of nicotine dependence: Secondary | ICD-10-CM

## 2024-01-07 DIAGNOSIS — G4733 Obstructive sleep apnea (adult) (pediatric): Secondary | ICD-10-CM

## 2024-01-07 DIAGNOSIS — I10 Essential (primary) hypertension: Secondary | ICD-10-CM | POA: Diagnosis not present

## 2024-01-07 NOTE — Progress Notes (Signed)
 "      Name:Lacey Rodgers MRN: [id number] DOB: Apr 13, 1980   CHIEF COMPLAINT:  EXCESSIVE DAYTIME SLEEPINESS   HISTORY OF PRESENT ILLNESS: Lacey Rodgers is a 44 y.o. w/ a h/o HTN, migraine headaches, anxiety, depression and morbid obesity who presents for c/o loud snoring and excessive daytime sleepiness which has been present for several years. Reports nocturnal awakenings due to nocturia, however does not have difficulty falling back to sleep. Reports significant weight changes. Admits to occasional headaches and night sweats. Denies RLS symptoms, dream enactment, cataplexy, hypnagogic or hypnapompic hallucinations. Reports a family history of sleep apnea. Reports drowsy driving. Drinks 2 cups of coffee daily, occasional alcohol use, denies tobacco or illicit drug use.   Bedtime 10-11 pm Sleep onset 10 mins Rise time 5 am   EPWORTH SLEEP SCORE 14    01/07/2024   10:00 AM  Results of the Epworth flowsheet  Sitting and reading 3  Watching TV 3  Sitting, inactive in a public place (e.g. a theatre or a meeting) 2  As a passenger in a car for an hour without a break 3  Lying down to rest in the afternoon when circumstances permit 1  Sitting and talking to someone 0  Sitting quietly after a lunch without alcohol 1  In a car, while stopped for a few minutes in traffic 1  Total score 14    PAST MEDICAL HISTORY :   has a past medical history of Allergic rhinitis, Allergy, Depression, GAD (generalized anxiety disorder), Genital herpes, GERD (gastroesophageal reflux disease), History of kidney stones, Hypertension, Migraine, and Morbid obesity with BMI of 45.0-49.9, adult (HCC).  has a past surgical history that includes Dilation and curettage of uterus (01/2013); Cesarean section (N/A, 12/16/2015); Cesarean section with bilateral tubal ligation (12/02/2018); Esophagogastroduodenoscopy (N/A, 02/17/2022); Hernia repair (07/2021); Tubal ligation (12/02/2018); Ventral hernia repair (N/A,  07/18/2021); and Insertion of mesh (07/18/2021). Prior to Admission medications  Medication Sig Start Date End Date Taking? Authorizing Provider  buPROPion  (WELLBUTRIN  SR) 150 MG 12 hr tablet Take 1 tablet (150 mg total) by mouth 2 (two) times daily. 12/13/22  Yes Copland, Jacques, MD  escitalopram  (LEXAPRO ) 10 MG tablet Take 1 tablet (10 mg total) by mouth daily. 12/17/23  Yes Copland, Jacques, MD  gabapentin  (NEURONTIN ) 100 MG capsule Follow titration until 3 capsules three times a day is reached. 07/19/20  Yes Copland, Jacques, MD  hydrochlorothiazide  (HYDRODIURIL ) 25 MG tablet Take 1 tablet (25 mg total) by mouth daily. 12/17/23  Yes Copland, Jacques, MD  losartan  (COZAAR ) 50 MG tablet Take 1 tablet (50 mg total) by mouth daily. 12/17/23  Yes Copland, Jacques, MD  methocarbamol  (ROBAXIN ) 500 MG tablet Take 1 tablet (500 mg total) by mouth at bedtime for spasm pain. 07/16/23  Yes Armida Culver, PA-C  Multiple Vitamin (MULTIVITAMIN) tablet Take 1 tablet by mouth daily.   Yes [provider]  pantoprazole  (PROTONIX ) 40 MG tablet Take 1 tablet (40 mg total) by mouth daily. 12/17/23  Yes Copland, Jacques, MD  sucralfate  (CARAFATE ) 1 g tablet Take 1 tablet (1 g total) by mouth 4 (four) times daily -  with meals and at bedtime. 10/05/23  Yes Cleatus Arlyss RAMAN, MD  valACYclovir  (VALTREX ) 1000 MG tablet Take 1 tablet (1,000 mg total) by mouth at bedtime as needed. 10/05/23  Yes Cleatus Arlyss RAMAN, MD  venlafaxine  XR (EFFEXOR  XR) 75 MG 24 hr capsule Take 1 capsule (75 mg total) by mouth daily with breakfast. 12/17/23  Yes Copland, Jacques, MD  Allergies[1]  FAMILY HISTORY:  family history includes Alcohol abuse in her maternal grandfather; Anxiety disorder in her mother; Arthritis in her father and paternal grandmother; Birth defects in her father; COPD in her maternal grandmother; Cancer in her mother; Cancer (age of onset: 62) in her brother; Depression in her brother and mother; Diabetes in her  father, maternal grandmother, maternal uncle, and paternal grandfather; Early death in her paternal grandfather; Heart disease in her paternal grandmother; Hypertension in her brother, father, maternal grandfather, maternal grandmother, mother, paternal grandfather, and paternal grandmother; Kidney disease in her maternal grandmother; Miscarriages / Stillbirths in her maternal grandmother; Stroke in her maternal grandmother and maternal uncle; Vision loss in her maternal grandmother. SOCIAL HISTORY:  reports that she has quit smoking. Her smoking use included cigarettes. She has a 1.5 pack-year smoking history. She has never used smokeless tobacco. She reports current alcohol use. She reports that she does not use drugs.   Review of Systems:  Gen:  Denies  fever, sweats, chills weight loss  HEENT: Denies blurred vision, double vision, ear pain, eye pain, hearing loss, nose bleeds, sore throat Cardiac:  No dizziness, chest pain or heaviness, chest tightness,edema, No JVD Resp:   No cough, -sputum production, -shortness of breath,-wheezing, -hemoptysis,  Gi: Denies swallowing difficulty, stomach pain, nausea or vomiting, diarrhea, constipation, bowel incontinence Gu:  Denies bladder incontinence, burning urine Ext:   Denies Joint pain, stiffness or swelling Skin: Denies  skin rash, easy bruising or bleeding or hives Endoc:  Denies polyuria, polydipsia , polyphagia or weight change Psych:   Denies depression, insomnia or hallucinations  Other:  All other systems negative  VITAL SIGNS: BP 130/80   Pulse 92   Temp 98.7 F (37.1 C)   Ht 4' 11 (1.499 m)   Wt 249 lb 6.4 oz (113.1 kg)   LMP 12/02/2023 (Approximate)   SpO2 96%   BMI 50.37 kg/m    Physical Examination:   General Appearance: No distress  EYES PERRLA, EOM intact.   NECK Supple, No JVD Pulmonary: normal breath sounds, No wheezing.  CardiovascularNormal S1,S2.  No m/r/g.   Abdomen: Benign, Soft, non-tender. Skin:   warm, no  rashes, no ecchymosis  Extremities: normal, no cyanosis, clubbing. Neuro:without focal findings,  speech normal  PSYCHIATRIC: Mood, affect within normal limits.   ASSESSMENT AND PLAN  OSA I suspect that OSA is likely present due to clinical presentation. Discussed the consequences of untreated sleep apnea. Advised not to drive drowsy for safety of patient and others. Will complete further evaluation with a home sleep study and follow up to review results.    HTN Stable, on current management. Following with PCP.   Morbid obesity Counseled patient on diet and lifestyle modification.    MEDICATION ADJUSTMENTS/LABS AND TESTS ORDERED: Recommend Sleep Study   Patient  satisfied with Plan of action and management. All questions answered  Follow up to review HST results and treatment plan.   I spent a total of 49 minutes reviewing chart data, face-to-face evaluation with the patient, counseling and coordination of care as detailed above.    Woods Gangemi, M.D.  Sleep Medicine [facility]           [1]  Allergies Allergen Reactions   Pseudoeph-Doxylamine -Dm-Apap Hives   Nickel Itching, Rash and Swelling   "

## 2024-01-07 NOTE — Patient Instructions (Signed)
 Lacey Rodgers

## 2024-01-24 ENCOUNTER — Encounter

## 2024-01-24 DIAGNOSIS — G4733 Obstructive sleep apnea (adult) (pediatric): Secondary | ICD-10-CM

## 2024-01-28 ENCOUNTER — Ambulatory Visit: Admitting: Family Medicine

## 2024-01-29 NOTE — Progress Notes (Unsigned)
" ° ° ° °  Safiyah Cisney T. Marlys Stegmaier, MD, CAQ Sports Medicine [facility] at [facility] [address] [place], [address]  Phone: [phone]  FAX: [phone]  Lacey Rodgers - 44 y.o. female  MRN [id number]  Date of Birth: [birth year]  Date: 01/30/2024  PCP: Watt Mirza, MD  Referral: Watt Mirza, MD  No chief complaint on file.  Subjective:   Lacey Rodgers is a 44 y.o. very pleasant female patient with There is no height or weight on file to calculate BMI. who presents with the following:  Discussed the use of AI scribe software for clinical note transcription with the patient, who gave verbal consent to proceed.  Very well-known patient presents for depression and high blood pressure follow-up.  Hypertension: Hydrochlorothiazide  25 mg, losartan  50 mg  Depression: Effexor  XR 75 mg, Lexapro  10 mg, Wellbutrin  150 mg p.o. twice daily.  The last time she was in the office, we were going to titrate her off of Lexapro  and start her on Effexor  75 mg when she has been titrated off of the Lexapro . History of Present Illness     Review of Systems is noted in the HPI, as appropriate  Objective:   There were no vitals taken for this visit.  GEN: No acute distress; alert,appropriate. PULM: Breathing comfortably in no respiratory distress PSYCH: Normally interactive.   Laboratory and Imaging Data:  Assessment and Plan:   No diagnosis found. Assessment & Plan   Medication Management during today's office visit: No orders of the defined types were placed in this encounter.  There are no discontinued medications.  Orders placed today for conditions managed today: No orders of the defined types were placed in this encounter.   Disposition: No follow-ups on file.  [employer] speech-to-text software was used for transcription in this dictation.  Possible transcriptional errors can occur using Animal nutritionist.   Signed,  Lacey Rodgers.  Sherita Decoste, MD   Outpatient Encounter Medications as of 01/30/2024  Medication Sig   buPROPion  (WELLBUTRIN  SR) 150 MG 12 hr tablet Take 1 tablet (150 mg total) by mouth 2 (two) times daily.   escitalopram  (LEXAPRO ) 10 MG tablet Take 1 tablet (10 mg total) by mouth daily.   gabapentin  (NEURONTIN ) 100 MG capsule Follow titration until 3 capsules three times a day is reached.   hydrochlorothiazide  (HYDRODIURIL ) 25 MG tablet Take 1 tablet (25 mg total) by mouth daily.   losartan  (COZAAR ) 50 MG tablet Take 1 tablet (50 mg total) by mouth daily.   methocarbamol  (ROBAXIN ) 500 MG tablet Take 1 tablet (500 mg total) by mouth at bedtime for spasm pain.   Multiple Vitamin (MULTIVITAMIN) tablet Take 1 tablet by mouth daily.   pantoprazole  (PROTONIX ) 40 MG tablet Take 1 tablet (40 mg total) by mouth daily.   sucralfate  (CARAFATE ) 1 g tablet Take 1 tablet (1 g total) by mouth 4 (four) times daily -  with meals and at bedtime.   valACYclovir  (VALTREX ) 1000 MG tablet Take 1 tablet (1,000 mg total) by mouth at bedtime as needed.   venlafaxine  XR (EFFEXOR  XR) 75 MG 24 hr capsule Take 1 capsule (75 mg total) by mouth daily with breakfast.   No facility-administered encounter medications on file as of 01/30/2024.   "

## 2024-01-30 ENCOUNTER — Encounter: Payer: Self-pay | Admitting: Family Medicine

## 2024-01-30 ENCOUNTER — Ambulatory Visit: Admitting: Family Medicine

## 2024-01-30 VITALS — BP 120/80 | HR 93 | Temp 98.2°F | Ht 59.25 in | Wt 238.2 lb

## 2024-01-30 DIAGNOSIS — F331 Major depressive disorder, recurrent, moderate: Secondary | ICD-10-CM | POA: Diagnosis not present

## 2024-01-30 DIAGNOSIS — I10 Essential (primary) hypertension: Secondary | ICD-10-CM | POA: Diagnosis not present

## 2024-02-01 ENCOUNTER — Other Ambulatory Visit: Payer: Self-pay
